# Patient Record
Sex: Female | Born: 1950
Health system: Southern US, Community
[De-identification: ages and names within clinical notes are randomized; demographics above are authoritative.]

## PROBLEM LIST (undated history)

## (undated) DIAGNOSIS — Z808 Family history of malignant neoplasm of other organs or systems: Secondary | ICD-10-CM

## (undated) DIAGNOSIS — T4145XA Adverse effect of unspecified anesthetic, initial encounter: Secondary | ICD-10-CM

## (undated) DIAGNOSIS — R112 Nausea with vomiting, unspecified: Secondary | ICD-10-CM

## (undated) DIAGNOSIS — Z131 Encounter for screening for diabetes mellitus: Secondary | ICD-10-CM

## (undated) DIAGNOSIS — M5030 Other cervical disc degeneration, unspecified cervical region: Secondary | ICD-10-CM

## (undated) DIAGNOSIS — F0390 Unspecified dementia without behavioral disturbance: Secondary | ICD-10-CM

## (undated) DIAGNOSIS — N63 Unspecified lump in unspecified breast: Secondary | ICD-10-CM

## (undated) DIAGNOSIS — I10 Essential (primary) hypertension: Secondary | ICD-10-CM

## (undated) DIAGNOSIS — Z801 Family history of malignant neoplasm of trachea, bronchus and lung: Secondary | ICD-10-CM

## (undated) DIAGNOSIS — Z800 Family history of malignant neoplasm of digestive organs: Secondary | ICD-10-CM

## (undated) DIAGNOSIS — T8859XA Other complications of anesthesia, initial encounter: Secondary | ICD-10-CM

## (undated) DIAGNOSIS — Z9889 Other specified postprocedural states: Secondary | ICD-10-CM

## (undated) DIAGNOSIS — B382 Pulmonary coccidioidomycosis, unspecified: Secondary | ICD-10-CM

## (undated) DIAGNOSIS — H409 Unspecified glaucoma: Secondary | ICD-10-CM

## (undated) DIAGNOSIS — Z803 Family history of malignant neoplasm of breast: Secondary | ICD-10-CM

## (undated) DIAGNOSIS — Z923 Personal history of irradiation: Secondary | ICD-10-CM

## (undated) DIAGNOSIS — F329 Major depressive disorder, single episode, unspecified: Secondary | ICD-10-CM

## (undated) DIAGNOSIS — G4730 Sleep apnea, unspecified: Secondary | ICD-10-CM

## (undated) DIAGNOSIS — R911 Solitary pulmonary nodule: Secondary | ICD-10-CM

## (undated) HISTORY — DX: Family history of malignant neoplasm of trachea, bronchus and lung: Z80.1

## (undated) HISTORY — DX: Encounter for screening for diabetes mellitus: Z13.1

## (undated) HISTORY — PX: UVULOPALATOPHARYNGOPLASTY: SHX827

## (undated) HISTORY — DX: Family history of malignant neoplasm of digestive organs: Z80.0

## (undated) HISTORY — DX: Family history of malignant neoplasm of other organs or systems: Z80.8

## (undated) HISTORY — DX: Unspecified dementia, unspecified severity, without behavioral disturbance, psychotic disturbance, mood disturbance, and anxiety: F03.90

## (undated) HISTORY — DX: Sleep apnea, unspecified: G47.30

## (undated) HISTORY — DX: Unspecified glaucoma: H40.9

## (undated) HISTORY — DX: Other cervical disc degeneration, unspecified cervical region: M50.30

## (undated) HISTORY — PX: CHOLECYSTECTOMY: SHX55

## (undated) HISTORY — DX: Essential (primary) hypertension: I10

## (undated) HISTORY — PX: LAPAROSCOPIC GASTRIC SLEEVE RESECTION: SHX5895

## (undated) HISTORY — DX: Family history of malignant neoplasm of breast: Z80.3

## (undated) HISTORY — PX: TUBAL LIGATION: SHX77

## (undated) HISTORY — DX: Pulmonary coccidioidomycosis, unspecified: B38.2

## (undated) HISTORY — DX: Major depressive disorder, single episode, unspecified: F32.9

## (undated) HISTORY — DX: Morbid (severe) obesity due to excess calories: E66.01

## (undated) HISTORY — PX: ABDOMINAL HYSTERECTOMY: SHX81

## (undated) NOTE — *Deleted (*Deleted)
SURVIVORSHIP VIRTUAL VISIT:  I connected with *** on 01/09/20 at  8:30 AM EDT by *** and verified that I am speaking with the correct person using two identifiers.  I discussed the limitations, risks, security and privacy concerns of performing an evaluation and management service by telephone and the availability of in person appointments. I also discussed with the patient that there may be a patient responsible charge related to this service. The patient expressed understanding and agreed to proceed.   BRIEF ONCOLOGIC HISTORY:  Oncology History  Ductal carcinoma in situ (DCIS) of right breast  12/27/2018 Initial Diagnosis   Patient noted bilateral dark red spontaneous nipple discharge and a left breast lump palpable on exam. Mammogram showed calcifications in the right breast spanning 3.68mm and no correlate for the previously palpable left breast lump. Breast MRI showed no evidence of malignancy bilaterally. Right breast biopsy showed intermediate grade DCIS, ER+ 100%, PR+ 70%.    03/01/2019 Genetic Testing   Negative genetic testing:  No pathogenic variants detected on the [facility] Breast Cancer STAT panel or the [facility] Multi-Cancer panel. A variant of uncertain significance was detected in the APC gene called c.449A>G. The report date is 03/01/2019.  The Breast Cancer STAT panel offered by [facility] includes sequencing and rearrangement analysis for the following 9 genes:  ATM, BRCA1, BRCA2, CDH1, CHEK2, PALB2, PTEN, STK11 and TP53.  The Multi-Cancer Panel offered by [facility] includes sequencing and/or deletion duplication testing of the following 85 genes: AIP, ALK, APC, ATM, AXIN2,BAP1,  BARD1, BLM, BMPR1A, BRCA1, BRCA2, BRIP1, CASR, CDC73, CDH1, CDK4, CDKN1B, CDKN1C, CDKN2A (p14ARF), CDKN2A (p16INK4a), CEBPA, CHEK2, CTNNA1, DICER1, DIS3L2, EGFR (c.2369C>T, p.Thr790Met variant only), EPCAM (Deletion/duplication testing only), FH, FLCN, GATA2, GPC3, GREM1 (Promoter region deletion/duplication testing  only), HOXB13 (c.251G>A, p.Gly84Glu), HRAS, KIT, MAX, MEN1, MET, MITF (c.952G>A, p.Glu318Lys variant only), MLH1, MSH2, MSH3, MSH6, MUTYH, NBN, NF1, NF2, NTHL1, PALB2, PDGFRA, PHOX2B, PMS2, POLD1, POLE, POT1, PRKAR1A, PTCH1, PTEN, RAD50, RAD51C, RAD51D, RB1, RECQL4, RET, RNF43, RUNX1, SDHAF2, SDHA (sequence changes only), SDHB, SDHC, SDHD, SMAD4, SMARCA4, SMARCB1, SMARCE1, STK11, SUFU, TERC, TERT, TMEM127, TP53, TSC1, TSC2, VHL, WRN and WT1.     04/28/2019 Cancer Staging   Staging form: Breast, AJCC 8th Edition - Pathologic stage from 04/28/2019: Stage 0 (pTis (DCIS), pN0, cM0, ER+, PR+)   04/28/2019 Surgery   Right lumpectomy [name]) [phone]): intermediate grade DCIS, clear margins. No regional lymph nodes were examined.   06/16/2019 - 07/11/2019 Radiation Therapy   Radiation at [facility].   07/2019 - 07/2024 Anti-estrogen oral therapy   Tamoxifen     INTERVAL HISTORY:  Ms. Polcyn to review her survivorship care plan detailing her treatment course for breast cancer, as well as monitoring long-term side effects of that treatment, education regarding health maintenance, screening, and overall wellness and health promotion.     Overall, Ms. Blumberg reports feeling quite well.  She continues on Tamoxifen daily and is tolerating it moderately well.  She completed a survivorship survey today.    REVIEW OF SYSTEMS:  Review of Systems  Constitutional: Negative for appetite change, chills, fatigue, fever and unexpected weight change.  HENT:   Negative for hearing loss, lump/mass, sore throat and trouble swallowing.   Eyes: Negative for eye problems and icterus.  Respiratory: Negative for chest tightness, cough and shortness of breath.   Cardiovascular: Negative for chest pain, leg swelling and palpitations.  Gastrointestinal: Negative for abdominal distention, abdominal pain, constipation, diarrhea, nausea and vomiting.  Endocrine: Negative for hot flashes.  Genitourinary: Negative for  difficulty  urinating.   Musculoskeletal: Negative for arthralgias.  Skin: Negative for itching and rash.  Neurological: Negative for dizziness, extremity weakness, headaches and numbness.  Hematological: Negative for adenopathy. Does not bruise/bleed easily.  Psychiatric/Behavioral: Negative for depression. The patient is not nervous/anxious.    Breast: Denies any new nodularity, masses, tenderness, nipple changes, or nipple discharge.      ONCOLOGY TREATMENT TEAM:  1. Surgeon:  Dr. [name] at [facility] 2. Medical Oncologist: Dr. Pamelia Hoit  3. Radiation completed in [place]    PAST MEDICAL/SURGICAL HISTORY:  Past Medical History:  Diagnosis Date  . Breast mass 12/27/2018  . Coccidioidomycosis, pulmonary (HCC)   . Complication of anesthesia   . DDD (degenerative disc disease), cervical   . Dementia (HCC)    Early per Dr. Juanetta Gosling  . Depression 2009  . DM (diabetes mellitus screen) 2009  . Family history of bone cancer   . Family history of brain cancer   . Family history of breast cancer   . Family history of esophageal cancer   . Family history of lung cancer   . Glaucoma   . HTN (hypertension)   . Morbid obesity (HCC)   . Nodule of upper lobe of left lung 12/27/2018  . PONV (postoperative nausea and vomiting)   . Sleep apnea    had surgery  . Type II diabetes mellitus, uncontrolled (HCC) 01/31/2019   Past Surgical History:  Procedure Laterality Date  . ABDOMINAL HYSTERECTOMY     TAH BSO  . BREAST BIOPSY Right   . BREAST LUMPECTOMY WITH RADIOACTIVE SEED LOCALIZATION Right 04/28/2019   Procedure: RIGHT BREAST LUMPECTOMY WITH RADIOACTIVE SEED LOCALIZATION;  Surgeon: Griselda Miner, MD;  Location: [facility];  Service: General;  Laterality: Right;  . CHOLECYSTECTOMY    . TUBAL LIGATION    . UVULOPALATOPHARYNGOPLASTY       ALLERGIES:  Allergies  Allergen Reactions  . Oxycodone-Acetaminophen Other (See Comments), Itching, Rash and Hives    Other  reaction(s): Hallucinations  . Propoxyphene Other (See Comments), Hives, Itching and Rash    Other reaction(s): Delusions (intolerance)  . Demerol [Meperidine] Other (See Comments)    hallucinations  . Morphine And Related Hives  . Oxycodone-Aspirin Other (See Comments) and Hives  . Chlorhexidine Rash     CURRENT MEDICATIONS:  Outpatient Encounter Medications as of 01/11/2020  Medication Sig  . aspirin 81 MG tablet Take 1 tablet by mouth daily.  . Calcium Carbonate-Vit D-Min (CALCIUM 1200 PO) Take 1,200 mg by mouth 2 (two) times daily.  . Cholecalciferol (VITAMIN D-3) 25 MCG (1000 UT) CAPS Take 3 capsules by mouth daily.  . insulin aspart (NOVOLOG) 100 UNIT/ML injection Inject 5-15 Units into the skin 3 (three) times daily with meals. 5-15 units on sliding scale   . insulin glargine (LANTUS) 100 UNIT/ML injection Inject 60 Units into the skin at bedtime.   Marland Kitchen lisinopril-hydrochlorothiazide (ZESTORETIC) 10-12.5 MG tablet Take 1 tablet by mouth daily.  . metFORMIN (GLUCOPHAGE-XR) 500 MG 24 hr tablet Take 500 mg by mouth daily with breakfast.  . Multiple Vitamins-Minerals (MULTIVITAMIN ADULT PO) Take by mouth daily.  . Pediatric Multivitamins-Iron Kirke Corin W/IRON PO) Take 2 tablets by mouth daily.  . tamoxifen (NOLVADEX) 20 MG tablet Take 1 tablet (20 mg total) by mouth daily.  . traMADol (ULTRAM) 50 MG tablet Take 1-2 tablets (50-100 mg total) by mouth every 6 (six) hours as needed.  . venlafaxine XR (EFFEXOR-XR) 75 MG 24 hr capsule Take 75 mg  by mouth daily.  . vitamin B-12 (CYANOCOBALAMIN) 500 MCG tablet Take 1,000 mcg by mouth 2 (two) times daily.   No facility-administered encounter medications on file as of 01/11/2020.     ONCOLOGIC FAMILY HISTORY:  Family History  Problem Relation Age of Onset  . Heart disease Mother        "enlarged heart and leaking valves"  . COPD Mother   . Diabetes Mother   . Breast cancer Mother 29  . Cancer Mother        cancer involving the  colon, esophagus, and liver  . Peripheral vascular disease Father 56  . Heart disease Father   . Diabetes Maternal Grandfather   . Polycystic kidney disease Sister   . Polycystic kidney disease Brother   . Hypertension Daughter   . Breast cancer Maternal Grandmother 80  . Breast cancer Other 72       maternal grandmother's sister  . Brain cancer Other 68  . Lung cancer Other      GENETIC COUNSELING/TESTING: See above  SOCIAL HISTORY:  Social History   Socioeconomic History  . Marital status: Widowed    Spouse name: Not on file  . Number of children: 3  . Years of education: Not on file  . Highest education level: Not on file  Occupational History  . Occupation: Retired-Daycare  Tobacco Use  . Smoking status: Never Smoker  . Smokeless tobacco: Never Used  Vaping Use  . Vaping Use: Never used  Substance and Sexual Activity  . Alcohol use: Yes    Comment: social  . Drug use: No  . Sexual activity: Not Currently    Birth control/protection: Surgical  Other Topics Concern  . Not on file  Social History Narrative   Lives at home with children. Married for 48 years.Lives with husband.Retired.   Social Determinants of Health   Financial Resource Strain:   . Difficulty of Paying Living Expenses: Not on file  Food Insecurity:   . Worried About Programme researcher, broadcasting/film/video in the Last Year: Not on file  . Ran Out of Food in the Last Year: Not on file  Transportation Needs:   . Lack of Transportation (Medical): Not on file  . Lack of Transportation (Non-Medical): Not on file  Physical Activity:   . Days of Exercise per Week: Not on file  . Minutes of Exercise per Session: Not on file  Stress:   . Feeling of Stress : Not on file  Social Connections:   . Frequency of Communication with Friends and Family: Not on file  . Frequency of Social Gatherings with Friends and Family: Not on file  . Attends Religious Services: Not on file  . Active Member of Clubs or Organizations: Not  on file  . Attends Banker Meetings: Not on file  . Marital Status: Not on file  Intimate Partner Violence:   . Fear of Current or Ex-Partner: Not on file  . Emotionally Abused: Not on file  . Physically Abused: Not on file  . Sexually Abused: Not on file     OBSERVATIONS/OBJECTIVE:  There were no vitals taken for this visit. GENERAL: Patient is a well appearing female in no acute distress HEENT:  Sclerae anicteric.  Oropharynx clear and moist. No ulcerations or evidence of oropharyngeal candidiasis. Neck is supple.  NODES:  No cervical, supraclavicular, or axillary lymphadenopathy palpated.  BREAST EXAM:  Right breast s/p lumpectomy and radiation therapy, no sign of local recurrence, left breast benign  LUNGS:  Clear to auscultation bilaterally.  No wheezes or rhonchi. HEART:  Regular rate and rhythm. No murmur appreciated. ABDOMEN:  Soft, nontender.  Positive, normoactive bowel sounds. No organomegaly palpated. MSK:  No focal spinal tenderness to palpation. Full range of motion bilaterally in the upper extremities. EXTREMITIES:  No peripheral edema.   SKIN:  Clear with no obvious rashes or skin changes. No nail dyscrasia. NEURO:  Nonfocal. Well oriented.  Appropriate affect.    LABORATORY DATA:  None for this visit.  DIAGNOSTIC IMAGING:  None for this visit.      ASSESSMENT AND PLAN:  Ms.. Ulrey is a pleasant 36 y.o. female with Stage *** right/left breast invasive ductal carcinoma, ER+/PR+/HER2-, diagnosed in ***, treated with lumpectomy, adjuvant radiation therapy, and anti-estrogen therapy with *** beginning in ***.  She presents to the Survivorship Clinic for our initial meeting and routine follow-up post-completion of treatment for breast cancer.    1. Stage *** right/left breast cancer:  Ms. Feild is continuing to recover from definitive treatment for breast cancer. She will follow-up with her medical oncologist, Dr. Lucrezia Starch in *** with history  and physical exam per surveillance protocol.  She will continue her anti-estrogen therapy with ***. Thus far, she is tolerating the *** well, with minimal side effects. She was instructed to make Dr. Pamelia Hoit or myself aware if she begins to experience any worsening side effects of the medication and I could see her back in clinic to help manage those side effects, as needed. Her mammogram is due ***; orders placed today.  Her breast density is category ***. Today, a comprehensive survivorship care plan and treatment summary was reviewed with the patient today detailing her breast cancer diagnosis, treatment course, potential late/long-term effects of treatment, appropriate follow-up care with recommendations for the future, and patient education resources.  A copy of this summary, along with a letter will be sent to the patient's primary care provider via mail/fax/In Basket message after today's visit.    #. Problem(s) at Visit______________  #. Bone health:  Given Ms. Tomei age/history of breast cancer and her current treatment regimen including anti-estrogen therapy with ***, she is at risk for bone demineralization.  Her last DEXA scan was ***, which showed ***.  In the meantime, she was encouraged to increase her consumption of foods rich in calcium, as well as increase her weight-bearing activities.  She was given education on specific activities to promote bone health.  #. Cancer screening:  Due to Ms. Doering history and her age, she should receive screening for skin cancers, colon cancer, and gynecologic cancers.  The information and recommendations are listed on the patient's comprehensive care plan/treatment summary and were reviewed in detail with the patient.    #. Health maintenance and wellness promotion: Ms. Soman was encouraged to consume 5-7 servings of fruits and vegetables per day. We reviewed the "Nutrition Rainbow" handout, as well as the handout "Take Control of Your Health and Reduce  Your Cancer Risk" from the American Cancer Society.  She was also encouraged to engage in moderate to vigorous exercise for 30 minutes per day most days of the week. We discussed the [employer] fitness program, which is designed for cancer survivors to help them become more physically fit after cancer treatments.  She was instructed to limit her alcohol consumption and continue to abstain from tobacco use/***was encouraged stop smoking.     #. Support services/counseling: It is not uncommon for this period of the patient's cancer care  trajectory to be one of many emotions and stressors.  We discussed how this can be increasingly difficult during the times of quarantine and social distancing due to the COVID-19 pandemic.   She was given information regarding our available services and encouraged to contact me with any questions or for help enrolling in any of our support group/programs.    Follow up instructions:    -Return to cancer center ***  -Mammogram due in *** -Follow up with surgery *** -She is welcome to return back to the Survivorship Clinic at any time; no additional follow-up needed at this time.  -Consider referral back to survivorship as a long-term survivor for continued surveillance  The patient was provided an opportunity to ask questions and all were answered. The patient agreed with the plan and demonstrated an understanding of the instructions.   Total encounter time: *** minutes  Lillard Anes, NP 01/09/20 8:44 AM Medical Oncology and Hematology [facility] [address] [place], [place] [address] Tel. [phone]    Fax. [phone]  *Total Encounter Time as defined by the Centers for Medicare and Medicaid Services includes, in addition to the face-to-face time of a patient visit (documented in the note above) non-face-to-face time: obtaining and reviewing outside history, ordering and reviewing medications, tests or procedures, care coordination  (communications with other health care professionals or caregivers) and documentation in the medical record.

---
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
## 2000-08-17 ENCOUNTER — Emergency Department (HOSPITAL_COMMUNITY): Admission: EM | Admit: 2000-08-17 | Discharge: 2000-08-17 | Payer: Self-pay | Admitting: *Deleted

## 2000-12-04 ENCOUNTER — Ambulatory Visit: Admission: RE | Admit: 2000-12-04 | Discharge: 2000-12-04 | Payer: Self-pay | Admitting: Otolaryngology

## 2001-01-18 ENCOUNTER — Encounter: Payer: Self-pay | Admitting: Otolaryngology

## 2001-01-20 ENCOUNTER — Observation Stay (HOSPITAL_COMMUNITY): Admission: RE | Admit: 2001-01-20 | Discharge: 2001-01-22 | Payer: Self-pay | Admitting: Otolaryngology

## 2001-01-20 ENCOUNTER — Encounter (INDEPENDENT_AMBULATORY_CARE_PROVIDER_SITE_OTHER): Payer: Self-pay | Admitting: *Deleted

## 2001-01-28 ENCOUNTER — Encounter (INDEPENDENT_AMBULATORY_CARE_PROVIDER_SITE_OTHER): Payer: Self-pay | Admitting: Specialist

## 2001-01-28 ENCOUNTER — Observation Stay (HOSPITAL_COMMUNITY): Admission: EM | Admit: 2001-01-28 | Discharge: 2001-01-30 | Payer: Self-pay | Admitting: Emergency Medicine

## 2001-06-29 ENCOUNTER — Emergency Department (HOSPITAL_COMMUNITY): Admission: EM | Admit: 2001-06-29 | Discharge: 2001-06-29 | Payer: Self-pay | Admitting: Emergency Medicine

## 2001-06-29 ENCOUNTER — Ambulatory Visit (HOSPITAL_COMMUNITY): Admission: RE | Admit: 2001-06-29 | Discharge: 2001-06-29 | Payer: Self-pay | Admitting: Pulmonary Disease

## 2001-07-20 ENCOUNTER — Ambulatory Visit (HOSPITAL_COMMUNITY): Admission: RE | Admit: 2001-07-20 | Discharge: 2001-07-20 | Payer: Self-pay | Admitting: Pulmonary Disease

## 2001-07-20 ENCOUNTER — Encounter: Payer: Self-pay | Admitting: Emergency Medicine

## 2001-07-20 ENCOUNTER — Emergency Department (HOSPITAL_COMMUNITY): Admission: EM | Admit: 2001-07-20 | Discharge: 2001-07-20 | Payer: Self-pay | Admitting: Emergency Medicine

## 2001-08-13 ENCOUNTER — Ambulatory Visit (HOSPITAL_COMMUNITY): Admission: RE | Admit: 2001-08-13 | Discharge: 2001-08-13 | Payer: Self-pay | Admitting: Cardiology

## 2001-08-19 ENCOUNTER — Ambulatory Visit (HOSPITAL_COMMUNITY): Admission: RE | Admit: 2001-08-19 | Discharge: 2001-08-19 | Payer: Self-pay | Admitting: Internal Medicine

## 2001-08-20 ENCOUNTER — Ambulatory Visit (HOSPITAL_COMMUNITY): Admission: RE | Admit: 2001-08-20 | Discharge: 2001-08-20 | Payer: Self-pay | Admitting: Pulmonary Disease

## 2001-08-23 ENCOUNTER — Encounter: Payer: Self-pay | Admitting: Internal Medicine

## 2001-08-23 ENCOUNTER — Ambulatory Visit (HOSPITAL_COMMUNITY): Admission: RE | Admit: 2001-08-23 | Discharge: 2001-08-23 | Payer: Self-pay | Admitting: Internal Medicine

## 2002-09-24 ENCOUNTER — Encounter: Payer: Self-pay | Admitting: Emergency Medicine

## 2002-09-24 ENCOUNTER — Emergency Department (HOSPITAL_COMMUNITY): Admission: EM | Admit: 2002-09-24 | Discharge: 2002-09-24 | Payer: Self-pay | Admitting: Emergency Medicine

## 2003-11-22 ENCOUNTER — Emergency Department (HOSPITAL_COMMUNITY): Admission: EM | Admit: 2003-11-22 | Discharge: 2003-11-22 | Payer: Self-pay | Admitting: Emergency Medicine

## 2003-11-29 ENCOUNTER — Encounter (HOSPITAL_COMMUNITY): Admission: RE | Admit: 2003-11-29 | Discharge: 2003-12-15 | Payer: Self-pay | Admitting: Preventative Medicine

## 2004-02-13 ENCOUNTER — Ambulatory Visit (HOSPITAL_COMMUNITY): Admission: RE | Admit: 2004-02-13 | Discharge: 2004-02-13 | Payer: Self-pay | Admitting: Family Medicine

## 2005-10-08 ENCOUNTER — Ambulatory Visit (HOSPITAL_COMMUNITY): Admission: RE | Admit: 2005-10-08 | Discharge: 2005-10-08 | Payer: Self-pay | Admitting: Family Medicine

## 2007-03-18 DIAGNOSIS — F32A Depression, unspecified: Secondary | ICD-10-CM

## 2007-03-18 DIAGNOSIS — Z131 Encounter for screening for diabetes mellitus: Secondary | ICD-10-CM

## 2007-03-18 HISTORY — DX: Encounter for screening for diabetes mellitus: Z13.1

## 2007-03-18 HISTORY — DX: Depression, unspecified: F32.A

## 2009-11-22 ENCOUNTER — Ambulatory Visit (HOSPITAL_COMMUNITY): Admission: RE | Admit: 2009-11-22 | Discharge: 2009-11-22 | Payer: Self-pay | Admitting: Family Medicine

## 2010-01-30 ENCOUNTER — Inpatient Hospital Stay (HOSPITAL_COMMUNITY): Admission: EM | Admit: 2010-01-30 | Discharge: 2010-02-05 | Payer: Self-pay | Admitting: Emergency Medicine

## 2010-01-30 ENCOUNTER — Ambulatory Visit: Payer: Self-pay | Admitting: Cardiovascular Disease

## 2010-02-01 ENCOUNTER — Ambulatory Visit: Payer: Self-pay | Admitting: Internal Medicine

## 2010-02-01 ENCOUNTER — Encounter: Payer: Self-pay | Admitting: Cardiology

## 2010-02-04 ENCOUNTER — Other Ambulatory Visit: Payer: Self-pay | Admitting: Cardiology

## 2010-04-06 ENCOUNTER — Encounter: Payer: Self-pay | Admitting: Family Medicine

## 2010-05-28 LAB — COMPREHENSIVE METABOLIC PANEL
ALT: 15 U/L (ref 0–35)
AST: 16 U/L (ref 0–37)
AST: 20 U/L (ref 0–37)
Albumin: 3 g/dL — ABNORMAL LOW (ref 3.5–5.2)
Alkaline Phosphatase: 62 U/L (ref 39–117)
CO2: 27 mEq/L (ref 19–32)
Calcium: 8.6 mg/dL (ref 8.4–10.5)
Chloride: 104 mEq/L (ref 96–112)
Creatinine, Ser: 0.68 mg/dL (ref 0.4–1.2)
GFR calc Af Amer: >60 mL/min (ref >60)
GFR calc non Af Amer: >60 mL/min (ref >60)
Glucose, Bld: 256 mg/dL — ABNORMAL HIGH (ref 70–99)
Potassium: 4 mEq/L (ref 3.5–5.1)
Sodium: 139 mEq/L (ref 135–145)

## 2010-05-28 LAB — BASIC METABOLIC PANEL
BUN: 16 mg/dL (ref 6–23)
BUN: 17 mg/dL (ref 6–23)
CO2: 25 mEq/L (ref 19–32)
CO2: 26 mEq/L (ref 19–32)
CO2: 31 mEq/L (ref 19–32)
Calcium: 9.1 mg/dL (ref 8.4–10.5)
Chloride: 100 mEq/L (ref 96–112)
Chloride: 106 mEq/L (ref 96–112)
Chloride: 97 mEq/L (ref 96–112)
Creatinine, Ser: 0.61 mg/dL (ref 0.4–1.2)
Creatinine, Ser: 0.71 mg/dL (ref 0.4–1.2)
Creatinine, Ser: 0.76 mg/dL (ref 0.4–1.2)
GFR calc Af Amer: >60 mL/min (ref >60)
Glucose, Bld: 247 mg/dL — ABNORMAL HIGH (ref 70–99)
Glucose, Bld: 421 mg/dL — ABNORMAL HIGH (ref 70–99)
Potassium: 3.6 mEq/L (ref 3.5–5.1)

## 2010-05-28 LAB — LIPID PANEL
Cholesterol: 184 mg/dL (ref 0–200)
HDL: 44 mg/dL (ref >39)
LDL Cholesterol: 78 mg/dL (ref 0–99)
Total CHOL/HDL Ratio: 4.2 RATIO

## 2010-05-28 LAB — CBC
HCT: 39.7 % (ref 36.0–46.0)
HCT: 40.6 % (ref 36.0–46.0)
Hemoglobin: 14.1 g/dL (ref 12.0–15.0)
Hemoglobin: 14.1 g/dL (ref 12.0–15.0)
MCH: 32.4 pg (ref 26.0–34.0)
MCH: 32.5 pg (ref 26.0–34.0)
MCH: 32.5 pg (ref 26.0–34.0)
MCH: 32.7 pg (ref 26.0–34.0)
MCHC: 34.9 g/dL (ref 30.0–36.0)
MCHC: 35.2 g/dL (ref 30.0–36.0)
MCV: 92.1 fL (ref 78.0–100.0)
MCV: 92.9 fL (ref 78.0–100.0)
MCV: 94.1 fL (ref 78.0–100.0)
Platelets: 198 10*3/uL (ref 150–400)
Platelets: 216 10*3/uL (ref 150–400)
Platelets: 225 10*3/uL (ref 150–400)
Platelets: 230 10*3/uL (ref 150–400)
RBC: 4.29 MIL/uL (ref 3.87–5.11)
RBC: 4.32 MIL/uL (ref 3.87–5.11)
RBC: 4.41 MIL/uL (ref 3.87–5.11)
RBC: 4.69 MIL/uL (ref 3.87–5.11)
RDW: 12.4 % (ref 11.5–15.5)
RDW: 12.6 % (ref 11.5–15.5)
WBC: 5.8 10*3/uL (ref 4.0–10.5)
WBC: 6.7 10*3/uL (ref 4.0–10.5)

## 2010-05-28 LAB — GLUCOSE, CAPILLARY
Glucose-Capillary: 122 mg/dL — ABNORMAL HIGH (ref 70–99)
Glucose-Capillary: 163 mg/dL — ABNORMAL HIGH (ref 70–99)
Glucose-Capillary: 186 mg/dL — ABNORMAL HIGH (ref 70–99)
Glucose-Capillary: 212 mg/dL — ABNORMAL HIGH (ref 70–99)
Glucose-Capillary: 213 mg/dL — ABNORMAL HIGH (ref 70–99)
Glucose-Capillary: 219 mg/dL — ABNORMAL HIGH (ref 70–99)
Glucose-Capillary: 237 mg/dL — ABNORMAL HIGH (ref 70–99)
Glucose-Capillary: 241 mg/dL — ABNORMAL HIGH (ref 70–99)
Glucose-Capillary: 263 mg/dL — ABNORMAL HIGH (ref 70–99)
Glucose-Capillary: 270 mg/dL — ABNORMAL HIGH (ref 70–99)
Glucose-Capillary: 292 mg/dL — ABNORMAL HIGH (ref 70–99)
Glucose-Capillary: 300 mg/dL — ABNORMAL HIGH (ref 70–99)
Glucose-Capillary: 302 mg/dL — ABNORMAL HIGH (ref 70–99)
Glucose-Capillary: 307 mg/dL — ABNORMAL HIGH (ref 70–99)
Glucose-Capillary: 314 mg/dL — ABNORMAL HIGH (ref 70–99)
Glucose-Capillary: 353 mg/dL — ABNORMAL HIGH (ref 70–99)
Glucose-Capillary: 89 mg/dL (ref 70–99)

## 2010-05-28 LAB — CARDIAC PANEL(CRET KIN+CKTOT+MB+TROPI)
CK, MB: 2.1 ng/mL (ref 0.3–4.0)
Relative Index: INVALID (ref 0.0–2.5)
Total CK: 47 U/L (ref 7–177)
Total CK: 49 U/L (ref 7–177)

## 2010-05-28 LAB — HEMOGLOBIN A1C: Hgb A1c MFr Bld: 11.7 % — ABNORMAL HIGH (ref <5.7)

## 2010-05-28 LAB — DIFFERENTIAL
Eosinophils Absolute: 0 10*3/uL (ref 0.0–0.7)
Lymphs Abs: 2.2 10*3/uL (ref 0.7–4.0)
Monocytes Relative: 8 % (ref 3–12)
Neutrophils Relative %: 58 % (ref 43–77)

## 2010-05-28 LAB — D-DIMER, QUANTITATIVE: D-Dimer, Quant: 0.42 ug/mL-FEU (ref 0.00–0.48)

## 2010-08-02 NOTE — Procedures (Signed)
[facility]  Patient:    MELIKA, REDER Visit Number: [id number] MRN: [id number]          Service Type: EMS Location: ED Attending Physician:  Annamarie Dawley Dictated by:   Kari Baars, M.D. Proc. Date: 07/20/01 Admit Date:  07/20/2001 Discharge Date: 07/20/2001                                Stress Test  PROCEDURE:  Grated exercise stress test  CARDIOLOGIST:  Kari Baars, M.D.  INDICATIONS:  Ms. Haggar is undergoing a grated exercise testing because of chest discomfort.  She also has hypertension.  She is undergoing a grated exercise testing to rule out ischemic cardiac disease.  There are no contraindications to the grated exercise testing.  DESCRIPTION OF PROCEDURE:  Ms. Finigan exercised for 6 minutes on the Bruce protocol, reaching and sustaining 7.0 METS.  Her maximum recorded heart rate was 149, which is 87% of her age-predicted maximal heart rate.  Her blood pressure response to exercise was normal.  She had no chest pain during exercise and had no electrocardiographic changes, suggestive of inducible ischemia.  There were occasional PVCs prior to, during, and post-exercise.  IMPRESSION 1. Fair exercise tolerance. 2. Normal blood pressure response to exercise. 3. No evidence of inducible ischemia. 4. No chest pain with exercise. Dictated by:   Kari Baars, M.D. Attending Physician:  Annamarie Dawley DD:  07/20/01 TD:  07/20/01 Job: [id number]

## 2010-08-02 NOTE — Procedures (Signed)
[facility]  Patient:    Miranda Fuller, Miranda Fuller Visit Number: [id number] MRN: [id number]          Service Type: END Location: DAY Attending Physician:  Jonathon Bellows Dictated by:   [place] Bing, M.D. Proc. Date: 08/13/01 Admit Date:  08/19/2001   CC:         Kari Baars, M.D.   Echocardiograms                         STRESS ECHOCARDIOGRAM  REFERRING PHYSICIAN:  Kari Baars, M.D.  CLINICAL DATA:  A 60 year old woman with multiple cardiovascular risk factors and chest pain; negative coronary angiography in 4/98.  1. Upright treadmill exercise performed to a work load of 7 METS and a heart    rate of 160, 94% of age-predicted maximum.  Sharp chest pain occurred early    in the protocol, but resolved with further effort. 2. No significant arrhythmias noted. 3. Baseline blood pressure of 150/95 increased to 190/95 during exercise and    200/100 in recovery, a very mildly hypertensive response. 4. EKG: Normal sinus rhythm; within normal limits. 5. Stress EKG: Insignificant upsloping ST segment depression. 6. Echocardiogram: Limited images obtained.  Right ventricular size and    function were normal.  The mitral valve was slightly thickened with mild    annular calcification and normal function.  Normal tricuspid valve.  Normal    left ventricular size and function without significant LVH. 7. Post-exercise echocardiogram: Hyperdynamic function in all myocardial    segments.  IMPRESSION:  Negative stress echocardiogram with impaired exercise capacity, a negative stress EKG, no structural cardiac abnormalities and no echocardiographic evidence for myocardial ischemia or infarction. Dictated by:   Smoot Bing, M.D. Attending Physician:  Jonathon Bellows DD:  08/13/01 TD:  08/16/01 Job: [id number]

## 2010-08-02 NOTE — Op Note (Signed)
[facility]  Patient:    Miranda Fuller, Miranda Fuller Visit Number: [id number] MRN: [id number]          Service Type: END Location: DAY Attending Physician:  Jonathon Bellows Dictated by:   Roetta Sessions, M.D. Proc. Date: 08/19/01 Admit Date:  08/19/2001 Discharge Date: 08/19/2001   CC:         Kari Baars, M.D.   Operative Report  PROCEDURE:  Diagnostic esophagogastroduodenoscopy.  ENDOSCOPIST:  Roetta Sessions, M.D.  INDICATIONS FOR PROCEDURE:  Patient is a morbidly obese 60 year old lady with a two-month history of chest pain.  She also has typical reflux symptoms for which the latter have been improved with Aciphex 20 mg orally daily.  Chest pain has not been improved.  She is status post cholecystectomy a couple of years ago for what sounds like biliary dyskinesia.  She does not have odynophagia, dysphagia, early satiety, reflux symptoms, nausea, vomiting, melena, or rectal bleeding.  EGD is now being done to further evaluate chest pain.  She is referred at the courtesy of Dr. Kari Baars.  Please see my handwritten H&P for more information.  She is a low risk for conscious sedation.  PROCEDURE:  O2 saturation, blood pressure, pulse, and respirations were monitored throughout the entire procedure.  CONSCIOUS SEDATION:  IV Versed and Demerol in incremental doses.  INSTRUMENT:  Olympus video gastroscope.  FINDINGS:  Examination of the tubular esophagus revealed no mucosal abnormalities.  EG junction was easily traversed.  Stomach:  Gastric cavity was emptied and insufflated well with air for examination of the gastric mucosa including retroflexed view of the proximal stomach.  Esophagogastric junction demonstrated no abnormalities.  Pylorus was patent and easily traversed.  Duodenum:  The bulb and second portion appeared normal. Therapeutic/diagnostic maneuvers:  None.  Patient tolerated the procedures well and was reactive in  endoscopy.  IMPRESSION:  Normal esophagogastroduodenoscopy.  No endoscopic explanation for patients symptoms.  RECOMMENDATIONS:  Abdominal ultrasound, rule out biliary dilation, LFT, CBC, amylase, and sed rate today.  Further recommendations to follow. Dictated by:   Roetta Sessions, M.D. Attending Physician:  Jonathon Bellows DD:  08/19/01 TD:  08/23/01 Job: [id number]

## 2010-08-02 NOTE — Op Note (Signed)
[facility]  Patient:    Miranda Fuller, Miranda Fuller Visit Number: [id number] MRN: [id number]          Service Type: MED Location: [phone] Attending Physician:  Lucky Cowboy Dictated by:   Jeannett Senior. Pollyann Kennedy, M.D. Proc. Date: 01/28/01 Admit Date:  01/28/2001                             Operative Report  PREOPERATIVE DIAGNOSIS:  Post tonsillectomy hemorrhage.  POSTOPERATIVE DIAGNOSIS:  Post tonsillectomy hemorrhage.  PROCEDURE:  Examination under anesthesia with control of pharyngeal hemorrhage post tonsillectomy.  ADDITIONAL PROCEDURE:  Biopsy of left-sided pharyngeal mass.  DISPOSITION:  The patient tolerated the procedure well, was awakened, extubated, and transferred to recovery in stable condition.  ANESTHESIA:  General endotracheal.  COMPLICATIONS:  None.  FINDINGS:  Bleeding site left inferior tonsillar pole.  Additional findings, left-sided tonsillar fossa mass as described above, sent for pathological evaluation.  HISTORY OF PRESENT ILLNESS:  This is a 60 year old lady who underwent tonsillectomy and UP3 with septoplasty about 7 or 8 days prior.  She was admitted to the hospital about 24 hours ago because of post tonsillectomy hemorrhage, but the hemorrhage had stopped and she had not bled for about 24 hours.  She then started again a couple of hours prior to the procedure. Risks, benefits, alternatives, and complications to the procedure were explained to the patient who seemed to understand and agreed to surgery.  DESCRIPTION OF PROCEDURE:  The patient was taken to the operating room and placed on the operating room table in supine position.  Following induction of general endotracheal anesthesia, the Crowe-Davis mouthgag was inserted into the oral cavity, used to retract the tongue and mandible, attached to the Mayo stand.  Inspection of the pharynx revealed the above mentioned findings.  A large amount of blood clot was evacuated.   Suction cautery was used to coagulate the inferior tonsillar pole on the left side to control the hemorrhage.  A Herd dissector was used to dissect off the mass from the left tonsillar fossa.  The patient had a history of coccidioidomycosis, and this was sent for pathological evaluation to rule out any such infection.  It had the consistency of tough fibrotic granulation tissue.  Pharynx was suctioned of blood and secretions, irrigated with saline solution.  Orogastric tube was used to aspirate the contents of the blood, and Salem sump was actually used to irrigate the gastric contents and to evacuate old blood.  Mouthgag was released, there was no further bleeding.  The patient was then awakened, extubated, and transferred to recovery. Dictated by:   Jeannett Senior Pollyann Kennedy, M.D. Attending Physician:  Lucky Cowboy DD:  01/29/01 TD:  01/29/01 Job: [id number]

## 2010-08-02 NOTE — Op Note (Signed)
[facility]  Patient:    TY, OSHIMA Visit Number: [id number] MRN: [id number]          Service Type: SUR Location: [id number] Attending Physician:  Fernande Boyden Dictated by:   Gloris Manchester. Lazarus Salines, M.D. Proc. Date: 01/20/01 Admit Date:  01/20/2001   CC:         Dr. Sherwood Gambler in [place].  [facility]   Operative Report  PREOPERATIVE DIAGNOSIS:  Nasal septal deviation, hypertrophic inferior turbinates, obstructive sleep apnea, morbid obesity.  POSTOPERATIVE DIAGNOSIS:  Nasal septal deviation, hypertrophic inferior turbinates, obstructive sleep apnea, morbid obesity.  OPERATION PERFORMED:  Septoplasty, submucous resection of inferior turbinates, tonsillectomy, uvulopalatopharyngoplasty.  SURGEON:  Gloris Manchester. Lazarus Salines, M.D.  ANESTHESIA:  General orotracheal.  ESTIMATED BLOOD LOSS:  Minimal.  COMPLICATIONS:  None.  FINDINGS:  An overall narrow nose with a rightward septal deviation and chondroethmoid and maxillary crest spurring.  Hypertrophic inferior and middle turbinates bilaterally.  A very crowded pharynx with a bulky muscular tongue, 2+ imbedded tonsils and fleshy soft palate and uvula and posterior pharyngeal wall.  DESCRIPTION OF PROCEDURE:  With the patient in a comfortable supine position, general orotracheal anesthesia was induced without difficulty.  At an appropriate level, the table was turned 90 degrees, the patient placed in a slight sitting position.  A saline moistened throat pack was placed, nasal vibrissae were trimmed.  Cocaine crystals, 200 mg total were applied on cotton carriers to the anterior ethmoid and sphenopalatine ganglion regions on both sides.  Cocaine solution, 160 mg total was applied on 1/2 x 3 inch cottonoids to both sides of the septal mucosa.  1% Xylocaine with 1:100,000 epinephrine, 9 cc total was infiltrated into the membranous columella, into the anterior floor of the nose, into  the nasal spine region, into the submucoperichondrial plane of the septum on both sides.  Several minutes were allowed for hemostasis to take effect.  A sterile preparation and draping of the midface. The materials were removed from the nose and observed to be intact and correct in number.  The findings were as described above.  A left-sided hemitransfixion incision was sharply executed and carried down to the caudal edge of the quadrangular cartilage, continued into a small floor tunnel.  A small right floor tunnel incision was executed.  Floor tunnels were elevated on both sides to the vomer and brought forward to the maxillary crest.  The submucoperichondrial plane of the left septum was dissected straight back onto the perpendicular plate of the ethmoid down to and communicating with the floor tunnel and then brought forward.  The left septal flap was raised intact.  The chondroethmoid junction was identified and was opened with a Risk analyst.  The opposite submucoperiosteal plane of the perpendicular plate was elevated.  The superior perpendicular plate was lysed with an open Jansen-Middleton forceps to avoid rocking the cribriform plate region.  The inferior portion was submucosally dissected and rocked free with a closed Jansen-Middleton forceps.  Additional spicules of cartilage and bone along the maxillary crest and vomer were submucosally dissected and removed with the Takahashi forceps.  The posterior, inferior quadrangular cartilage was incised and removed and a 2 mm strip of heavy spurring along the inferior edge of the quadrangular cartilage was likewise submucosally removed.  This allowed the septum to trapdoor onto the maxillary crest.  The maxillary crest was quite prominent and asymmetric and it was trimmed with the mallet and osteotome. The septum was brought onto  the maxillary crest and secured thereto with a figure-of-eight 4-0 PDS suture.  Good midline approximation  of the septum with dorsal support and a decent airway on both sides was at this point noted.  The septal tunnels were suctioned free and the incisions were all closed with interrupted 4-0 chromic suture.  Just prior to completing the septoplasty, the inferior turbinates were infiltrated with a total of 6 cc of 1% Xylocaine with 1:100,000 epinephrine. Beginning on the right side, the anterior hood of the inferior turbinate was lysed just behind the nasal valve region.  The medial mucosa was incised in an anterior upsloping fashion.  The laterally based flap was elevated.  The turbinate was infractured.  The turbinate bone and lateral mucosa were resected and a posterior down-sloping fashion taking virtually all of the anterior pole and leaving virtually all of the posterior pole.  Bony spicules were carefully dissected and removed.  The cut mucosal edges and the bulky posterior pole of the turbinate was suction coagulated for hemostasis.  The flap was laid back down.  The turbinate was outfractured and this side was completed.  The left side was done in identical fashion.  0.030 reinforced Silastic splints were fashioned and placed against the septal mucosa on both sides and secured thereto with a 3-0 nylon stitch.  Double thickness Neosporin impregnated Telfa packs were applied along the inferior turbinate on each side.  6.5 mm nasal trumpets were shortened to reach into the nasopharynx, lubricated and passed into the nasopharynx to serve as a minor postoperative airway and to help with the nasal packing.  This completed the nasal portion of the procedure.  Hemostasis was observed.  The patient was at this point placed in Trendelenburg position.  The pharynx was suctioned free and the throat pack was removed.  Taking care to protect lips, teeth, and endotracheal tube, the Crowe-Davis mouth gag was introduced, expanded for visualization, and suspended from the Mayo stand in the  standard  fashion.  The findings were as described above.  0.5% Xylocaine with 1:200,000 epinephrine was infiltrated into the peritonsillar plane, 8 cc total, several minutes were allowed for hemostasis to take effect.  The tattooed mark on the soft palate was identified.  A broad square arch was marked through the palatal tattoo and carried down on to the anterior tonsillar pillars using the cautery.  Dissection was begun along this line.  Working on the left tonsil, it was grasped and retracted medially.  The mucosa overlying the anterior pole was cut and carried down into the pharynx beneath the tonsil.  Working from inferiorly upward, the tonsil was dissected free of muscular plane from inferiorly upward.  Cautery tip was used as a blunt dissector, lysing fibrous bands and coagulating crossing vessels as identified.  The tonsil remained pedicled by its superior pole.  Some portion of the anterior and posterior pillar was removed with the tonsil.  The right tonsil was addressed in the same fashion, the anterior pillar was incised and the dissection was carried down beneath the tonsil toward the pharynx and the tonsil was brought upward.  Upon having both tonsils pedicled by the superior pole of the attachment to the soft palate. The uvula was grasped and retracted directly inferiorly.  The mucosa was cut across the soft palate, leaving a longer posterior flap to be reapproximated on the anterior surface.  The soft palate, uvula and both tonsils were at this point removed en bloc.  A small additional quantity of cautery rendered  the fossa hemostatic.  Beginning in the midline, the soft palate and posterior mucosa was brought forward and reapproximated to the anterior mucosa with 3-0 Vicryl sutures. The posterior surface was brought laterally to make a more square configuration of the nasopharyngeal introitus.  The posterior pharyngeal wall mucosa was relatively narrow and was more  readily closed to the right and to the left owing to some preservation of the posterior pillar and mucosa.  A shallow mucosal relaxing incision was made vertically in the midline to enhance the closure.  Hemostasis was achieved with cautery.  After closing the arch, mirror examination revealed a fairly narrow fleshy nasopharyngeal introitus but improved from preoperatively.  The mouth gag was relaxed for several minutes.  Upon re-expansion, hemostasis was persistent.  At this point the procedure was completed.  The mouth gag was relaxed and removed.  The dental status was intact.  The patient was then returned to anesthesia, awakened, extubated, and transferred to recovery in stable condition.  COMMENT:  A 60 year old white female with a history of obesity, multiple sleep disorders, among them sleep with a narrow nose, poor breathing and fleshy pharynx were the indications for the several components of todays procedure. Anticipate a routine postoperative recovery with attention to ice, elevation, analgesia, antibiosis.  Will observe her in the stepdown unit in the hospital for any airway compromise, anticipate getting her home in 1 to 2 days. Dictated by:   Gloris Manchester. Lazarus Salines, M.D. Attending Physician:  Fernande Boyden DD:  01/20/01 TD:  01/21/01 Job: [id number]

## 2011-03-25 ENCOUNTER — Other Ambulatory Visit (HOSPITAL_COMMUNITY): Payer: Self-pay | Admitting: Family Medicine

## 2011-03-25 DIAGNOSIS — Z139 Encounter for screening, unspecified: Secondary | ICD-10-CM

## 2011-03-31 ENCOUNTER — Ambulatory Visit (HOSPITAL_COMMUNITY): Payer: Self-pay

## 2011-04-01 ENCOUNTER — Ambulatory Visit (HOSPITAL_COMMUNITY): Payer: Self-pay

## 2011-04-22 ENCOUNTER — Ambulatory Visit (HOSPITAL_COMMUNITY)
Admission: RE | Admit: 2011-04-22 | Discharge: 2011-04-22 | Disposition: A | Discharge status: Home / Self Care | Payer: PRIVATE HEALTH INSURANCE | Source: Ambulatory Visit | Attending: Family Medicine | Admitting: Family Medicine

## 2011-04-22 DIAGNOSIS — Z139 Encounter for screening, unspecified: Secondary | ICD-10-CM

## 2011-04-22 DIAGNOSIS — Z1231 Encounter for screening mammogram for malignant neoplasm of breast: Secondary | ICD-10-CM | POA: Insufficient documentation

## 2013-05-13 ENCOUNTER — Other Ambulatory Visit (HOSPITAL_COMMUNITY): Payer: Self-pay | Admitting: Pulmonary Disease

## 2013-05-13 DIAGNOSIS — Z780 Asymptomatic menopausal state: Secondary | ICD-10-CM

## 2013-05-13 DIAGNOSIS — Z139 Encounter for screening, unspecified: Secondary | ICD-10-CM

## 2013-05-19 ENCOUNTER — Ambulatory Visit (HOSPITAL_COMMUNITY)
Admission: RE | Admit: 2013-05-19 | Discharge: 2013-05-19 | Disposition: A | Discharge status: Home / Self Care | Payer: Medicare Other | Source: Ambulatory Visit | Attending: Pulmonary Disease | Admitting: Pulmonary Disease

## 2013-05-19 DIAGNOSIS — Z780 Asymptomatic menopausal state: Secondary | ICD-10-CM | POA: Insufficient documentation

## 2013-05-19 DIAGNOSIS — Z139 Encounter for screening, unspecified: Secondary | ICD-10-CM

## 2013-05-19 DIAGNOSIS — M949 Disorder of cartilage, unspecified: Principal | ICD-10-CM

## 2013-05-19 DIAGNOSIS — M899 Disorder of bone, unspecified: Secondary | ICD-10-CM | POA: Insufficient documentation

## 2013-05-19 DIAGNOSIS — Z1231 Encounter for screening mammogram for malignant neoplasm of breast: Secondary | ICD-10-CM | POA: Insufficient documentation

## 2013-05-24 ENCOUNTER — Other Ambulatory Visit: Payer: Self-pay | Admitting: Pulmonary Disease

## 2013-05-24 DIAGNOSIS — R928 Other abnormal and inconclusive findings on diagnostic imaging of breast: Secondary | ICD-10-CM

## 2013-06-03 ENCOUNTER — Telehealth (HOSPITAL_COMMUNITY): Payer: Self-pay | Admitting: Dietician

## 2013-06-03 NOTE — Telephone Encounter (Signed)
Received consult via fax from Dr. Luan Pulling for dx: diabetes

## 2013-06-09 ENCOUNTER — Encounter (HOSPITAL_COMMUNITY): Payer: Self-pay | Admitting: Dietician

## 2013-06-09 NOTE — Telephone Encounter (Signed)
Spoke with pt on 06/07/13, after receiving voicemail from her on 06/06/13. Reported that this RD has received referral for her and pt plans to attend DM edu class on 06/09/13. She also wants individual appointment for weight loss and notified pt of current 4-6 week appointment time wait due to current workflow. She reports she is will be going out of town in April and May and will set up individual appointment when she returned.

## 2013-06-09 NOTE — Progress Notes (Signed)
[facility] Diabetes Class Completion  Date:June 09, 2013  Time: 1730  Pt attended [facility]'s Diabetes [facility] Education Class on June 09, 2013.   Patient was educated on the following topics:   -Survival skills (signs and symptoms of hyperglycemia and hypoglycemia, treatment for hypoglycemia, ideal levels for fasting and postprandial blood sugars, goal Hgb A1c level, foot care basics)  -Recommendations for physical activity   -Carbohydrate metabolism in relation to diabetes   -Meal planning (sources of carbohydrate, carbohydrate counting, meal planning strategies, food label reading, and portion control).  Handouts provided:  -"Diabetes and You: Taking Charge of Your Health"  -"Carbohydrate Counting and Meal Planning"  -"Your Guide to Better Office Visits"   Jolly Bleicher A. Rubyann Lingle, RD, LDN  

## 2013-06-15 ENCOUNTER — Ambulatory Visit (HOSPITAL_COMMUNITY)
Admission: RE | Admit: 2013-06-15 | Discharge: 2013-06-15 | Disposition: A | Discharge status: Home / Self Care | Payer: Medicare Other | Source: Ambulatory Visit | Attending: Pulmonary Disease | Admitting: Pulmonary Disease

## 2013-06-15 ENCOUNTER — Encounter (HOSPITAL_COMMUNITY): Payer: Medicare Other

## 2013-06-15 ENCOUNTER — Other Ambulatory Visit: Payer: Self-pay | Admitting: Pulmonary Disease

## 2013-06-15 ENCOUNTER — Other Ambulatory Visit (HOSPITAL_COMMUNITY): Payer: Self-pay | Admitting: Pulmonary Disease

## 2013-06-15 DIAGNOSIS — R0602 Shortness of breath: Secondary | ICD-10-CM | POA: Insufficient documentation

## 2013-06-15 DIAGNOSIS — R928 Other abnormal and inconclusive findings on diagnostic imaging of breast: Secondary | ICD-10-CM

## 2013-06-15 DIAGNOSIS — R079 Chest pain, unspecified: Secondary | ICD-10-CM | POA: Insufficient documentation

## 2013-06-15 DIAGNOSIS — N6009 Solitary cyst of unspecified breast: Secondary | ICD-10-CM | POA: Insufficient documentation

## 2013-12-02 ENCOUNTER — Other Ambulatory Visit (HOSPITAL_COMMUNITY): Payer: Self-pay | Admitting: Pulmonary Disease

## 2013-12-02 DIAGNOSIS — M545 Low back pain, unspecified: Secondary | ICD-10-CM

## 2013-12-07 ENCOUNTER — Ambulatory Visit (HOSPITAL_COMMUNITY): Payer: Medicare Other

## 2014-09-01 DIAGNOSIS — N390 Urinary tract infection, site not specified: Secondary | ICD-10-CM | POA: Diagnosis not present

## 2014-09-27 DIAGNOSIS — R079 Chest pain, unspecified: Secondary | ICD-10-CM | POA: Diagnosis not present

## 2014-09-27 DIAGNOSIS — E119 Type 2 diabetes mellitus without complications: Secondary | ICD-10-CM | POA: Diagnosis not present

## 2014-09-27 DIAGNOSIS — R5383 Other fatigue: Secondary | ICD-10-CM | POA: Diagnosis not present

## 2014-09-27 DIAGNOSIS — G4730 Sleep apnea, unspecified: Secondary | ICD-10-CM | POA: Diagnosis not present

## 2014-09-27 DIAGNOSIS — I10 Essential (primary) hypertension: Secondary | ICD-10-CM | POA: Diagnosis not present

## 2014-09-28 DIAGNOSIS — R079 Chest pain, unspecified: Secondary | ICD-10-CM | POA: Diagnosis not present

## 2014-09-28 DIAGNOSIS — I10 Essential (primary) hypertension: Secondary | ICD-10-CM | POA: Diagnosis not present

## 2014-09-28 DIAGNOSIS — M7552 Bursitis of left shoulder: Secondary | ICD-10-CM | POA: Diagnosis not present

## 2014-09-28 DIAGNOSIS — E119 Type 2 diabetes mellitus without complications: Secondary | ICD-10-CM | POA: Diagnosis not present

## 2014-09-28 DIAGNOSIS — G4730 Sleep apnea, unspecified: Secondary | ICD-10-CM | POA: Diagnosis not present

## 2014-10-31 ENCOUNTER — Telehealth: Payer: Self-pay | Admitting: Cardiology

## 2014-10-31 NOTE — Telephone Encounter (Signed)
Received records from Dr Sinda Du for appointment on 11/24/14 with Dr Percival Spanish.  Records given to [facility] (medical records) for Dr Hochrein's schedule on 11/24/14. lp

## 2014-11-08 ENCOUNTER — Ambulatory Visit: Payer: PRIVATE HEALTH INSURANCE | Admitting: Cardiovascular Disease

## 2014-11-24 ENCOUNTER — Encounter: Payer: Self-pay | Admitting: Cardiology

## 2014-11-24 ENCOUNTER — Ambulatory Visit (INDEPENDENT_AMBULATORY_CARE_PROVIDER_SITE_OTHER): Payer: Medicare Other | Admitting: Cardiology

## 2014-11-24 VITALS — BP 174/96 | HR 86 | Ht 59.0 in | Wt 233.0 lb

## 2014-11-24 DIAGNOSIS — R079 Chest pain, unspecified: Secondary | ICD-10-CM

## 2014-11-24 DIAGNOSIS — R072 Precordial pain: Secondary | ICD-10-CM | POA: Diagnosis not present

## 2014-11-24 NOTE — Patient Instructions (Signed)
Your physician recommends that you schedule a follow-up appointment in: As Needed  Your physician has requested that you have an exercise tolerance test. For further information please visit [email or web address]. Please also follow instruction sheet, as given.    

## 2014-11-24 NOTE — Progress Notes (Signed)
Cardiology Office Note   Date:  11/24/2014   ID:  Miranda Fuller, DOB 1950/06/14, MRN [id number]  PCP:  Alonza Bogus, MD  Cardiologist:   Minus Breeding, MD   Chief Complaint  Patient presents with  . New Evaluation  . Dizziness      History of Present Illness: Miranda Fuller is a 64 y.o. female who presents for  Evaluation of chest discomfort. She has had a cardiac history with stress testing and I reviewed 2003 in 2011. She had no ischemia in 2011 but she ended up with a heart catheterization. I was able to review these records as well. She had a well preserved ejection fraction. She had some minimal plaque but no obstructive disease. She returns now for follow-up. She says she has been getting some chest discomfort. This has been occurring for several weeks. It might last for 2 minutes at a time. She points to the left mid upper chest. She describes it as a pinching. It might be heavy as well. It seems to be worse if she's moving or deep breathing. It can be 7 out of 10. He comes on spontaneously goes away spontaneously. She might get diaphoretic but doesn't have any acute shortness of breath. She's not describing any palpitations with this and is not causing any presyncope or syncope. She does do some walking but doesn't necessarily come on with this activity. She's not sure whether she's had this before. It seems to be a somewhat stable pattern. She also reports having had a not come up in the left side of her neck but disappeared spontaneously. This was a few weeks ago.    Past Medical History  Diagnosis Date  . Coccidioidomycosis, pulmonary   . DM (diabetes mellitus screen)   . HTN (hypertension)   . DDD (degenerative disc disease), cervical   . Dementia     Early per Dr. Luan Pulling  . Sleep apnea     Past Surgical History  Procedure Laterality Date  . Tubal ligation    . Abdominal hysterectomy    . Cholecystectomy    . Uvulopalatopharyngoplasty       Current  Outpatient Prescriptions  Medication Sig Dispense Refill  . Calcium Carbonate-Vit D-Min (CALCIUM 1200 PO) Take 1,200 mg by mouth 2 (two) times daily.    . insulin aspart (NOVOLOG) 100 UNIT/ML injection Inject into the skin once. 5-15 units on sliding scale    . insulin glargine (LANTUS) 100 UNIT/ML injection Inject 56 Units into the skin at bedtime.    . Multiple Vitamins-Minerals (MULTIVITAMIN ADULT PO) Take by mouth daily.    . Vitamin D, Cholecalciferol, 1000 UNITS TABS Take 1,000 mg by mouth 2 (two) times daily.     No current facility-administered medications for this visit.    Allergies:   Demerol and Morphine and related    Social History:  The patient  reports that she has never smoked. She has never used smokeless tobacco. She reports that she drinks alcohol. She reports that she does not use illicit drugs.   Family History:  The patient's family history includes Heart disease in her mother; Peripheral vascular disease (age of onset: 53) in her father; Polycystic kidney disease in her sister.    ROS:  Please see the history of present illness.   Otherwise, review of systems are positive for none.   All other systems are reviewed and negative.    PHYSICAL EXAM: VS:  BP 174/96 mmHg  Pulse 86  Ht 4\' 11"  (1.499 m)  Wt 233 lb (105.688 kg)  BMI 47.04 kg/m2 , BMI Body mass index is 47.04 kg/(m^2). GENERAL:  Well appearing HEENT:  Pupils equal round and reactive, fundi not visualized, oral mucosa unremarkable NECK:  No jugular venous distention, waveform within normal limits, carotid upstroke brisk and symmetric, no bruits, no thyromegaly LYMPHATICS:  No cervical, inguinal adenopathy LUNGS:  Clear to auscultation bilaterally BACK:  No CVA tenderness CHEST:  Unremarkable HEART:  PMI not displaced or sustained,S1 and S2 within normal limits, no S3, no S4, no clicks, no rubs, no murmurs ABD:  Flat, positive bowel sounds normal in frequency in pitch, no bruits, no rebound, no  guarding, no midline pulsatile mass, no hepatomegaly, no splenomegaly EXT:  2 plus pulses throughout, no edema, no cyanosis no clubbing SKIN:  No rashes no nodules NEURO:  Cranial nerves II through XII grossly intact, motor grossly intact throughout PSYCH:  Cognitively intact, oriented to person place and time    EKG:  EKG is ordered today. The ekg ordered today demonstrates  Sinus rhythm, rate 86, left axis deviation , intervals within normal limits, no acute ST-T wave changes.   Recent Labs: No results found for requested labs within last 365 days.    Lipid Panel    Component Value Date/Time   CHOL  01/30/2010 0305    184        ATP III CLASSIFICATION:  <200     mg/dL   Desirable  200-239  mg/dL   Borderline High  >=240    mg/dL   High          TRIG 308* 01/30/2010 0305   HDL 44 01/30/2010 0305   CHOLHDL 4.2 01/30/2010 0305   VLDL 62* 01/30/2010 0305   LDLCALC  01/30/2010 0305    78        Total Cholesterol/HDL:CHD Risk Coronary Heart Disease Risk Table                     Men   Women  1/2 Average Risk   3.4   3.3  Average Risk       5.0   4.4  2 X Average Risk   9.6   7.1  3 X Average Risk  23.4   11.0        Use the calculated Patient Ratio above and the CHD Risk Table to determine the patient's CHD Risk.        ATP III CLASSIFICATION (LDL):  <100     mg/dL   Optimal  100-129  mg/dL   Near or Above                    Optimal  130-159  mg/dL   Borderline  160-189  mg/dL   High  >190     mg/dL   Very High      Wt Readings from Last 3 Encounters:  11/24/14 233 lb (105.688 kg)      Other studies Reviewed: Additional studies/ records that were reviewed today include: Old records (extensive review). Review of the above records demonstrates:  Please see elsewhere in the note.     ASSESSMENT AND PLAN:  CHEST PAIN:   Her pain is somewhat atypical. However, she seems to have significant risk factors. She does have some joint problems but I think she would  be a walk on a treadmill. I will bring the patient back for a POET (Plain Old Exercise  Test). This will allow me to screen for obstructive coronary disease, risk stratify and very importantly provide a prescription for exercise.   This is negative I will refer her back to HAWKINS,EDWARD L, MD for further evaluation.  HTN:    Her blood pressure is typically not this elevated. She needs to keep an eye on this and keep a blood pressure diary. She may need med titration. She needs continued therapeutic lifestyle changes as well. Patient include weight loss.  MORBID OBESITY:  The patient understands the need to lose weight with diet and exercise. We have discussed specific strategies for this.    Current medicines are reviewed at length with the patient today.  The patient does not have concerns regarding medicines.  The following changes have been made:  no change  Labs/ tests ordered today include:   Orders Placed This Encounter  Procedures  . Exercise Tolerance Test  . EKG 12-Lead     Disposition:   FU with as needed.      Signed, Minus Breeding, MD  11/24/2014 1:26 PM    [facility]

## 2014-11-29 DIAGNOSIS — Z0000 Encounter for general adult medical examination without abnormal findings: Secondary | ICD-10-CM | POA: Diagnosis not present

## 2014-12-27 ENCOUNTER — Telehealth (HOSPITAL_COMMUNITY): Payer: Self-pay

## 2014-12-27 NOTE — Telephone Encounter (Signed)
Encounter complete. 

## 2014-12-29 ENCOUNTER — Ambulatory Visit (HOSPITAL_COMMUNITY)
Admission: RE | Admit: 2014-12-29 | Discharge: 2014-12-29 | Disposition: A | Discharge status: Home / Self Care | Payer: Medicare Other | Source: Ambulatory Visit | Attending: Cardiology | Admitting: Cardiology

## 2014-12-29 DIAGNOSIS — R079 Chest pain, unspecified: Secondary | ICD-10-CM | POA: Insufficient documentation

## 2014-12-29 DIAGNOSIS — R9439 Abnormal result of other cardiovascular function study: Secondary | ICD-10-CM | POA: Diagnosis not present

## 2014-12-29 LAB — EXERCISE TOLERANCE TEST
CHL CUP MPHR: 158 {beats}/min
CSEPEW: 7 METS
CSEPHR: 96 %
CSEPPHR: 153 {beats}/min
Exercise duration (min): 6 min
RPE: 16
Rest HR: 90 {beats}/min

## 2015-10-15 DIAGNOSIS — K210 Gastro-esophageal reflux disease with esophagitis: Secondary | ICD-10-CM | POA: Diagnosis not present

## 2015-10-15 DIAGNOSIS — E1165 Type 2 diabetes mellitus with hyperglycemia: Secondary | ICD-10-CM | POA: Diagnosis not present

## 2015-10-15 DIAGNOSIS — R079 Chest pain, unspecified: Secondary | ICD-10-CM | POA: Diagnosis not present

## 2015-10-15 DIAGNOSIS — I10 Essential (primary) hypertension: Secondary | ICD-10-CM | POA: Diagnosis not present

## 2015-11-28 DIAGNOSIS — I10 Essential (primary) hypertension: Secondary | ICD-10-CM | POA: Diagnosis not present

## 2015-11-28 DIAGNOSIS — E119 Type 2 diabetes mellitus without complications: Secondary | ICD-10-CM | POA: Diagnosis not present

## 2015-11-28 DIAGNOSIS — G4733 Obstructive sleep apnea (adult) (pediatric): Secondary | ICD-10-CM | POA: Insufficient documentation

## 2015-11-29 ENCOUNTER — Other Ambulatory Visit (HOSPITAL_COMMUNITY): Payer: Self-pay | Admitting: Pulmonary Disease

## 2015-11-29 DIAGNOSIS — Z1231 Encounter for screening mammogram for malignant neoplasm of breast: Secondary | ICD-10-CM

## 2015-11-29 DIAGNOSIS — Z0000 Encounter for general adult medical examination without abnormal findings: Secondary | ICD-10-CM | POA: Diagnosis not present

## 2015-11-29 DIAGNOSIS — Z780 Asymptomatic menopausal state: Secondary | ICD-10-CM

## 2015-12-03 ENCOUNTER — Ambulatory Visit (HOSPITAL_COMMUNITY)
Admission: RE | Admit: 2015-12-03 | Discharge: 2015-12-03 | Disposition: A | Discharge status: Home / Self Care | Payer: Medicare Other | Source: Ambulatory Visit | Attending: Pulmonary Disease | Admitting: Pulmonary Disease

## 2015-12-03 ENCOUNTER — Other Ambulatory Visit (HOSPITAL_COMMUNITY): Payer: Medicare Other

## 2015-12-03 DIAGNOSIS — I10 Essential (primary) hypertension: Secondary | ICD-10-CM | POA: Diagnosis not present

## 2015-12-03 DIAGNOSIS — Z780 Asymptomatic menopausal state: Secondary | ICD-10-CM | POA: Diagnosis not present

## 2015-12-03 DIAGNOSIS — E1169 Type 2 diabetes mellitus with other specified complication: Secondary | ICD-10-CM | POA: Diagnosis not present

## 2015-12-03 DIAGNOSIS — M85861 Other specified disorders of bone density and structure, right lower leg: Secondary | ICD-10-CM | POA: Diagnosis not present

## 2015-12-03 DIAGNOSIS — Z13820 Encounter for screening for osteoporosis: Secondary | ICD-10-CM | POA: Diagnosis not present

## 2015-12-03 DIAGNOSIS — M8580 Other specified disorders of bone density and structure, unspecified site: Secondary | ICD-10-CM | POA: Insufficient documentation

## 2015-12-03 DIAGNOSIS — Z1231 Encounter for screening mammogram for malignant neoplasm of breast: Secondary | ICD-10-CM | POA: Diagnosis not present

## 2015-12-03 DIAGNOSIS — G4730 Sleep apnea, unspecified: Secondary | ICD-10-CM | POA: Diagnosis not present

## 2015-12-18 DIAGNOSIS — Z1211 Encounter for screening for malignant neoplasm of colon: Secondary | ICD-10-CM | POA: Diagnosis not present

## 2015-12-18 DIAGNOSIS — R1013 Epigastric pain: Secondary | ICD-10-CM | POA: Diagnosis not present

## 2015-12-18 DIAGNOSIS — R1310 Dysphagia, unspecified: Secondary | ICD-10-CM | POA: Diagnosis not present

## 2015-12-18 DIAGNOSIS — K219 Gastro-esophageal reflux disease without esophagitis: Secondary | ICD-10-CM | POA: Diagnosis not present

## 2016-04-09 DIAGNOSIS — I10 Essential (primary) hypertension: Secondary | ICD-10-CM | POA: Diagnosis not present

## 2016-04-09 DIAGNOSIS — E1100 Type 2 diabetes mellitus with hyperosmolarity without nonketotic hyperglycemic-hyperosmolar coma (NKHHC): Secondary | ICD-10-CM | POA: Diagnosis not present

## 2016-04-09 DIAGNOSIS — Z794 Long term (current) use of insulin: Secondary | ICD-10-CM | POA: Diagnosis not present

## 2016-04-14 DIAGNOSIS — Z7189 Other specified counseling: Secondary | ICD-10-CM | POA: Diagnosis not present

## 2016-04-16 DIAGNOSIS — Z713 Dietary counseling and surveillance: Secondary | ICD-10-CM | POA: Diagnosis not present

## 2016-05-09 DIAGNOSIS — E1100 Type 2 diabetes mellitus with hyperosmolarity without nonketotic hyperglycemic-hyperosmolar coma (NKHHC): Secondary | ICD-10-CM | POA: Diagnosis not present

## 2016-05-09 DIAGNOSIS — G4733 Obstructive sleep apnea (adult) (pediatric): Secondary | ICD-10-CM | POA: Diagnosis not present

## 2016-05-09 DIAGNOSIS — I10 Essential (primary) hypertension: Secondary | ICD-10-CM | POA: Diagnosis not present

## 2016-05-28 ENCOUNTER — Other Ambulatory Visit (HOSPITAL_COMMUNITY): Payer: Self-pay | Admitting: Pulmonary Disease

## 2016-05-28 DIAGNOSIS — R0602 Shortness of breath: Secondary | ICD-10-CM

## 2016-05-28 DIAGNOSIS — I10 Essential (primary) hypertension: Secondary | ICD-10-CM | POA: Diagnosis not present

## 2016-05-28 DIAGNOSIS — E1165 Type 2 diabetes mellitus with hyperglycemia: Secondary | ICD-10-CM | POA: Diagnosis not present

## 2016-05-28 DIAGNOSIS — G4730 Sleep apnea, unspecified: Secondary | ICD-10-CM | POA: Diagnosis not present

## 2016-06-06 DIAGNOSIS — I10 Essential (primary) hypertension: Secondary | ICD-10-CM | POA: Diagnosis not present

## 2016-06-06 DIAGNOSIS — G4733 Obstructive sleep apnea (adult) (pediatric): Secondary | ICD-10-CM | POA: Diagnosis not present

## 2016-06-06 DIAGNOSIS — E119 Type 2 diabetes mellitus without complications: Secondary | ICD-10-CM | POA: Diagnosis not present

## 2016-06-10 ENCOUNTER — Ambulatory Visit (HOSPITAL_COMMUNITY)
Admission: RE | Admit: 2016-06-10 | Discharge: 2016-06-10 | Disposition: A | Discharge status: Home / Self Care | Payer: Medicare Other | Source: Ambulatory Visit | Attending: Pulmonary Disease | Admitting: Pulmonary Disease

## 2016-06-10 DIAGNOSIS — I700 Atherosclerosis of aorta: Secondary | ICD-10-CM | POA: Insufficient documentation

## 2016-06-10 DIAGNOSIS — K449 Diaphragmatic hernia without obstruction or gangrene: Secondary | ICD-10-CM | POA: Diagnosis not present

## 2016-06-10 DIAGNOSIS — R0602 Shortness of breath: Secondary | ICD-10-CM | POA: Diagnosis not present

## 2016-06-10 DIAGNOSIS — I2510 Atherosclerotic heart disease of native coronary artery without angina pectoris: Secondary | ICD-10-CM | POA: Diagnosis not present

## 2016-06-10 DIAGNOSIS — R911 Solitary pulmonary nodule: Secondary | ICD-10-CM | POA: Diagnosis not present

## 2016-06-10 MED ORDER — IOPAMIDOL (ISOVUE-300) INJECTION 61%
75.0000 mL | Freq: Once | INTRAVENOUS | Status: AC | PRN
  Administered 2016-06-10: 75 mL via INTRAVENOUS

## 2016-06-20 DIAGNOSIS — Z01818 Encounter for other preprocedural examination: Secondary | ICD-10-CM | POA: Diagnosis not present

## 2016-06-30 DIAGNOSIS — Z7982 Long term (current) use of aspirin: Secondary | ICD-10-CM | POA: Diagnosis not present

## 2016-06-30 DIAGNOSIS — G4733 Obstructive sleep apnea (adult) (pediatric): Secondary | ICD-10-CM | POA: Diagnosis not present

## 2016-06-30 DIAGNOSIS — I10 Essential (primary) hypertension: Secondary | ICD-10-CM | POA: Diagnosis not present

## 2016-06-30 DIAGNOSIS — Z79899 Other long term (current) drug therapy: Secondary | ICD-10-CM | POA: Diagnosis not present

## 2016-06-30 DIAGNOSIS — E119 Type 2 diabetes mellitus without complications: Secondary | ICD-10-CM | POA: Diagnosis not present

## 2016-06-30 DIAGNOSIS — Z794 Long term (current) use of insulin: Secondary | ICD-10-CM | POA: Diagnosis not present

## 2016-09-04 DIAGNOSIS — E1165 Type 2 diabetes mellitus with hyperglycemia: Secondary | ICD-10-CM | POA: Diagnosis not present

## 2016-09-04 DIAGNOSIS — I10 Essential (primary) hypertension: Secondary | ICD-10-CM | POA: Diagnosis not present

## 2016-10-27 DIAGNOSIS — Z9884 Bariatric surgery status: Secondary | ICD-10-CM | POA: Diagnosis not present

## 2016-10-27 DIAGNOSIS — Z713 Dietary counseling and surveillance: Secondary | ICD-10-CM | POA: Diagnosis not present

## 2016-11-24 DIAGNOSIS — Z903 Acquired absence of stomach [part of]: Secondary | ICD-10-CM | POA: Diagnosis not present

## 2016-11-24 DIAGNOSIS — K912 Postsurgical malabsorption, not elsewhere classified: Secondary | ICD-10-CM | POA: Diagnosis not present

## 2016-12-30 DIAGNOSIS — R110 Nausea: Secondary | ICD-10-CM | POA: Diagnosis not present

## 2016-12-30 DIAGNOSIS — K912 Postsurgical malabsorption, not elsewhere classified: Secondary | ICD-10-CM | POA: Diagnosis not present

## 2016-12-30 DIAGNOSIS — I10 Essential (primary) hypertension: Secondary | ICD-10-CM | POA: Diagnosis not present

## 2016-12-30 DIAGNOSIS — Z903 Acquired absence of stomach [part of]: Secondary | ICD-10-CM | POA: Diagnosis not present

## 2016-12-30 DIAGNOSIS — E119 Type 2 diabetes mellitus without complications: Secondary | ICD-10-CM | POA: Diagnosis not present

## 2017-03-03 ENCOUNTER — Other Ambulatory Visit (HOSPITAL_COMMUNITY): Payer: Self-pay | Admitting: Pulmonary Disease

## 2017-03-03 DIAGNOSIS — Z0000 Encounter for general adult medical examination without abnormal findings: Secondary | ICD-10-CM | POA: Diagnosis not present

## 2017-03-03 DIAGNOSIS — R300 Dysuria: Secondary | ICD-10-CM

## 2017-03-05 ENCOUNTER — Encounter (INDEPENDENT_AMBULATORY_CARE_PROVIDER_SITE_OTHER): Payer: Self-pay | Admitting: Ophthalmology

## 2017-03-12 ENCOUNTER — Ambulatory Visit (HOSPITAL_COMMUNITY)
Admission: RE | Admit: 2017-03-12 | Discharge: 2017-03-12 | Disposition: A | Discharge status: Home / Self Care | Payer: Medicare Other | Source: Ambulatory Visit | Attending: Pulmonary Disease | Admitting: Pulmonary Disease

## 2017-03-12 DIAGNOSIS — R300 Dysuria: Secondary | ICD-10-CM | POA: Diagnosis not present

## 2017-03-12 DIAGNOSIS — N281 Cyst of kidney, acquired: Secondary | ICD-10-CM | POA: Diagnosis not present

## 2017-03-18 DIAGNOSIS — R1013 Epigastric pain: Secondary | ICD-10-CM | POA: Diagnosis not present

## 2017-03-18 DIAGNOSIS — R110 Nausea: Secondary | ICD-10-CM | POA: Diagnosis not present

## 2017-03-24 DIAGNOSIS — E119 Type 2 diabetes mellitus without complications: Secondary | ICD-10-CM | POA: Diagnosis not present

## 2017-04-14 DIAGNOSIS — R1013 Epigastric pain: Secondary | ICD-10-CM | POA: Diagnosis not present

## 2017-04-23 DIAGNOSIS — R1013 Epigastric pain: Secondary | ICD-10-CM | POA: Diagnosis not present

## 2017-04-23 DIAGNOSIS — R109 Unspecified abdominal pain: Secondary | ICD-10-CM | POA: Diagnosis not present

## 2017-05-12 DIAGNOSIS — K589 Irritable bowel syndrome without diarrhea: Secondary | ICD-10-CM | POA: Diagnosis not present

## 2017-05-12 DIAGNOSIS — Z86010 Personal history of colonic polyps: Secondary | ICD-10-CM | POA: Diagnosis not present

## 2017-05-12 DIAGNOSIS — R1013 Epigastric pain: Secondary | ICD-10-CM | POA: Diagnosis not present

## 2017-09-01 DIAGNOSIS — E1165 Type 2 diabetes mellitus with hyperglycemia: Secondary | ICD-10-CM | POA: Diagnosis not present

## 2017-09-01 DIAGNOSIS — I10 Essential (primary) hypertension: Secondary | ICD-10-CM | POA: Diagnosis not present

## 2017-09-01 DIAGNOSIS — M791 Myalgia, unspecified site: Secondary | ICD-10-CM | POA: Diagnosis not present

## 2017-09-01 DIAGNOSIS — M2550 Pain in unspecified joint: Secondary | ICD-10-CM | POA: Diagnosis not present

## 2017-12-02 ENCOUNTER — Other Ambulatory Visit (HOSPITAL_COMMUNITY): Payer: Self-pay | Admitting: Pulmonary Disease

## 2017-12-02 DIAGNOSIS — Z9884 Bariatric surgery status: Secondary | ICD-10-CM | POA: Diagnosis not present

## 2017-12-02 DIAGNOSIS — I10 Essential (primary) hypertension: Secondary | ICD-10-CM | POA: Diagnosis not present

## 2017-12-02 DIAGNOSIS — Z1231 Encounter for screening mammogram for malignant neoplasm of breast: Secondary | ICD-10-CM

## 2017-12-02 DIAGNOSIS — G4733 Obstructive sleep apnea (adult) (pediatric): Secondary | ICD-10-CM | POA: Diagnosis not present

## 2017-12-04 ENCOUNTER — Inpatient Hospital Stay (HOSPITAL_COMMUNITY): Admission: RE | Admit: 2017-12-04 | Payer: Medicare Other | Source: Ambulatory Visit

## 2017-12-31 DIAGNOSIS — E119 Type 2 diabetes mellitus without complications: Secondary | ICD-10-CM | POA: Diagnosis not present

## 2017-12-31 DIAGNOSIS — I10 Essential (primary) hypertension: Secondary | ICD-10-CM | POA: Diagnosis not present

## 2017-12-31 DIAGNOSIS — E559 Vitamin D deficiency, unspecified: Secondary | ICD-10-CM | POA: Diagnosis not present

## 2017-12-31 DIAGNOSIS — R5383 Other fatigue: Secondary | ICD-10-CM | POA: Diagnosis not present

## 2018-01-07 DIAGNOSIS — E119 Type 2 diabetes mellitus without complications: Secondary | ICD-10-CM | POA: Diagnosis not present

## 2018-02-08 DIAGNOSIS — R002 Palpitations: Secondary | ICD-10-CM | POA: Diagnosis not present

## 2018-02-08 DIAGNOSIS — R0789 Other chest pain: Secondary | ICD-10-CM | POA: Diagnosis not present

## 2018-02-08 DIAGNOSIS — M542 Cervicalgia: Secondary | ICD-10-CM | POA: Diagnosis not present

## 2018-02-10 ENCOUNTER — Other Ambulatory Visit (HOSPITAL_COMMUNITY): Payer: Self-pay | Admitting: Nurse Practitioner

## 2018-02-10 DIAGNOSIS — R002 Palpitations: Secondary | ICD-10-CM

## 2018-02-12 ENCOUNTER — Other Ambulatory Visit (HOSPITAL_COMMUNITY): Payer: Medicare Other

## 2018-02-15 ENCOUNTER — Encounter: Payer: Self-pay | Admitting: Cardiovascular Disease

## 2018-02-22 ENCOUNTER — Other Ambulatory Visit (HOSPITAL_COMMUNITY): Payer: Self-pay | Admitting: Nurse Practitioner

## 2018-02-22 DIAGNOSIS — H538 Other visual disturbances: Secondary | ICD-10-CM | POA: Diagnosis not present

## 2018-02-22 DIAGNOSIS — M542 Cervicalgia: Secondary | ICD-10-CM | POA: Diagnosis not present

## 2018-02-22 DIAGNOSIS — R42 Dizziness and giddiness: Secondary | ICD-10-CM | POA: Diagnosis not present

## 2018-02-22 DIAGNOSIS — R0789 Other chest pain: Secondary | ICD-10-CM | POA: Diagnosis not present

## 2018-02-23 ENCOUNTER — Ambulatory Visit (HOSPITAL_COMMUNITY)
Admission: RE | Admit: 2018-02-23 | Discharge: 2018-02-23 | Disposition: A | Discharge status: Home / Self Care | Payer: Medicare Other | Source: Ambulatory Visit | Attending: Nurse Practitioner | Admitting: Nurse Practitioner

## 2018-02-23 DIAGNOSIS — H538 Other visual disturbances: Secondary | ICD-10-CM | POA: Insufficient documentation

## 2018-02-23 DIAGNOSIS — I119 Hypertensive heart disease without heart failure: Secondary | ICD-10-CM | POA: Diagnosis not present

## 2018-02-23 DIAGNOSIS — R42 Dizziness and giddiness: Secondary | ICD-10-CM | POA: Insufficient documentation

## 2018-02-23 DIAGNOSIS — M542 Cervicalgia: Secondary | ICD-10-CM

## 2018-02-23 DIAGNOSIS — E119 Type 2 diabetes mellitus without complications: Secondary | ICD-10-CM | POA: Diagnosis not present

## 2018-02-23 DIAGNOSIS — R079 Chest pain, unspecified: Secondary | ICD-10-CM | POA: Diagnosis not present

## 2018-02-23 DIAGNOSIS — R002 Palpitations: Secondary | ICD-10-CM | POA: Diagnosis not present

## 2018-02-23 DIAGNOSIS — F0390 Unspecified dementia without behavioral disturbance: Secondary | ICD-10-CM | POA: Diagnosis not present

## 2018-02-23 NOTE — Progress Notes (Signed)
*  PRELIMINARY RESULTS* Echocardiogram 2D Echocardiogram has been performed.  Miranda Fuller 02/23/2018, 9:22 AM

## 2018-02-24 DIAGNOSIS — J019 Acute sinusitis, unspecified: Secondary | ICD-10-CM | POA: Diagnosis not present

## 2018-02-24 DIAGNOSIS — E1165 Type 2 diabetes mellitus with hyperglycemia: Secondary | ICD-10-CM | POA: Diagnosis not present

## 2018-02-24 DIAGNOSIS — I10 Essential (primary) hypertension: Secondary | ICD-10-CM | POA: Diagnosis not present

## 2018-02-24 DIAGNOSIS — R5383 Other fatigue: Secondary | ICD-10-CM | POA: Diagnosis not present

## 2018-03-11 NOTE — Progress Notes (Deleted)
Cardiology Office Note   Date:  03/11/2018   ID:  Miranda Fuller, DOB [birth year], MRN [id number]  PCP:  Doree Albee, MD  Cardiologist:   Jenkins Rouge, MD   No chief complaint on file.     History of Present Illness: Miranda Fuller is a 67 y.o. female who presents for consultation regarding chest pain and palpitations Referred by  Dr Anastasio Champion.      Past Medical History:  Diagnosis Date  . Coccidioidomycosis, pulmonary   . DDD (degenerative disc disease), cervical   . Dementia    Early per Dr. Luan Pulling  . DM (diabetes mellitus screen)   . HTN (hypertension)   . Sleep apnea     Past Surgical History:  Procedure Laterality Date  . ABDOMINAL HYSTERECTOMY    . CHOLECYSTECTOMY    . TUBAL LIGATION    . UVULOPALATOPHARYNGOPLASTY       Current Outpatient Medications  Medication Sig Dispense Refill  . Calcium Carbonate-Vit D-Min (CALCIUM 1200 PO) Take 1,200 mg by mouth 2 (two) times daily.    . insulin aspart (NOVOLOG) 100 UNIT/ML injection Inject into the skin once. 5-15 units on sliding scale    . insulin glargine (LANTUS) 100 UNIT/ML injection Inject 56 Units into the skin at bedtime.    . Multiple Vitamins-Minerals (MULTIVITAMIN ADULT PO) Take by mouth daily.    . Vitamin D, Cholecalciferol, 1000 UNITS TABS Take 1,000 mg by mouth 2 (two) times daily.     No current facility-administered medications for this visit.     Allergies:   Demerol [meperidine] and Morphine and related    Social History:  The patient  reports that she has never smoked. She has never used smokeless tobacco. She reports current alcohol use. She reports that she does not use drugs.   Family History:  The patient's family history includes Heart disease in her mother; Peripheral vascular disease (age of onset: 65) in her father; Polycystic kidney disease in her sister.    ROS:  Please see the history of present illness.   Otherwise, review of systems are positive for {NONE  DEFAULTED:18576::"none"}.   All other systems are reviewed and negative.    PHYSICAL EXAM: VS:  There were no vitals taken for this visit. , BMI There is no height or weight on file to calculate BMI. Affect appropriate Healthy:  appears stated age 67: normal Neck supple with no adenopathy JVP normal no bruits no thyromegaly Lungs clear with no wheezing and good diaphragmatic motion Heart:  S1/S2 no murmur, no rub, gallop or click PMI normal Abdomen: benighn, BS positve, no tenderness, no AAA no bruit.  No HSM or HJR Distal pulses intact with no bruits No edema Neuro non-focal Skin warm and dry No muscular weakness    EKG:  ***   Recent Labs: No results found for requested labs within last 8760 hours.    Lipid Panel    Component Value Date/Time   CHOL  01/30/2010 0305    184        ATP III CLASSIFICATION:  <200     mg/dL   Desirable  200-239  mg/dL   Borderline High  >=240    mg/dL   High          TRIG 308 (H) 01/30/2010 0305   HDL 44 01/30/2010 0305   CHOLHDL 4.2 01/30/2010 0305   VLDL 62 (H) 01/30/2010 0305   LDLCALC  01/30/2010 0305    78  Total Cholesterol/HDL:CHD Risk Coronary Heart Disease Risk Table                     Men   Women  1/2 Average Risk   3.4   3.3  Average Risk       5.0   4.4  2 X Average Risk   9.6   7.1  3 X Average Risk  23.4   11.0        Use the calculated Patient Ratio above and the CHD Risk Table to determine the patient's CHD Risk.        ATP III CLASSIFICATION (LDL):  <100     mg/dL   Optimal  100-129  mg/dL   Near or Above                    Optimal  130-159  mg/dL   Borderline  160-189  mg/dL   High  >190     mg/dL   Very High      Wt Readings from Last 3 Encounters:  11/24/14 233 lb (105.7 kg)      Other studies Reviewed: Additional studies/ records that were reviewed today include: ***.    ASSESSMENT AND PLAN:  1.  ***   Current medicines are reviewed at length with the patient today.  The  patient {ACTIONS; HAS/DOES NOT HAVE:19233} concerns regarding medicines.  The following changes have been made:  {PLAN; NO CHANGE:13088:s}  Labs/ tests ordered today include: *** No orders of the defined types were placed in this encounter.    Disposition:   FU with ***     Signed, Jenkins Rouge, MD  03/11/2018 6:03 PM    [facility] [address], [place], [place]  [address] Phone: [phone]; Fax: [phone]

## 2018-03-15 ENCOUNTER — Ambulatory Visit: Payer: Medicare Other | Admitting: Cardiovascular Disease

## 2018-03-18 ENCOUNTER — Ambulatory Visit (INDEPENDENT_AMBULATORY_CARE_PROVIDER_SITE_OTHER): Payer: Medicare Other | Admitting: Otolaryngology

## 2018-04-01 ENCOUNTER — Ambulatory Visit: Payer: Medicare Other | Admitting: Cardiology

## 2018-04-08 ENCOUNTER — Telehealth: Payer: Self-pay | Admitting: *Deleted

## 2018-04-08 DIAGNOSIS — I10 Essential (primary) hypertension: Secondary | ICD-10-CM | POA: Diagnosis not present

## 2018-04-08 DIAGNOSIS — E1165 Type 2 diabetes mellitus with hyperglycemia: Secondary | ICD-10-CM | POA: Diagnosis not present

## 2018-04-08 DIAGNOSIS — R5383 Other fatigue: Secondary | ICD-10-CM | POA: Diagnosis not present

## 2018-04-08 DIAGNOSIS — E559 Vitamin D deficiency, unspecified: Secondary | ICD-10-CM | POA: Diagnosis not present

## 2018-04-08 DIAGNOSIS — E1169 Type 2 diabetes mellitus with other specified complication: Secondary | ICD-10-CM | POA: Diagnosis not present

## 2018-04-08 NOTE — Telephone Encounter (Signed)
-----   Message from Holley Dexter sent at 03/15/2018  9:45 AM EST ----- Regarding: want to change to Dr. Angelena Form Pt has an appt with Dr. Percival Spanish on Jan. 16, 2020 and would like to change to Dr. Angelena Form instead.  Please give her a call at number listed. Thanks, renee [phone] [address]

## 2018-04-08 NOTE — Telephone Encounter (Signed)
Pt have appt with Dr Beverlee Nims 03/02

## 2018-04-29 ENCOUNTER — Encounter: Payer: Self-pay | Admitting: Cardiovascular Disease

## 2018-05-04 ENCOUNTER — Ambulatory Visit: Payer: Medicare Other | Admitting: Cardiology

## 2018-05-17 ENCOUNTER — Encounter: Payer: Self-pay | Admitting: Cardiovascular Disease

## 2018-05-17 ENCOUNTER — Ambulatory Visit (INDEPENDENT_AMBULATORY_CARE_PROVIDER_SITE_OTHER): Payer: Medicare Other | Admitting: Cardiovascular Disease

## 2018-05-17 VITALS — BP 130/78 | HR 80 | Ht 59.0 in | Wt 220.8 lb

## 2018-05-17 DIAGNOSIS — I25118 Atherosclerotic heart disease of native coronary artery with other forms of angina pectoris: Secondary | ICD-10-CM | POA: Diagnosis not present

## 2018-05-17 LAB — BASIC METABOLIC PANEL
BUN/Creatinine Ratio: 25 (ref 12–28)
BUN: 18 mg/dL (ref 8–27)
CALCIUM: 9 mg/dL (ref 8.7–10.3)
CO2: 24 mmol/L (ref 20–29)
CREATININE: 0.72 mg/dL (ref 0.57–1.00)
Chloride: 99 mmol/L (ref 96–106)
GFR calc Af Amer: 101 mL/min/{1.73_m2} (ref >59)
GFR, EST NON AFRICAN AMERICAN: 88 mL/min/{1.73_m2} (ref >59)
GLUCOSE: 229 mg/dL — AB (ref 65–99)
Potassium: 4.5 mmol/L (ref 3.5–5.2)
SODIUM: 138 mmol/L (ref 134–144)

## 2018-05-17 MED ORDER — METOPROLOL TARTRATE 50 MG PO TABS: ORAL_TABLET | ORAL | 0 refills | Status: DC

## 2018-05-17 NOTE — Progress Notes (Signed)
Chief Complaint  Patient presents with  . New Patient (Initial Visit)    chest pain   History of Present Illness: 68 yo female with history of CAD, DM, HTN, sleep apnea, obesity, depression and early dementia here today as a new patient for the evaluation of chest pain. She was seen in 2016 by Dr. Percival Spanish. She had a cardiac cath in 2011 which showed mild luminal irregularities but no obstructive CAD.  Exercise stress test in 2016 with no ischemic EKG changes. Echo December 2019 with LVEF=60-65%, sclerotic aortic valve without stenosis.   She tells me today that she has pain in her neck when she yawns. She has chest pain at rest and with exertion. This is a sharp pain. It lasts for a few minutes. Sometimes it radiates down her left arm. There is associated dyspnea. She also has swelling in her left leg at times.   Primary Care Physician: Doree Albee, MD   Past Medical History:  Diagnosis Date  . Coccidioidomycosis, pulmonary ([place])   . DDD (degenerative disc disease), cervical   . Dementia ([place])    Early per Dr. Luan Pulling  . Depression 2009  . DM (diabetes mellitus screen) 2009  . Glaucoma   . HTN (hypertension)   . Morbid obesity ([place])   . Sleep apnea     Past Surgical History:  Procedure Laterality Date  . ABDOMINAL HYSTERECTOMY    . CHOLECYSTECTOMY    . TUBAL LIGATION    . UVULOPALATOPHARYNGOPLASTY      Current Outpatient Medications  Medication Sig Dispense Refill  . aspirin 81 MG tablet Take 1 tablet by mouth daily.    . Calcium Carbonate-Vit D-Min (CALCIUM 1200 PO) Take 1,200 mg by mouth 2 (two) times daily.    . insulin aspart (NOVOLOG) 100 UNIT/ML injection Inject into the skin once. 5-15 units on sliding scale    . insulin glargine (LANTUS) 100 UNIT/ML injection Inject 56 Units into the skin at bedtime.    Marland Kitchen lisinopril-hydrochlorothiazide (PRINZIDE,ZESTORETIC) 10-12.5 MG tablet Take 1 tablet by mouth daily.    . Magnesium (V-R MAGNESIUM) 250 MG TABS Take 400  Units by mouth daily.    . Multiple Vitamins-Minerals (MULTIVITAMIN ADULT PO) Take by mouth daily.    . NP THYROID 30 MG tablet Take 30 mg by mouth daily.    . Pediatric Multivit-Minerals-C (FLINTSTONES COMPLETE) CHEW Chew 1 tablet by mouth daily.    Marland Kitchen venlafaxine XR (EFFEXOR XR) 75 MG 24 hr capsule Take 75 mg by mouth daily.    . vitamin B-12 (CYANOCOBALAMIN) 500 MCG tablet Take 1,000 mcg by mouth 2 (two) times daily.    . Vitamin D, Cholecalciferol, 1000 UNITS TABS Take 1,000 mg by mouth 2 (two) times daily.    . metoprolol tartrate (LOPRESSOR) 50 MG tablet Take one tablet by mouth 2 hours prior to CT scan 1 tablet 0   No current facility-administered medications for this visit.     Allergies  Allergen Reactions  . Demerol [Meperidine] Other (See Comments)    hallucinations  . Morphine And Related Hives    Social History   Socioeconomic History  . Marital status: Married    Spouse name: Not on file  . Number of children: 3  . Years of education: Not on file  . Highest education level: Not on file  Occupational History  . Occupation: Retired-Daycare  Social Needs  . Financial resource strain: Not on file  . Food insecurity:    Worry: Not  on file    Inability: Not on file  . Transportation needs:    Medical: Not on file    Non-medical: Not on file  Tobacco Use  . Smoking status: Never Smoker  . Smokeless tobacco: Never Used  Substance and Sexual Activity  . Alcohol use: Yes    Comment: social  . Drug use: No  . Sexual activity: Not on file  Lifestyle  . Physical activity:    Days per week: Not on file    Minutes per session: Not on file  . Stress: Not on file  Relationships  . Social connections:    Talks on phone: Not on file    Gets together: Not on file    Attends religious service: Not on file    Active member of club or organization: Not on file    Attends meetings of clubs or organizations: Not on file    Relationship status: Not on file  . Intimate  partner violence:    Fear of current or ex partner: Not on file    Emotionally abused: Not on file    Physically abused: Not on file    Forced sexual activity: Not on file  Other Topics Concern  . Not on file  Social History Narrative   Lives at home with children.      Family History  Problem Relation Age of Onset  . Heart disease Mother        "enlarged heart and leaking valves"  . COPD Mother   . Diabetes Mother   . Breast cancer Mother   . Cancer Mother   . Peripheral vascular disease Father 37  . Heart disease Father   . Polycystic kidney disease Sister   . Hypertension Daughter     Review of Systems:  As stated in the HPI and otherwise negative.   BP 130/78   Pulse 80   Ht 4\' 11"  (1.499 m)   Wt 100.2 kg   SpO2 96%   BMI 44.60 kg/m   Physical Examination: General: Well developed, well nourished, NAD  HEENT: OP clear, mucus membranes moist  SKIN: warm, dry. No rashes. Neuro: No focal deficits  Musculoskeletal: Muscle strength 5/5 all ext  Psychiatric: Mood and affect normal  Neck: No JVD, no carotid bruits, no thyromegaly, no lymphadenopathy.  Lungs:Clear bilaterally, no wheezes, rhonci, crackles Cardiovascular: Regular rate and rhythm. No murmurs, gallops or rubs. Abdomen:Soft. Bowel sounds present. Non-tender.  Extremities: No lower extremity edema. Pulses are 2 + in the bilateral DP/PT.  EKG:  EKG is ordered today. The ekg ordered today demonstrates Sinus, rate 80 bpm.   Recent Labs: No results found for requested labs within last 8760 hours.   Lipid Panel    Component Value Date/Time   CHOL  01/30/2010 0305    184        ATP III CLASSIFICATION:  <200     mg/dL   Desirable  200-239  mg/dL   Borderline High  >=240    mg/dL   High          TRIG 308 (H) 01/30/2010 0305   HDL 44 01/30/2010 0305   CHOLHDL 4.2 01/30/2010 0305   VLDL 62 (H) 01/30/2010 0305   LDLCALC  01/30/2010 0305    78        Total Cholesterol/HDL:CHD Risk Coronary Heart  Disease Risk Table                     Men  Women  1/2 Average Risk   3.4   3.3  Average Risk       5.0   4.4  2 X Average Risk   9.6   7.1  3 X Average Risk  23.4   11.0        Use the calculated Patient Ratio above and the CHD Risk Table to determine the patient's CHD Risk.        ATP III CLASSIFICATION (LDL):  <100     mg/dL   Optimal  100-129  mg/dL   Near or Above                    Optimal  130-159  mg/dL   Borderline  160-189  mg/dL   High  >190     mg/dL   Very High     Wt Readings from Last 3 Encounters:  05/17/18 100.2 kg  11/24/14 105.7 kg     Other studies Reviewed: Additional studies/ records that were reviewed today include: . Review of the above records demonstrates:    Assessment and Plan:   1. CAD with chest pain: She has known mild CAD by cath in 2011. Now having atypical pain with some typical features. EKG does not show ischemic changes. I will arrange a gated cardiac CTA to exclude progression of CAD since her cath in 2011. BMET today.    Current medicines are reviewed at length with the patient today.  The patient does not have concerns regarding medicines.  The following changes have been made:  no change  Labs/ tests ordered today include:   Orders Placed This Encounter  Procedures  . CT CORONARY MORPH W/CTA COR W/SCORE W/CA W/CM &/OR WO/CM  . CT CORONARY FRACTIONAL FLOW RESERVE DATA PREP  . CT CORONARY FRACTIONAL FLOW RESERVE FLUID ANALYSIS  . Basic Metabolic Panel (BMET)  . EKG 12-Lead     Disposition:   FU with me in 3 months   Signed, Lauree Chandler, MD 05/17/2018 10:29 AM    [facility] [place], [place], [place]  [address] Phone: [phone]; Fax: [phone]

## 2018-05-17 NOTE — Patient Instructions (Addendum)
Medication Instructions:  Your physician recommends that you continue on your current medications as directed. Please refer to the Current Medication list given to you today.  If you need a refill on your cardiac medications before your next appointment, please call your pharmacy.   Lab work: Lab work to be done today--BMP If you have labs (blood work) drawn today and your tests are completely normal, you will receive your results only by: Marland Kitchen MyChart Message (if you have MyChart) OR . A paper copy in the mail If you have any lab test that is abnormal or we need to change your treatment, we will call you to review the results.  Testing/Procedures: Your physician has requested that you have cardiac CT. Cardiac computed tomography (CT) is a painless test that uses an x-ray machine to take clear, detailed pictures of your heart. For further information please visit [email or web address]. Please follow instruction sheet as given.     Follow-Up: You are scheduled to see Dr. Angelena Form on May 11,2020 at 9:40  Please arrive at the [facility] main entrance of [facility] at xx:xx AM (30-45 minutes prior to test start time)--we will contact you to schedule this appointment  [facility] [address] [place], [place] [address] [phone]  Proceed to the [facility] Radiology Department (First Floor).  Please follow these instructions carefully (unless otherwise directed):    On the Night Before the Test: . Be sure to Drink plenty of water. . Do not consume any caffeinated/decaffeinated beverages or chocolate 12 hours prior to your test. . Do not take any antihistamines 12 hours prior to your test.   On the Day of the Test: . Drink plenty of water. Do not drink any water within one hour of the test. . Do not eat any food 4 hours prior to the test. . You may take your regular medications prior to the test. Do not take lisinopril/HCTZ the morning of the test . Take  metoprolol (Lopressor) two hours prior to test. Dose is 50 mg . HOLD Furosemide/Hydrochlorothiazide morning of the test.                      After the Test: . Drink plenty of water. . After receiving IV contrast, you may experience a mild flushed feeling. This is normal. . On occasion, you may experience a mild rash up to 24 hours after the test. This is not dangerous. If this occurs, you can take Benadryl 25 mg and increase your fluid intake. . If you experience trouble breathing, this can be serious. If it is severe call 911 IMMEDIATELY. If it is mild, please call our office. . If you take any of these medications: Glipizide/Metformin, Avandament, Glucavance, please do not take 48 hours after completing test.

## 2018-05-20 ENCOUNTER — Telehealth: Payer: Self-pay | Admitting: Cardiovascular Disease

## 2018-05-20 NOTE — Telephone Encounter (Signed)
Left message to call back  

## 2018-05-20 NOTE — Telephone Encounter (Signed)
New Message:    Patient states that she is returning a call from yesterday from the nurse. Please call patient back.

## 2018-05-27 NOTE — Telephone Encounter (Signed)
Left message to call back  

## 2018-05-31 ENCOUNTER — Ambulatory Visit (HOSPITAL_COMMUNITY)
Admission: RE | Admit: 2018-05-31 | Discharge: 2018-05-31 | Disposition: A | Discharge status: Home / Self Care | Payer: Medicare Other | Source: Ambulatory Visit | Attending: Cardiovascular Disease | Admitting: Cardiovascular Disease

## 2018-05-31 ENCOUNTER — Other Ambulatory Visit: Payer: Self-pay

## 2018-05-31 ENCOUNTER — Encounter (HOSPITAL_COMMUNITY): Payer: Self-pay

## 2018-05-31 DIAGNOSIS — I25118 Atherosclerotic heart disease of native coronary artery with other forms of angina pectoris: Secondary | ICD-10-CM

## 2018-05-31 LAB — GLUCOSE, CAPILLARY: GLUCOSE-CAPILLARY: 168 mg/dL — AB (ref 70–99)

## 2018-05-31 MED ORDER — NITROGLYCERIN 0.4 MG SL SUBL
SUBLINGUAL_TABLET | SUBLINGUAL | Status: AC
  Filled 2018-05-31: qty 2

## 2018-05-31 MED ORDER — NITROGLYCERIN 0.4 MG SL SUBL
0.8000 mg | SUBLINGUAL_TABLET | Freq: Once | SUBLINGUAL | Status: AC
  Administered 2018-05-31: 0.8 mg via SUBLINGUAL
  Filled 2018-05-31: qty 25

## 2018-05-31 MED ORDER — IOHEXOL 350 MG/ML SOLN
80.0000 mL | Freq: Once | INTRAVENOUS | Status: AC | PRN
  Administered 2018-05-31: 80 mL via INTRAVENOUS

## 2018-05-31 NOTE — Progress Notes (Signed)
CT complete. Patient denies any complaints. Patient given blanket, snack and beverage.

## 2018-06-03 ENCOUNTER — Telehealth: Payer: Self-pay | Admitting: Cardiovascular Disease

## 2018-06-03 NOTE — Telephone Encounter (Signed)
New Message:    Pt would like for Pat to call her please. She wants to talk to her about her CT results.

## 2018-06-03 NOTE — Telephone Encounter (Signed)
I left patient a message informing her that we would call with results.  3/19

## 2018-06-07 ENCOUNTER — Telehealth: Payer: Self-pay | Admitting: *Deleted

## 2018-06-07 MED ORDER — ISOSORBIDE MONONITRATE ER 30 MG PO TB24: 15.0000 mg | ORAL_TABLET | Freq: Every day | ORAL | 1 refills | Status: DC

## 2018-06-07 NOTE — Telephone Encounter (Signed)
Jonathon Jordan, RN  06/07/18 5:34 PM  Note    Imdur sent to [facility] in [place].     Jonathon Jordan, RN   06/07/18 5:28 PM  _______________________________________________________________________________________ Note    ----- Message from Burnell Blanks, MD sent at 06/07/2018 12:32 PM EDT ----- CTA with non-obstructive LAD plaque and possible more severe plaque in the small obtuse marginal branch. I have reviewed the results with her. She is on home quarantine per her primary doctor with flu like symptoms currently. Will start Imdur 15 mg daily. Consider cardiac cath if no resolution of symptoms with Imdur, after the Covid 19 precautions are lifted (unless she becomes unstable before then). Can we send this in to [facility] in [place]? Sending to triage since [facility] is out. Thanks, Gerald Stabs

## 2018-06-07 NOTE — Telephone Encounter (Signed)
Dr. Angelena Form spoke with patient today.

## 2018-06-07 NOTE — Telephone Encounter (Signed)
Imdur sent to [facility] in [place].

## 2018-06-07 NOTE — Telephone Encounter (Signed)
-----   Message from Burnell Blanks, MD sent at 06/07/2018 12:32 PM EDT ----- CTA with non-obstructive LAD plaque and possible more severe plaque in the small obtuse marginal branch. I have reviewed the results with her. She is on home quarantine per her primary doctor with flu like symptoms currently. Will start Imdur 15 mg daily. Consider cardiac cath if no resolution of symptoms with Imdur, after the Covid 19 precautions are lifted (unless she becomes unstable before then). Can we send this in to [facility] in [place]? Sending to triage since [facility] is out. Thanks, Gerald Stabs

## 2018-06-08 ENCOUNTER — Encounter (INDEPENDENT_AMBULATORY_CARE_PROVIDER_SITE_OTHER): Payer: Self-pay | Admitting: Internal Medicine

## 2018-07-12 ENCOUNTER — Ambulatory Visit (INDEPENDENT_AMBULATORY_CARE_PROVIDER_SITE_OTHER): Payer: Medicare Other | Admitting: Nurse Practitioner

## 2018-07-20 ENCOUNTER — Telehealth: Payer: Self-pay | Admitting: Cardiovascular Disease

## 2018-07-20 NOTE — Telephone Encounter (Signed)
° °  5/5 :  Left VM for patient to return call regarding visit on 07/26/2018. OV needs to be a virtual visit and also needing consent from patient. When patient returns call please transfer to me. Cell [phone].

## 2018-07-22 NOTE — Telephone Encounter (Signed)
I placed call to pt and left message to call office.  

## 2018-07-23 NOTE — Telephone Encounter (Signed)
Phone visit. May 11,2020 Consent obtained on May 8,2020.   Virtual Visit Pre-Appointment Phone Call  "(Name), I am calling you today to discuss your upcoming appointment. We are currently trying to limit exposure to the virus that causes COVID-19 by seeing patients at home rather than in the office."  "What is the BEST phone number to call the day of the visit?" -   [phone] 1. "Do you have or have access to (through a family member/friend) a smartphone with video capability that we can use for your visit?" NO a. If yes - list this number in appt notes as "cell" (if different from BEST phone #) and list the appointment type as a VIDEO visit in appointment notes b. If no - list the appointment type as a PHONE visit in appointment notes  2. Confirm consent - "In the setting of the current Covid19 crisis, you are scheduled for a phone visit with your provider on May 11,2020 at 9:30.  Just as we do with many in-office visits, in order for you to participate in this visit, we must obtain consent.  If you'd like, I can send this to your mychart (if signed up) or email for you to review.  Otherwise, I can obtain your verbal consent now.  All virtual visits are billed to your insurance company just like a normal visit would be.  By agreeing to a virtual visit, we'd like you to understand that the technology does not allow for your provider to perform an examination, and thus may limit your provider's ability to fully assess your condition. If your provider identifies any concerns that need to be evaluated in person, we will make arrangements to do so.  Finally, though the technology is pretty good, we cannot assure that it will always work on either your or our end, and in the setting of a video visit, we may have to convert it to a phone-only visit.  In either situation, we cannot ensure that we have a secure connection.  Are you willing to proceed?" STAFF: Did the patient verbally acknowledge consent  to telehealth visit? Document YES/NO here: yes  3. Advise patient to be prepared - "Two hours prior to your appointment, go ahead and check your blood pressure, pulse, oxygen saturation, and your weight (if you have the equipment to check those) and write them all down. When your visit starts, your provider will ask you for this information. If you have an Apple Watch or Kardia device, please plan to have heart rate information ready on the day of your appointment. Please have a pen and paper handy nearby the day of the visit as well."  4. Give patient instructions for MyChart download to smartphone OR [email or web address] as below if video visit (depending on what platform provider is using)  5. Inform patient they will receive a phone call 15 minutes prior to their appointment time (may be from unknown caller ID) so they should be prepared to answer    TELEPHONE CALL NOTE  Miranda Fuller has been deemed a candidate for a follow-up tele-health visit to limit community exposure during the Covid-19 pandemic. I spoke with the patient via phone to ensure availability of phone/video source, confirm preferred email & phone number, and discuss instructions and expectations.  I reminded Miranda Fuller to be prepared with any vital sign and/or heart rhythm information that could potentially be obtained via home monitoring, at the time of her visit. I reminded Miranda Fuller to  expect a phone call prior to her visit.  [name], RN 07/23/2018 10:45 AM   INSTRUCTIONS FOR DOWNLOADING THE MYCHART APP TO SMARTPHONE  - The patient must first make sure to have activated MyChart and know their login information - If Apple, go to [employer] and type in MyChart in the search bar and download the app. If Android, ask patient to go to [employer] and type in [place] in the search bar and download the app. The app is free but as with any other app downloads, their phone may require them to verify saved payment  information or Apple/Android password.  - The patient will need to then log into the app with their MyChart username and password, and select [facility] as their healthcare provider to link the account. When it is time for your visit, go to the MyChart app, find appointments, and click Begin Video Visit. Be sure to Select Allow for your device to access the Microphone and Camera for your visit. You will then be connected, and your provider will be with you shortly.  **If they have any issues connecting, or need assistance please contact MyChart service desk [phone])**  **If using a computer, in order to ensure the best quality for their visit they will need to use either of the following Internet Browsers: [employer], or Google Chrome**  IF USING DOXIMITY or DOXY.ME - The patient will receive a link just prior to their visit by text.     FULL LENGTH CONSENT FOR TELE-HEALTH VISIT   I hereby voluntarily request, consent and authorize [facility] and its employed or contracted physicians, physician assistants, nurse practitioners or other licensed health care professionals (the Practitioner), to provide me with telemedicine health care services (the "Services") as deemed necessary by the treating Practitioner. I acknowledge and consent to receive the Services by the Practitioner via telemedicine. I understand that the telemedicine visit will involve communicating with the Practitioner through live audiovisual communication technology and the disclosure of certain medical information by electronic transmission. I acknowledge that I have been given the opportunity to request an in-person assessment or other available alternative prior to the telemedicine visit and am voluntarily participating in the telemedicine visit.  I understand that I have the right to withhold or withdraw my consent to the use of telemedicine in the course of my care at any time, without affecting my right  to future care or treatment, and that the Practitioner or I may terminate the telemedicine visit at any time. I understand that I have the right to inspect all information obtained and/or recorded in the course of the telemedicine visit and may receive copies of available information for a reasonable fee.  I understand that some of the potential risks of receiving the Services via telemedicine include:  Marland Kitchen Delay or interruption in medical evaluation due to technological equipment failure or disruption; . Information transmitted may not be sufficient (e.g. poor resolution of images) to allow for appropriate medical decision making by the Practitioner; and/or  . In rare instances, security protocols could fail, causing a breach of personal health information.  Furthermore, I acknowledge that it is my responsibility to provide information about my medical history, conditions and care that is complete and accurate to the best of my ability. I acknowledge that Practitioner's advice, recommendations, and/or decision may be based on factors not within their control, such as incomplete or inaccurate data provided by me or distortions of diagnostic images or specimens that  may result from electronic transmissions. I understand that the practice of medicine is not an exact science and that Practitioner makes no warranties or guarantees regarding treatment outcomes. I acknowledge that I will receive a copy of this consent concurrently upon execution via email to the email address I last provided but may also request a printed copy by calling the office of [facility].    I understand that my insurance will be billed for this visit.   I have read or had this consent read to me. . I understand the contents of this consent, which adequately explains the benefits and risks of the Services being provided via telemedicine.  . I have been provided ample opportunity to ask questions regarding this consent and the Services  and have had my questions answered to my satisfaction. . I give my informed consent for the services to be provided through the use of telemedicine in my medical care  By participating in this telemedicine visit I agree to the above.

## 2018-07-23 NOTE — Telephone Encounter (Signed)
Patient is returning call.  °

## 2018-07-26 ENCOUNTER — Other Ambulatory Visit: Payer: Self-pay

## 2018-07-26 ENCOUNTER — Telehealth (INDEPENDENT_AMBULATORY_CARE_PROVIDER_SITE_OTHER): Payer: Medicare Other | Admitting: Cardiovascular Disease

## 2018-07-26 ENCOUNTER — Encounter: Payer: Self-pay | Admitting: Cardiovascular Disease

## 2018-07-26 VITALS — BP 157/83 | HR 74 | Ht 59.0 in | Wt 222.2 lb

## 2018-07-26 DIAGNOSIS — E7800 Pure hypercholesterolemia, unspecified: Secondary | ICD-10-CM

## 2018-07-26 DIAGNOSIS — I25118 Atherosclerotic heart disease of native coronary artery with other forms of angina pectoris: Secondary | ICD-10-CM | POA: Diagnosis not present

## 2018-07-26 MED ORDER — ISOSORBIDE MONONITRATE ER 30 MG PO TB24: 30.0000 mg | ORAL_TABLET | Freq: Every day | ORAL | 1 refills | Status: DC

## 2018-07-26 NOTE — Progress Notes (Signed)
Virtual Visit via Video Note   This visit type was conducted due to national recommendations for restrictions regarding the COVID-19 Pandemic (e.g. social distancing) in an effort to limit this patient's exposure and mitigate transmission in our community.  Due to her co-morbid illnesses, this patient is at least at moderate risk for complications without adequate follow up.  This format is felt to be most appropriate for this patient at this time.  All issues noted in this document were discussed and addressed.  A limited physical exam was performed with this format.  Please refer to the patient's chart for her consent to telehealth for [facility].   Date:  07/26/2018   ID:  Miranda Fuller, DOB [birth year], MRN [id number]  Patient Location: Home Provider Location: Office  PCP:  Doree Albee, MD  Cardiologist:  Lauree Chandler, MD  Electrophysiologist:  None   Evaluation Performed:  Follow-Up Visit  Chief Complaint:  Follow up- CAD  History of Present Illness:    Miranda Fuller is a 68 y.o. female with history of CAD, DM, HTN, sleep apnea, obesity, depression and early dementia who is being seen today by virtual e-visit due to the [place] pandemic. She was seen in 2016 by Dr. Percival Spanish. She had a cardiac cath in 2011 which showed mild luminal irregularities but no obstructive CAD.  Exercise stress test in 2016 with no ischemic EKG changes. Echo December 2019 with LVEF=60-65%, sclerotic aortic valve without stenosis.  Coronary CTA March 2020 with moderate LAD stenosis and moderate stenosis in a small caliber obtuse marginal branch. Small non-dominant RCA system.   The patient denies palpitations, dizziness, near syncope or syncope. She has chest pains with radiation to her left arm. No change in severity or frequency. She has some dyspnea. She continues to have occasional left lower ext edema that resolves at night, does not occur every day.    The patient does not have  symptoms concerning for COVID-19 infection (fever, chills, cough, or new shortness of breath).    Past Medical History:  Diagnosis Date  . Coccidioidomycosis, pulmonary ([place])   . DDD (degenerative disc disease), cervical   . Dementia ([place])    Early per Dr. Luan Pulling  . Depression 2009  . DM (diabetes mellitus screen) 2009  . Glaucoma   . HTN (hypertension)   . Morbid obesity ([place])   . Sleep apnea    Past Surgical History:  Procedure Laterality Date  . ABDOMINAL HYSTERECTOMY    . CHOLECYSTECTOMY    . TUBAL LIGATION    . UVULOPALATOPHARYNGOPLASTY       Current Meds  Medication Sig  . aspirin 81 MG tablet Take 1 tablet by mouth daily.  . Calcium Carbonate-Vit D-Min (CALCIUM 1200 PO) Take 1,200 mg by mouth 2 (two) times daily.  . insulin aspart (NOVOLOG) 100 UNIT/ML injection Inject into the skin once. 5-15 units on sliding scale  . insulin glargine (LANTUS) 100 UNIT/ML injection Inject 72 Units into the skin at bedtime.   . isosorbide mononitrate (IMDUR) 30 MG 24 hr tablet Take 1 tablet (30 mg total) by mouth daily.  Marland Kitchen lisinopril-hydrochlorothiazide (PRINZIDE,ZESTORETIC) 10-12.5 MG tablet Take 1 tablet by mouth daily.  . Magnesium (V-R MAGNESIUM) 250 MG TABS Take 400 Units by mouth daily.  . Multiple Vitamins-Minerals (MULTIVITAMIN ADULT PO) Take by mouth daily.  . NP THYROID 60 MG tablet Take 60 mg by mouth daily.   . Pediatric Multivit-Minerals-C (FLINTSTONES COMPLETE) CHEW Chew 1 tablet by  mouth daily.  Marland Kitchen venlafaxine XR (EFFEXOR XR) 75 MG 24 hr capsule Take 75 mg by mouth daily.  . vitamin B-12 (CYANOCOBALAMIN) 500 MCG tablet Take 1,000 mcg by mouth 2 (two) times daily.  . Vitamin D, Cholecalciferol, 1000 UNITS TABS Take 14,000 Units by mouth daily.   . [DISCONTINUED] isosorbide mononitrate (IMDUR) 30 MG 24 hr tablet Take 0.5 tablets (15 mg total) by mouth daily.     Allergies:   Oxycodone-acetaminophen; Propoxyphene; Demerol [meperidine]; Morphine and related; and  Oxycodone-aspirin   Social History   Tobacco Use  . Smoking status: Never Smoker  . Smokeless tobacco: Never Used  Substance Use Topics  . Alcohol use: Yes    Comment: social  . Drug use: No     Family Hx: The patient's family history includes Breast cancer in her mother; COPD in her mother; Cancer in her mother; Diabetes in her mother; Heart disease in her father and mother; Hypertension in her daughter; Peripheral vascular disease (age of onset: 60) in her father; Polycystic kidney disease in her sister.  ROS:   Please see the history of present illness.    All other systems reviewed and are negative.   Prior CV studies:   The following studies were reviewed today:   Labs/Other Tests and Data Reviewed:    EKG:  No ECG reviewed.  Recent Labs: 05/17/2018: BUN 18; Creatinine, Ser 0.72; Potassium 4.5; Sodium 138   Recent Lipid Panel Lab Results  Component Value Date/Time   CHOL  01/30/2010 03:05 AM    184        ATP III CLASSIFICATION:  <200     mg/dL   Desirable  200-239  mg/dL   Borderline High  >=240    mg/dL   High          TRIG 308 (H) 01/30/2010 03:05 AM   HDL 44 01/30/2010 03:05 AM   CHOLHDL 4.2 01/30/2010 03:05 AM   LDLCALC  01/30/2010 03:05 AM    78        Total Cholesterol/HDL:CHD Risk Coronary Heart Disease Risk Table                     Men   Women  1/2 Average Risk   3.4   3.3  Average Risk       5.0   4.4  2 X Average Risk   9.6   7.1  3 X Average Risk  23.4   11.0        Use the calculated Patient Ratio above and the CHD Risk Table to determine the patient's CHD Risk.        ATP III CLASSIFICATION (LDL):  <100     mg/dL   Optimal  100-129  mg/dL   Near or Above                    Optimal  130-159  mg/dL   Borderline  160-189  mg/dL   High  >190     mg/dL   Very High    Wt Readings from Last 3 Encounters:  07/26/18 222 lb 3.2 oz (100.8 kg)  05/17/18 220 lb 12.8 oz (100.2 kg)  11/24/14 233 lb (105.7 kg)     Objective:    Vital  Signs:  BP (!) 157/83   Pulse 74   Ht 4\' 11"  (1.499 m)   Wt 222 lb 3.2 oz (100.8 kg)   BMI 44.88 kg/m    No  exam  Phone visit  ASSESSMENT & PLAN:    1. CAD with chest pain: She has known mild CAD by cath in 2011 and no more than moderate lesions by coronary CTA March 2020. She has had chronic atypical chest pains over the past few years.  She does not tolerate statins. Continue ASA and Imdur. Will increase Imdur to 30 mg daily.   2. Hyperlipidemia: She has not tolerate Crestor in the past. Will attempt to get her recent lipid profile from her primary care office. Based on her lipids, will consider low dose Pravastatin or Praluent/Repatha.   COVID-19 Education: The signs and symptoms of COVID-19 were discussed with the patient and how to seek care for testing (follow up with PCP or arrange E-visit).  The importance of social distancing was discussed today.  Time:   Today, I have spent 17 minutes with the patient with telehealth technology discussing the above problems.     Medication Adjustments/Labs and Tests Ordered: Current medicines are reviewed at length with the patient today.  Concerns regarding medicines are outlined above.   Tests Ordered: No orders of the defined types were placed in this encounter.   Medication Changes: Meds ordered this encounter  Medications  . isosorbide mononitrate (IMDUR) 30 MG 24 hr tablet    Sig: Take 1 tablet (30 mg total) by mouth daily.    Dispense:  90 tablet    Refill:  1    Dose change    Disposition:  Follow up in 6 month(s)  Signed, Lauree Chandler, MD  07/26/2018 10:02 AM    [place]

## 2018-07-26 NOTE — Patient Instructions (Signed)
Medication Instructions:  1) INCREASE Imdur to 30mg  once daily  If you need a refill on your cardiac medications before your next appointment, please call your pharmacy.   Lab work: None If you have labs (blood work) drawn today and your tests are completely normal, you will receive your results only by: Marland Kitchen MyChart Message (if you have MyChart) OR . A paper copy in the mail If you have any lab test that is abnormal or we need to change your treatment, we will call you to review the results.  Testing/Procedures: None  Follow-Up: At [facility], you and your health needs are our priority.  As part of our continuing mission to provide you with exceptional heart care, we have created designated Provider Care Teams.  These Care Teams include your primary Cardiologist (physician) and Advanced Practice Providers (APPs -  Physician Assistants and Nurse Practitioners) who all work together to provide you with the care you need, when you need it. You will need a follow up appointment in 6 months.  Please call our office 2 months in advance to schedule this appointment.  You may see Lauree Chandler, MD or one of the following Advanced Practice Providers on your designated Care Team:   [place], PA-C Melina Copa, PA-C . Ermalinda Barrios, PA-C  Any Other Special Instructions Will Be Listed Below (If Applicable).

## 2018-07-29 ENCOUNTER — Telehealth: Payer: Self-pay | Admitting: *Deleted

## 2018-07-29 DIAGNOSIS — E7800 Pure hypercholesterolemia, unspecified: Secondary | ICD-10-CM

## 2018-07-29 DIAGNOSIS — I25118 Atherosclerotic heart disease of native coronary artery with other forms of angina pectoris: Secondary | ICD-10-CM

## 2018-07-29 NOTE — Telephone Encounter (Signed)
Left message to call office

## 2018-07-29 NOTE — Telephone Encounter (Signed)
-----   Message from Burnell Blanks, MD sent at 07/28/2018  8:25 AM EDT ----- Fraser Din, It does not look like she had lipids checked in primary care, at least not on these labs. She will need to come in for a fasting lipid profile this summer. No need to expose her to coming in over the next few weeks. Thanks, chris

## 2018-08-12 NOTE — Telephone Encounter (Signed)
Left message to call office

## 2018-08-13 NOTE — Telephone Encounter (Signed)
Follow up  ° ° °Pt is returning call  ° ° °Please call back  °

## 2018-08-13 NOTE — Telephone Encounter (Signed)
I spoke with pt and she will come to office for fasting lipid profile on August 17,2020

## 2018-10-27 ENCOUNTER — Encounter: Payer: Self-pay | Admitting: Cardiovascular Disease

## 2018-10-28 ENCOUNTER — Telehealth: Payer: Self-pay | Admitting: Cardiovascular Disease

## 2018-10-28 NOTE — Telephone Encounter (Signed)
Spoke with the pt and informed her that Dr. Dolly Rias office lab results do not fall into Epic under her labs, for they usually fax these results to our office, then we scan thereafter.  Advised the pt to call her PCP now and have them fax her labs she had done yesterday, to our office at [phone] ATTN: Dr. Angelena Form and Fraser Din RN.  Informed the pt that once medical records receives her lab results, they will then scan this into her chart and send this to Dr. Angelena Form to review and advise on.  Pt verbalized understanding and agrees with this plan. Will send this message to Dr. Angelena Form and Fraser Din RN as an Juluis Rainier.

## 2018-10-28 NOTE — Telephone Encounter (Signed)
New Message   Patient is calling because she cancelled her lab appt due to having labs done PCP. She states that the PCP is part of [facility] so the lab results will be in Epic. Dr. Anastasio Champion is the PCP

## 2018-11-01 ENCOUNTER — Other Ambulatory Visit: Payer: Medicare Other

## 2018-11-01 ENCOUNTER — Other Ambulatory Visit: Payer: Self-pay | Admitting: Internal Medicine

## 2018-11-01 DIAGNOSIS — Z1231 Encounter for screening mammogram for malignant neoplasm of breast: Secondary | ICD-10-CM

## 2018-11-01 NOTE — Telephone Encounter (Signed)
Thanks

## 2018-11-03 ENCOUNTER — Other Ambulatory Visit (HOSPITAL_COMMUNITY): Payer: Self-pay | Admitting: Internal Medicine

## 2018-11-03 DIAGNOSIS — Z1231 Encounter for screening mammogram for malignant neoplasm of breast: Secondary | ICD-10-CM

## 2018-11-08 NOTE — Telephone Encounter (Signed)
Lab work has not been scanned in chart.  I placed call to Dr. Dolly Rias office and left message on office voicemail requesting recent lab results to be faxed to our office.

## 2018-11-17 NOTE — Telephone Encounter (Signed)
Pt called [facility] for the appt for mammogram to set. Stated now she is having dark d/c from her breast with lump in the left breast.  Would you like to get her in to be seen for this concerns?

## 2018-11-17 NOTE — Telephone Encounter (Signed)
Set for oct 1 at 3040. If we have a cancelation I will call her to move to sooner appt if she can come.

## 2018-11-25 NOTE — Telephone Encounter (Signed)
Lab results not in chart yet.  I spoke with Dr. Lanice Shirts office and asked them to resend lab work from August

## 2018-11-26 NOTE — Telephone Encounter (Signed)
See scanned in lab results from 10/27/18

## 2018-11-29 NOTE — Telephone Encounter (Signed)
Her LDL is not at goal of 70. We had spoken about trying low dose Pravastatin 20 mg daily or lipid clinic referral to consider Repatha or Praluent. Can you check with her and see if she would be willing to try Pravastatin? If so, we can repeat lipids and LFTS in 12 weeks. If she would like, we can refer her to the lipid clinic now. Thanks, chris

## 2018-11-30 NOTE — Telephone Encounter (Signed)
Left message to call office

## 2018-12-16 ENCOUNTER — Ambulatory Visit (INDEPENDENT_AMBULATORY_CARE_PROVIDER_SITE_OTHER): Payer: Medicare Other | Admitting: Internal Medicine

## 2018-12-24 ENCOUNTER — Encounter: Payer: Self-pay | Admitting: *Deleted

## 2018-12-24 NOTE — Telephone Encounter (Signed)
Mailbox is full.

## 2018-12-24 NOTE — Telephone Encounter (Signed)
Letter mailed to pt asking her to contact office to review results.

## 2018-12-27 ENCOUNTER — Ambulatory Visit (HOSPITAL_COMMUNITY)
Admission: RE | Admit: 2018-12-27 | Discharge: 2018-12-27 | Disposition: A | Discharge status: Home / Self Care | Payer: Medicare Other | Source: Ambulatory Visit | Attending: Internal Medicine | Admitting: Internal Medicine

## 2018-12-27 ENCOUNTER — Encounter (INDEPENDENT_AMBULATORY_CARE_PROVIDER_SITE_OTHER): Payer: Self-pay | Admitting: Internal Medicine

## 2018-12-27 ENCOUNTER — Other Ambulatory Visit (INDEPENDENT_AMBULATORY_CARE_PROVIDER_SITE_OTHER): Payer: Self-pay

## 2018-12-27 ENCOUNTER — Ambulatory Visit (INDEPENDENT_AMBULATORY_CARE_PROVIDER_SITE_OTHER): Payer: Medicare Other | Admitting: Internal Medicine

## 2018-12-27 ENCOUNTER — Other Ambulatory Visit (HOSPITAL_COMMUNITY): Payer: Self-pay | Admitting: Internal Medicine

## 2018-12-27 ENCOUNTER — Other Ambulatory Visit: Payer: Self-pay | Admitting: Internal Medicine

## 2018-12-27 ENCOUNTER — Other Ambulatory Visit: Payer: Self-pay

## 2018-12-27 DIAGNOSIS — N6452 Nipple discharge: Secondary | ICD-10-CM

## 2018-12-27 DIAGNOSIS — N6320 Unspecified lump in the left breast, unspecified quadrant: Secondary | ICD-10-CM

## 2018-12-27 DIAGNOSIS — N644 Mastodynia: Secondary | ICD-10-CM

## 2018-12-27 DIAGNOSIS — Z13820 Encounter for screening for osteoporosis: Secondary | ICD-10-CM

## 2018-12-27 DIAGNOSIS — R911 Solitary pulmonary nodule: Secondary | ICD-10-CM

## 2018-12-27 DIAGNOSIS — N63 Unspecified lump in unspecified breast: Secondary | ICD-10-CM | POA: Diagnosis not present

## 2018-12-27 DIAGNOSIS — D0511 Intraductal carcinoma in situ of right breast: Secondary | ICD-10-CM | POA: Insufficient documentation

## 2018-12-27 HISTORY — DX: Solitary pulmonary nodule: R91.1

## 2018-12-27 HISTORY — DX: Unspecified lump in unspecified breast: N63.0

## 2018-12-27 NOTE — Progress Notes (Signed)
Wellness Office Visit  Subjective:  Patient ID: Miranda Fuller, female    DOB: [birth year]  Age: 68 y.o. MRN: [id number]  CC: This lady comes in because she is having symptoms with her breast bilaterally which have been present for several months. HPI  She describes tenderness in both breasts more on the left than the right but also a brown/red discharge from both nipples.  She has not noticed any mass.  The last mammogram she had was in 2017 and this was normal.  She is now concerned about the symptoms. , Also she is concerned about follow-up of a left upper lobe lung nodule that was seen on CT scan in 2018.  Again, she does not appear to have any worsening lung symptoms at the present time but she is concerned about this. Past Medical History:  Diagnosis Date  . Breast mass 12/27/2018  . Coccidioidomycosis, pulmonary ([place])   . DDD (degenerative disc disease), cervical   . Dementia ([place])    Early per Dr. Luan Pulling  . Depression 2009  . DM (diabetes mellitus screen) 2009  . Glaucoma   . HTN (hypertension)   . Morbid obesity ([place])   . Nodule of upper lobe of left lung 12/27/2018  . Sleep apnea       Family History  Problem Relation Age of Onset  . Heart disease Mother        "enlarged heart and leaking valves"  . COPD Mother   . Diabetes Mother   . Breast cancer Mother   . Cancer Mother   . Peripheral vascular disease Father 22  . Heart disease Father   . Polycystic kidney disease Sister   . Hypertension Daughter     Social History   Social History Narrative   Lives at home with children. Married for 48 years.Lives with husband.Retired-previously CNA,Teacher at day care.     Current Meds  Medication Sig  . aspirin 81 MG tablet Take 1 tablet by mouth daily.  . Calcium Carbonate-Vit D-Min (CALCIUM 1200 PO) Take 1,200 mg by mouth 2 (two) times daily.  . Cholecalciferol (VITAMIN D-3) 25 MCG (1000 UT) CAPS Take 3 capsules by mouth daily.  . insulin aspart (NOVOLOG)  100 UNIT/ML injection Inject 5-15 Units into the skin 3 (three) times daily with meals. 5-15 units on sliding scale   . insulin glargine (LANTUS) 100 UNIT/ML injection Inject 52 Units into the skin at bedtime.   . isosorbide mononitrate (IMDUR) 30 MG 24 hr tablet Take 1 tablet (30 mg total) by mouth daily.  Marland Kitchen lisinopril-hydrochlorothiazide (PRINZIDE,ZESTORETIC) 10-12.5 MG tablet Take 1 tablet by mouth daily.  . Multiple Vitamins-Minerals (MULTIVITAMIN ADULT PO) Take by mouth daily.  . vitamin B-12 (CYANOCOBALAMIN) 500 MCG tablet Take 1,000 mcg by mouth 2 (two) times daily.  . [DISCONTINUED] Magnesium (V-R MAGNESIUM) 250 MG TABS Take 400 Units by mouth daily.  . [DISCONTINUED] NP THYROID 60 MG tablet Take 60 mg by mouth daily.   . [DISCONTINUED] Pediatric Multivit-Minerals-C (FLINTSTONES COMPLETE) CHEW Chew 1 tablet by mouth daily.  . [DISCONTINUED] venlafaxine XR (EFFEXOR XR) 75 MG 24 hr capsule Take 75 mg by mouth daily.  . [DISCONTINUED] Vitamin D, Cholecalciferol, 1000 UNITS TABS Take 14,000 Units by mouth daily.       Objective:   Today's Vitals: BP (!) 141/82 (BP Location: Left Arm, Patient Position: Sitting, Cuff Size: Normal)   Pulse 72   Temp 97.6 F (36.4 C) (Temporal)   Resp 18   Ht 5'  4" (1.626 m)   Wt 223 lb 12.8 oz (101.5 kg)   SpO2 99% Comment: rm air w/ mask.  BMI 38.42 kg/m  Vitals with BMI 12/27/2018 07/26/2018 05/31/2018  Height 5\' 4"  4\' 11"  -  Weight 223 lbs 13 oz 222 lbs 3 oz -  BMI 99991111 0000000 -  Systolic [id number]  Diastolic 82 83 85  Pulse 72 74 -     Physical Exam    On physical exam of her breasts, both of them are tender and lumpy but on the left side there is a possibility of a 1 cm firm mass at about 3:00.  There is no nipple discharge bilaterally that I can elicit.  There are no axillary lymph nodes.   Assessment   1. Breast mass   2. Nodule of upper lobe of left lung       Tests ordered No orders of the defined types were placed in this  encounter.    Plan: 1. I will send her for diagnostic mammogram. 2. I will also send her for plain chest x-ray because of the previous history of left upper lobe lung nodule. 3. Further recommendations will depend on these results.   No orders of the defined types were placed in this encounter.   Doree Albee, MD

## 2018-12-28 NOTE — Progress Notes (Signed)
Pt was called and given results. Pt stated she was still having pains in the breast. But over all; she is glad to know her xray is not any worsen news.

## 2018-12-30 ENCOUNTER — Other Ambulatory Visit: Payer: Self-pay

## 2018-12-30 ENCOUNTER — Ambulatory Visit
Admission: RE | Admit: 2018-12-30 | Discharge: 2018-12-30 | Disposition: A | Discharge status: Home / Self Care | Payer: Medicare Other | Source: Ambulatory Visit | Attending: Internal Medicine | Admitting: Internal Medicine

## 2018-12-30 DIAGNOSIS — N6452 Nipple discharge: Secondary | ICD-10-CM

## 2018-12-30 DIAGNOSIS — N644 Mastodynia: Secondary | ICD-10-CM

## 2018-12-30 DIAGNOSIS — N6320 Unspecified lump in the left breast, unspecified quadrant: Secondary | ICD-10-CM

## 2018-12-30 NOTE — Progress Notes (Signed)
Tried to call again; voicemail is full. NO answer when I call back one more time. Will try back on Monday business day.

## 2019-01-03 ENCOUNTER — Encounter (INDEPENDENT_AMBULATORY_CARE_PROVIDER_SITE_OTHER): Payer: Self-pay | Admitting: Internal Medicine

## 2019-01-03 NOTE — Progress Notes (Signed)
Called again today. Answering machine is on; But not able to leave a message or instructions. Would you like to mail her results and instruction via mail?

## 2019-01-05 ENCOUNTER — Telehealth (INDEPENDENT_AMBULATORY_CARE_PROVIDER_SITE_OTHER): Payer: Self-pay

## 2019-01-05 ENCOUNTER — Other Ambulatory Visit (INDEPENDENT_AMBULATORY_CARE_PROVIDER_SITE_OTHER): Payer: Self-pay | Admitting: Internal Medicine

## 2019-01-05 ENCOUNTER — Other Ambulatory Visit (INDEPENDENT_AMBULATORY_CARE_PROVIDER_SITE_OTHER): Payer: Self-pay

## 2019-01-05 DIAGNOSIS — N63 Unspecified lump in unspecified breast: Secondary | ICD-10-CM

## 2019-01-05 NOTE — Telephone Encounter (Signed)
Order is in system. Pt wants to go to  Address [address] for imaging.

## 2019-01-11 ENCOUNTER — Other Ambulatory Visit (HOSPITAL_COMMUNITY): Payer: Medicare Other

## 2019-01-28 ENCOUNTER — Other Ambulatory Visit: Payer: Self-pay

## 2019-01-28 ENCOUNTER — Ambulatory Visit
Admission: RE | Admit: 2019-01-28 | Discharge: 2019-01-28 | Disposition: A | Discharge status: Home / Self Care | Payer: Medicare Other | Source: Ambulatory Visit | Attending: Internal Medicine | Admitting: Internal Medicine

## 2019-01-28 DIAGNOSIS — N63 Unspecified lump in unspecified breast: Secondary | ICD-10-CM

## 2019-01-28 MED ORDER — GADOBUTROL 1 MMOL/ML IV SOLN
10.0000 mL | Freq: Once | INTRAVENOUS | Status: AC | PRN
  Administered 2019-01-28: 12:00:00 10 mL via INTRAVENOUS

## 2019-01-31 ENCOUNTER — Telehealth (INDEPENDENT_AMBULATORY_CARE_PROVIDER_SITE_OTHER): Payer: Self-pay

## 2019-01-31 ENCOUNTER — Encounter (INDEPENDENT_AMBULATORY_CARE_PROVIDER_SITE_OTHER): Payer: Self-pay | Admitting: Internal Medicine

## 2019-01-31 ENCOUNTER — Ambulatory Visit (INDEPENDENT_AMBULATORY_CARE_PROVIDER_SITE_OTHER): Payer: Medicare Other | Admitting: Internal Medicine

## 2019-01-31 ENCOUNTER — Other Ambulatory Visit: Payer: Self-pay

## 2019-01-31 ENCOUNTER — Other Ambulatory Visit: Payer: Self-pay | Admitting: Internal Medicine

## 2019-01-31 DIAGNOSIS — R1013 Epigastric pain: Secondary | ICD-10-CM

## 2019-01-31 DIAGNOSIS — E1165 Type 2 diabetes mellitus with hyperglycemia: Secondary | ICD-10-CM | POA: Diagnosis not present

## 2019-01-31 DIAGNOSIS — M542 Cervicalgia: Secondary | ICD-10-CM | POA: Diagnosis not present

## 2019-01-31 DIAGNOSIS — Z1159 Encounter for screening for other viral diseases: Secondary | ICD-10-CM | POA: Diagnosis not present

## 2019-01-31 DIAGNOSIS — R921 Mammographic calcification found on diagnostic imaging of breast: Secondary | ICD-10-CM

## 2019-01-31 DIAGNOSIS — IMO0002 Reserved for concepts with insufficient information to code with codable children: Secondary | ICD-10-CM

## 2019-01-31 HISTORY — DX: Type 2 diabetes mellitus with hyperglycemia: E11.65

## 2019-01-31 HISTORY — DX: Reserved for concepts with insufficient information to code with codable children: IMO0002

## 2019-01-31 MED ORDER — PANTOPRAZOLE SODIUM 40 MG PO TBEC: 40.0000 mg | DELAYED_RELEASE_TABLET | Freq: Every day | ORAL | 3 refills | Status: DC

## 2019-01-31 NOTE — Patient Instructions (Signed)
[facility] Dietary Recommendations for Weight Loss What to Avoid . Avoid added sugars o Often added sugar can be found in processed foods such as many condiments, dry cereals, cakes, cookies, chips, crisps, crackers, candies, sweetened drinks, etc.  o Read labels and AVOID/DECREASE use of foods with the following in their ingredient list: Sugar, fructose, high fructose corn syrup, sucrose, glucose, maltose, dextrose, molasses, cane sugar, brown sugar, any type of syrup, agave nectar, etc.   . Avoid snacking in between meals . Avoid foods made with flour o If you are going to eat food made with flour, choose those made with whole-grains; and, minimize your consumption as much as is tolerable . Avoid processed foods o These foods are generally stocked in the middle of the grocery store. Focus on shopping on the perimeter of the grocery.  What to Include . Vegetables o GREEN LEAFY VEGETABLES: Kale, spinach, mustard greens, collard greens, cabbage, broccoli, etc. o OTHER: Asparagus, cauliflower, eggplant, carrots, peas, Brussel sprouts, tomatoes, bell peppers, zucchini, beets, cucumbers, etc. . Grains, seeds, and legumes o Beans: kidney beans, black eyed peas, garbanzo beans, black beans, pinto beans, etc. o Whole, unrefined grains: brown rice, barley, bulgur, oatmeal, etc. . Healthy fats  o Avoid highly processed fats such as vegetable oil o Examples of healthy fats: avocado, olives, virgin olive oil, dark chocolate (?72% Cocoa), nuts (peanuts, almonds, walnuts, cashews, pecans, etc.) . Low - Moderate Intake of Animal Sources of Protein o Meat sources: chicken, turkey, salmon, tuna. Limit to 4 ounces of meat at one time. o Consider limiting dairy sources, but when choosing dairy focus on: PLAIN Greek yogurt, cottage cheese, high-protein milk . Fruit o Choose berries  When to Eat . Intermittent Fasting: o Choosing not to eat for a specific time period, but DO FOCUS ON HYDRATION  when fasting o Multiple Techniques: - Time Restricted Eating: eat 3 meals in a day, each meal lasting no more than 60 minutes, no snacks between meals - 16-18 hour fast: fast for 16 to 18 hours up to 7 days a week. Often suggested to start with 2-3 nonconsecutive days per week.  . Remember the time you sleep is counted as fasting.  . Examples of eating schedule: Fast from 7:00pm-11:00am. Eat between 11:00am-7:00pm.  - 24-hour fast: fast for 24 hours up to every other day. Often suggested to start with 1 day per week . Remember the time you sleep is counted as fasting . Examples of eating schedule:  o Eating day: eat 2-3 meals on your eating day. If doing 2 meals, each meal should last no more than 90 minutes. If doing 3 meals, each meal should last no more than 60 minutes. Finish last meal by 7:00pm. o Fasting day: Fast until 7:00pm.  o IF YOU FEEL UNWELL FOR ANY REASON/IN ANY WAY WHEN FASTING, STOP FASTING BY EATING A NUTRITIOUS SNACK OR LIGHT MEAL o ALWAYS FOCUS ON HYDRATION DURING FASTS - Acceptable Hydration sources: water, broths, tea/coffee (black tea/coffee is best but using a small amount of whole-fat dairy products in coffee/tea is acceptable).  - Poor Hydration Sources: anything with sugar or artificial sweeteners added to it  These recommendations have been developed for patients that are actively receiving medical care from either Dr. Nashid Pellum or Sarah Gray, DNP, NP-C at [facility]. These recommendations are developed for patients with specific medical conditions and are not meant to be distributed or used by others that are not actively receiving care from either provider listed   above at [facility]. It is not appropriate to participate in the above eating plans without proper medical supervision.   Reference: Fung, J. The obesity code. [place]: [place]; 2016.   

## 2019-01-31 NOTE — Progress Notes (Signed)
Metrics: Intervention Frequency ACO  Documented Smoking Status Yearly  Screened one or more times in 24 months  Cessation Counseling or  Active cessation medication Past 24 months  Past 24 months   Guideline developer: UpToDate (See UpToDate for funding source) Date Released: 2014       Wellness Office Visit  Subjective:  Patient ID: Miranda Fuller, female    DOB: 1950/06/12  Age: 68 y.o. MRN: [id number]  CC: This lady comes in for follow-up of hypertension, morbid obesity, uncontrolled diabetes from the past. HPI  I had seen her also about a month ago when she had bilateral breast tenderness and discharge from the nipples.  This led to a mammogram and eventually an MRI of both breasts 3 days ago which does not show any evidence of malignancy but still there are some calcifications on the right side and the recommendation is for biopsy of this and possible surgical consultation.  The patient will follow up with this and let me know which surgeon she would like to go and see based on the radiology recommendation. As far as her diabetes is concerned, she continues with insulin but she is eating healthier at this point with avoidance of bad carbohydrates.  As a result, she has lost weight. She is compliant with antihypertensive therapy.  She denies any chest pain, dyspnea, palpitations or limb weakness. She also describes neck pain and discomfort which happened all of a sudden 1 day when she went to turn her neck and she heard a pop.  Apparently in the past she has seen an orthopedic surgeon who has told her she has had some neck problems at C4/C5.  She wonders if she can get another opinion from a neurosurgeon.. She is also complaining of epigastric discomfort which radiates to the right upper quadrant which she has had for the last 1 month and this usually occurs after she eats or drinks something.  She denies any true reflux symptoms.  There is no nausea or vomiting. Past Medical History:   Diagnosis Date  . Breast mass 12/27/2018  . Coccidioidomycosis, pulmonary ([place])   . DDD (degenerative disc disease), cervical   . Dementia ([place])    Early per Dr. Luan Pulling  . Depression 2009  . DM (diabetes mellitus screen) 2009  . Glaucoma   . HTN (hypertension)   . Morbid obesity ([place])   . Nodule of upper lobe of left lung 12/27/2018  . Sleep apnea   . Type II diabetes mellitus, uncontrolled ([place]) 01/31/2019      Family History  Problem Relation Age of Onset  . Heart disease Mother        "enlarged heart and leaking valves"  . COPD Mother   . Diabetes Mother   . Breast cancer Mother   . Cancer Mother   . Peripheral vascular disease Father 23  . Heart disease Father   . Polycystic kidney disease Sister   . Hypertension Daughter   . Breast cancer Maternal Grandmother     Social History   Social History Narrative   Lives at home with children. Married for 48 years.Lives with husband.Retired.   Social History   Tobacco Use  . Smoking status: Never Smoker  . Smokeless tobacco: Never Used  Substance Use Topics  . Alcohol use: Yes    Comment: social    Current Meds  Medication Sig  . aspirin 81 MG tablet Take 1 tablet by mouth daily.  . Calcium Carbonate-Vit D-Min (CALCIUM 1200 PO) Take  1,200 mg by mouth 2 (two) times daily.  . Cholecalciferol (VITAMIN D-3) 25 MCG (1000 UT) CAPS Take 3 capsules by mouth daily.  . insulin aspart (NOVOLOG) 100 UNIT/ML injection Inject 5-15 Units into the skin 3 (three) times daily with meals. 5-15 units on sliding scale   . insulin glargine (LANTUS) 100 UNIT/ML injection Inject 52 Units into the skin at bedtime.   . isosorbide mononitrate (IMDUR) 30 MG 24 hr tablet Take 1 tablet (30 mg total) by mouth daily.  Marland Kitchen lisinopril-hydrochlorothiazide (PRINZIDE,ZESTORETIC) 10-12.5 MG tablet Take 1 tablet by mouth daily.  . Multiple Vitamins-Minerals (MULTIVITAMIN ADULT PO) Take by mouth daily.  . Pediatric Multivitamins-Iron Vicki Mallet  W/IRON PO) Take 2 tablets by mouth daily.  . vitamin B-12 (CYANOCOBALAMIN) 500 MCG tablet Take 1,000 mcg by mouth 2 (two) times daily.      Objective:   Today's Vitals: BP (!) 160/82   Pulse 72   Ht 5' 4"  (1.626 m)   Wt 218 lb 12.8 oz (99.2 kg)   BMI 37.56 kg/m  Vitals with BMI 01/31/2019 12/27/2018 07/26/2018  Height 5' 4"  5' 4"  4' 11"   Weight 218 lbs 13 oz 223 lbs 13 oz 222 lbs 3 oz  BMI 37.54 37.3 42.87  Systolic 681 157 262  Diastolic 82 82 83  Pulse 72 72 74     Physical Exam     Her blood pressure today was somewhat elevated but she will continue to monitor this at home.  She has managed to lose 5 pounds since the last visit which was only a month ago which is good news.  Neck movements show that she is limited in left lateral movement of the neck but this does not induce pain.  There certainly does not appear to be any loss in power in both her arms in all muscle groups.  Assessment   1. Morbid obesity ([place])   2. Uncontrolled type 2 diabetes mellitus with hyperglycemia ([place])   3. Encounter for hepatitis C screening test for low risk patient   4. Neck pain   5. Epigastric pain       Tests ordered Orders Placed This Encounter  Procedures  . CMP with eGFR([facility])  . Hemoglobin A1c  . Hep C Antibody  . Ambulatory referral to Neurosurgery     Plan: 1. Blood work is ordered as above. 2. She will continue with insulin for diabetes and we will see what the A1c shows. 3. I will send her to neurosurgery for further evaluation of the neck pain. 4. I will treat her empirically for the epigastric discomfort with PPI to see if this will help her, if it does not, I will refer her to gastroenterology. 5. Further recommendations will depend on blood results and I will see her in about 3 months for an annual physical exam.   Meds ordered this encounter  Medications  . pantoprazole (PROTONIX) 40 MG tablet    Sig: Take 1 tablet (40 mg total) by mouth daily.     Dispense:  30 tablet    Refill:  3    Nimish Luther Parody, MD

## 2019-02-01 ENCOUNTER — Telehealth (INDEPENDENT_AMBULATORY_CARE_PROVIDER_SITE_OTHER): Payer: Self-pay

## 2019-02-01 LAB — COMPLETE METABOLIC PANEL WITH GFR
AG Ratio: 1.5 (calc) (ref 1.0–2.5)
ALT: 12 U/L (ref 6–29)
AST: 13 U/L (ref 10–35)
Albumin: 4 g/dL (ref 3.6–5.1)
Alkaline phosphatase (APISO): 61 U/L (ref 37–153)
BUN: 12 mg/dL (ref 7–25)
CO2: 24 mmol/L (ref 20–32)
Calcium: 9.2 mg/dL (ref 8.6–10.4)
Chloride: 102 mmol/L (ref 98–110)
Creat: 0.7 mg/dL (ref 0.50–0.99)
GFR, Est African American: 105 mL/min/{1.73_m2} (ref >=60)
GFR, Est Non African American: 90 mL/min/{1.73_m2} (ref >=60)
Globulin: 2.6 g/dL (calc) (ref 1.9–3.7)
Glucose, Bld: 196 mg/dL — ABNORMAL HIGH (ref 65–99)
Potassium: 4 mmol/L (ref 3.5–5.3)
Sodium: 137 mmol/L (ref 135–146)
Total Bilirubin: 0.7 mg/dL (ref 0.2–1.2)
Total Protein: 6.6 g/dL (ref 6.1–8.1)

## 2019-02-01 LAB — HEPATITIS C ANTIBODY
Hepatitis C Ab: NONREACTIVE
SIGNAL TO CUT-OFF: 0.01 (ref <1.00)

## 2019-02-01 NOTE — Telephone Encounter (Signed)
Order  Signed and  Faxed back to [employer] [facility].

## 2019-02-01 NOTE — Telephone Encounter (Signed)
Lm at home - I will call her back at 3:15.  Need additional information for referral.

## 2019-02-06 ENCOUNTER — Encounter (INDEPENDENT_AMBULATORY_CARE_PROVIDER_SITE_OTHER): Payer: Self-pay | Admitting: Internal Medicine

## 2019-02-07 ENCOUNTER — Ambulatory Visit
Admission: RE | Admit: 2019-02-07 | Discharge: 2019-02-07 | Disposition: A | Discharge status: Home / Self Care | Payer: Medicare Other | Source: Ambulatory Visit | Attending: Internal Medicine | Admitting: Internal Medicine

## 2019-02-07 ENCOUNTER — Other Ambulatory Visit: Payer: Self-pay

## 2019-02-07 DIAGNOSIS — R921 Mammographic calcification found on diagnostic imaging of breast: Secondary | ICD-10-CM

## 2019-02-07 HISTORY — PX: BREAST BIOPSY: SHX20

## 2019-02-17 ENCOUNTER — Other Ambulatory Visit: Payer: Self-pay

## 2019-02-17 ENCOUNTER — Ambulatory Visit
Admission: RE | Admit: 2019-02-17 | Discharge: 2019-02-17 | Disposition: A | Discharge status: Home / Self Care | Payer: Medicare Other | Source: Ambulatory Visit | Attending: Radiation Oncology | Admitting: Radiation Oncology

## 2019-02-17 ENCOUNTER — Telehealth: Payer: Self-pay | Admitting: Hematology and Oncology

## 2019-02-17 ENCOUNTER — Encounter: Payer: Self-pay | Admitting: Radiation Oncology

## 2019-02-17 DIAGNOSIS — B382 Pulmonary coccidioidomycosis, unspecified: Secondary | ICD-10-CM

## 2019-02-17 DIAGNOSIS — Z809 Family history of malignant neoplasm, unspecified: Secondary | ICD-10-CM | POA: Insufficient documentation

## 2019-02-17 DIAGNOSIS — D0511 Intraductal carcinoma in situ of right breast: Secondary | ICD-10-CM

## 2019-02-17 DIAGNOSIS — N6452 Nipple discharge: Secondary | ICD-10-CM

## 2019-02-17 NOTE — Progress Notes (Signed)
Location of Breast Cancer: Right Breast  Histology per Pathology Report: esults:Diagnosis Breast, right, needle core biopsy, subareolar - DUCTAL CARCINOMA IN SITU IMMUNOHISTOCHEMICAL AND MORPHOMETRIC ANALYSIS PERFORMED MANUALLY Estrogen Receptor: 100%, POSITIVE, MODERATE STAINING INTENSITY Progesterone Receptor: 70%, POSITIVE, MODERATE STAINING INTENSITY REFERENCE RANGE ESTROGEN RECEPTOR NEGATIVE 0% POSITIVE =>1% REFERENCE RANGE PROGESTERONE RECEPTOR NEGATIVE 0% POSITIVE =>1% All controls stained appropriately  Receptor Status: ER(100%), PR (70%), Her2-neu (), Ki-()  Did patient present with symptoms  describes tenderness in both breasts more on the left than the right but also a brown/red discharge from both nipples. or was this found on screening mammography?: Diagnostic  Past/Anticipated interventions by surgeon, if any: appt 02/15/2019 to have genetic testing  Past/Anticipated interventions by medical oncology, if any: Chemotherapy not yet  Lymphedema issues, if any: none  Pain issues, if any:  none  SAFETY ISSUES:  Prior radiation? no  Pacemaker/ICD? no  Possible current pregnancy?no  Is the patient on methotrexate? no,   Current Complaints / other details:  Patient without complaints of at this time. States she saw the Surgeon on the 1st and they want to do genetic testing prior to her surgery. She denies any issues at this time.    Ross Marcus, RN 02/17/2019,10:02 AM

## 2019-02-17 NOTE — Patient Instructions (Signed)
Coronavirus (COVID-19) Are you at risk?  Are you at risk for the Coronavirus (COVID-19)?  To be considered HIGH RISK for Coronavirus (COVID-19), you have to meet the following criteria:  . Traveled to [place], [place], [place], [place] or [place]; or in the [place] to [place], [place], [place], or [place]; and have fever, cough, and shortness of breath within the last 2 weeks of travel OR . Been in close contact with a person diagnosed with COVID-19 within the last 2 weeks and have fever, cough, and shortness of breath . IF YOU DO NOT MEET THESE CRITERIA, YOU ARE CONSIDERED LOW RISK FOR COVID-19.  What to do if you are HIGH RISK for COVID-19?  . If you are having a medical emergency, call 911. . Seek medical care right away. Before you go to a doctor's office, urgent care or emergency department, call ahead and tell them about your recent travel, contact with someone diagnosed with COVID-19, and your symptoms. You should receive instructions from your physician's office regarding next steps of care.  . When you arrive at healthcare provider, tell the healthcare staff immediately you have returned from visiting [place], [place], [place], [place] or [place]; or traveled in the [place] to [place], [place], [place], or [place]; in the last two weeks or you have been in close contact with a person diagnosed with COVID-19 in the last 2 weeks.   . Tell the health care staff about your symptoms: fever, cough and shortness of breath. . After you have been seen by a medical provider, you will be either: o Tested for (COVID-19) and discharged home on quarantine except to seek medical care if symptoms worsen, and asked to  - Stay home and avoid contact with others until you get your results (4-5 days)  - Avoid travel on public transportation if possible (such as bus, train, or airplane) or o Sent to the Emergency Department by EMS for evaluation, COVID-19 testing, and possible  admission depending on your condition and test results.  What to do if you are LOW RISK for COVID-19?  Reduce your risk of any infection by using the same precautions used for avoiding the common cold or flu:  . Wash your hands often with soap and warm water for at least 20 seconds.  If soap and water are not readily available, use an alcohol-based hand sanitizer with at least 60% alcohol.  . If coughing or sneezing, cover your mouth and nose by coughing or sneezing into the elbow areas of your shirt or coat, into a tissue or into your sleeve (not your hands). . Avoid shaking hands with others and consider head nods or verbal greetings only. . Avoid touching your eyes, nose, or mouth with unwashed hands.  . Avoid close contact with people who are sick. . Avoid places or events with large numbers of people in one location, like concerts or sporting events. . Carefully consider travel plans you have or are making. . If you are planning any travel outside or inside the [place], visit the CDC's Travelers' Health webpage for the latest health notices. . If you have some symptoms but not all symptoms, continue to monitor at home and seek medical attention if your symptoms worsen. . If you are having a medical emergency, call 911.   ADDITIONAL HEALTHCARE OPTIONS FOR PATIENTS  [facility] / e-Visit: [email or web address]         [facility]: [phone]  [facility]: [phone]                   [facility]: [phone]   

## 2019-02-17 NOTE — Telephone Encounter (Signed)
Mrs. Detoro has been cld and scheduled for genetics on 12/7 at 1pm and to see Dr. Lindi Adie on 12/8 at 1pm. She's aware to arrive 15 minutes early for both appointments.

## 2019-02-17 NOTE — Addendum Note (Signed)
Encounter addended by: Heywood Footman, RN on: 02/17/2019 3:02 PM  Actions taken: Charge Capture section accepted

## 2019-02-17 NOTE — Progress Notes (Addendum)
Radiation Oncology         [phone] ________________________________  Initial Outpatient Consultation - Conducted via telephone due to current COVID-19 concerns for limiting patient exposure  I spoke with the patient to conduct this consult visit via telephone to spare the patient unnecessary potential exposure in the healthcare setting during the current COVID-19 pandemic. The patient was notified in advance and was offered a [facility] meeting to allow for face to face communication but unfortunately reported that they did not have the appropriate resources/technology to support such a visit and instead preferred to proceed with a telephone consult.  ________________________________  Name: Miranda Fuller        MRN: [id number]  Date of Service: 02/17/2019 DOB: 04/09/1950  [id number], Doristine Johns, MD  Jovita Kussmaul, MD     REFERRING PHYSICIAN: Autumn Fuller III, MD   DIAGNOSIS: The primary encounter diagnosis was Ductal carcinoma in situ (DCIS) of right breast. Diagnoses of Coccidioidomycosis, pulmonary ([place]), Family history of cancer, and Breast discharge were also pertinent to this visit.   HISTORY OF PRESENT ILLNESS: Miranda Fuller is a 68 y.o. female with a new diagnosis of left breast cancer. The patient was noted to have bilateral nipple discharge and old blood. She has had these symptoms for at least 3 years and has even had cytologic evaluation of this fluid that per report was negative for any abnormalities. More recently the patient noted progressive discharge now with darker features that looked like old blood. She presented for diagnostic imaging. Mammography revealed calcifications in the right breast that were about 3.8 mm, and she did not have any ultrasound correlate. An MRI of bilateral breasts also was performed and did not clearly show a mass. Her axillae were negative for adenopathy. She was encouraged to proceed with stereotactic biopsy and this was performed on 02/07/2019 and  revealed an intermediate grade DCIS. The cancer was ER/PR positive. She has met with Dr. [name] and is contacted by phone today to discuss treatment options for her cancer. Of note in looking through her imaging, she had a recent CXR in October 2020 that also showed a 7 mm LUL nodule. In further discussion, she reports she previously lived in [place] and had been diagnosed with coccidioidomycosis.     PREVIOUS RADIATION THERAPY: No   PAST MEDICAL HISTORY:  Past Medical History:  Diagnosis Date   Breast mass 12/27/2018   Coccidioidomycosis, pulmonary ([facility])    DDD (degenerative disc disease), cervical    Dementia ([place])    Early per Dr. Luan Pulling   Depression 2009   DM (diabetes mellitus screen) 2009   Glaucoma    HTN (hypertension)    Morbid obesity ([place])    Nodule of upper lobe of left lung 12/27/2018   Sleep apnea    Type II diabetes mellitus, uncontrolled ([place]) 01/31/2019       PAST SURGICAL HISTORY: Past Surgical History:  Procedure Laterality Date   ABDOMINAL HYSTERECTOMY     TAH BSO   BREAST BIOPSY Right    CHOLECYSTECTOMY     TUBAL LIGATION     UVULOPALATOPHARYNGOPLASTY       FAMILY HISTORY:  Family History  Problem Relation Age of Onset   Heart disease Mother        "enlarged heart and leaking valves"   COPD Mother    Diabetes Mother    Breast cancer Mother    Cancer Mother        cancer involving the colon, esophagus, and  liver   Peripheral vascular disease Father 25   Heart disease Father    Polycystic kidney disease Sister    Hypertension Daughter    Breast cancer Maternal Grandmother      SOCIAL HISTORY:  reports that she has never smoked. She has never used smokeless tobacco. She reports current alcohol use. She reports that she does not use drugs. The patient is married and lives in [place]. She prefers her care in [place]. She has a daughter who is a Marine scientist. The patient is originally from [place].    ALLERGIES:  Oxycodone-acetaminophen, Propoxyphene, Demerol [meperidine], Morphine and related, and Oxycodone-aspirin   MEDICATIONS:  Current Outpatient Medications  Medication Sig Dispense Refill   aspirin 81 MG tablet Take 1 tablet by mouth daily.     Calcium Carbonate-Vit D-Min (CALCIUM 1200 PO) Take 1,200 mg by mouth 2 (two) times daily.     Cholecalciferol (VITAMIN D-3) 25 MCG (1000 UT) CAPS Take 3 capsules by mouth daily.     insulin aspart (NOVOLOG) 100 UNIT/ML injection Inject 5-15 Units into the skin 3 (three) times daily with meals. 5-15 units on sliding scale      insulin glargine (LANTUS) 100 UNIT/ML injection Inject 52 Units into the skin at bedtime.      isosorbide mononitrate (IMDUR) 30 MG 24 hr tablet Take 1 tablet (30 mg total) by mouth daily. 90 tablet 1   lisinopril-hydrochlorothiazide (PRINZIDE,ZESTORETIC) 10-12.5 MG tablet Take 1 tablet by mouth daily.     Multiple Vitamins-Minerals (MULTIVITAMIN ADULT PO) Take by mouth daily.     pantoprazole (PROTONIX) 40 MG tablet Take 1 tablet (40 mg total) by mouth daily. 30 tablet 3   Pediatric Multivitamins-Iron (FLINTSTONES W/IRON PO) Take 2 tablets by mouth daily.     vitamin B-12 (CYANOCOBALAMIN) 500 MCG tablet Take 1,000 mcg by mouth 2 (two) times daily.     No current facility-administered medications for this encounter.      REVIEW OF SYSTEMS: On review of systems, the patient reports that she is doing  Pretty well overall. She reports that she has had progressive discharge and what appears to be old blood from the nipples. She reports tenderness at the nipple when this is expressed. She also reports that she is unable to do much exertional activity as a result of being diagnosed with  Coccidioidomycosis several years ago and that when she tries, she has coughing spells and some change sin her breathing as well as hemoptysis. She describes a history as well of dysfunctional uterine bleeding about 25 years ago and with further  work up was found to have uterine polyps that were dysplastic, and for this underwent TAHBSO. No other complaints are verbalized.    PHYSICAL EXAM:  Unable to assess due to encounter type.  ECOG = 1  0 - Asymptomatic (Fully active, able to carry on all predisease activities without restriction)  1 - Symptomatic but completely ambulatory (Restricted in physically strenuous activity but ambulatory and able to carry out work of a light or sedentary nature. For example, light housework, office work)  2 - Symptomatic, <50% in bed during the day (Ambulatory and capable of all self care but unable to carry out any work activities. Up and about more than 50% of waking hours)  3 - Symptomatic, >50% in bed, but not bedbound (Capable of only limited self-care, confined to bed or chair 50% or more of waking hours)  4 - Bedbound (Completely disabled. Cannot carry on any self-care. Totally confined  to bed or chair)  5 - Death   Eustace Pen MM, Creech RH, Tormey DC, et al. [phone]). "Toxicity and response criteria of the [facility]". [place] Oncol. 5 (6): 649-55    LABORATORY DATA:  Lab Results  Component Value Date   WBC 8.1 02/05/2010   HGB 14.3 02/05/2010   HCT 40.9 02/05/2010   MCV 92.9 02/05/2010   PLT 230 02/05/2010   Lab Results  Component Value Date   NA 137 01/31/2019   K 4.0 01/31/2019   CL 102 01/31/2019   CO2 24 01/31/2019   Lab Results  Component Value Date   ALT 12 01/31/2019   AST 13 01/31/2019   ALKPHOS 62 02/01/2010   BILITOT 0.7 01/31/2019      RADIOGRAPHY: Mr Breast Bilateral W Wo Contrast Inc Cad  Result Date: 01/28/2019 CLINICAL DATA:  Recent diagnostic evaluation for single duct dark red non spontaneous bilateral nipple discharge. Diagnostic mammogram reveals indeterminate new retroareolar calcifications in the RIGHT breast. LABS:  Creatinine was obtained on site at [facility] at [address] Results: Creatinine 0.6 mg/dL.  GFR 100. EXAM: BILATERAL BREAST MRI WITH AND WITHOUT CONTRAST TECHNIQUE: Multiplanar, multisequence MR images of both breasts were obtained prior to and following the intravenous administration of 10 ml of Gadavist Three-dimensional MR images were rendered by post-processing of the original MR data on an independent workstation. The three-dimensional MR images were interpreted, and findings are reported in the following complete MRI report for this study. Three dimensional images were evaluated at the independent DynaCad workstation. COMPARISON:  Mammogram and ultrasound on 12/30/2018 FINDINGS: Breast composition: b. Scattered fibroglandular tissue. Background parenchymal enhancement: Minimal Right breast: No mass or abnormal enhancement. Left breast: No mass or abnormal enhancement. Lymph nodes: No abnormal appearing lymph nodes. Ancillary findings:  None. Additional: There is motion artifact, degrading the pre processed images. DynaCAD processed images are used for interpretation. IMPRESSION: No MRI evidence for malignancy in either breast. No MRI correlate for the area of indeterminate calcifications in the retroareolar region of the RIGHT breast. RECOMMENDATION: Recommend stereotactic guided core biopsy of RIGHT breast calcifications. Recommend surgical consultation regarding bilateral nipple discharge. BI-RADS CATEGORY  1: Negative. Electronically Signed   By: Nolon Nations M.D.   On: 01/28/2019 13:57   Mm Clip Placement Right  Result Date: 02/07/2019 CLINICAL DATA:  Status post stereotactic biopsy of right breast calcifications. EXAM: DIAGNOSTIC RIGHT MAMMOGRAM POST STEREOTACTIC BIOPSY COMPARISON:  Previous exam(s). FINDINGS: Mammographic images were obtained following stereotactic guided biopsy of calcifications in the subareolar region of the right breast. The biopsy marking clip is in the expected location of the subareolar region of the right breast. IMPRESSION: Appropriate positioning of the X  shaped shaped biopsy marking clip at the site of biopsy in the subareolar region of the right breast. Final Assessment: Post Procedure Mammograms for Marker Placement Electronically Signed   By: Lillia Mountain M.D.   On: 02/07/2019 11:25   Mm Rt Breast Bx W Loc Dev 1st Lesion Image Bx Spec Stereo Guide  Addendum Date: 02/09/2019   ADDENDUM REPORT: 02/08/2019 15:12 ADDENDUM: Pathology revealed INTERMEDIATE GRADE DUCTAL CARCINOMA IN SITU of the RIGHT breast, subareolar. This was found to be concordant by Dr. Lillia Mountain. Pathology results were discussed by telephone with the patient and Miranda Ruths NP of [facility] in [place]. The patient reported doing well after the biopsy with tenderness at the site. Post biopsy instructions and care were reviewed and questions were  answered. The patient was encouraged to call [facility] for any additional concerns. Per patient request, surgical consultation has been arranged with Dr. Autumn Fuller at [facility] on February 15, 2019 for biopsy proven RIGHT breast cancer and BILATERAL nipple discharge. Pathology results reported by Stacie Acres, RN on 02/08/2019. Electronically Signed   By: Lillia Mountain M.D.   On: 02/08/2019 15:12   Result Date: 02/09/2019 CLINICAL DATA:  Right breast calcifications. EXAM: RIGHT BREAST STEREOTACTIC CORE NEEDLE BIOPSY COMPARISON:  Previous exams. FINDINGS: The patient and I discussed the procedure of stereotactic-guided biopsy including benefits and alternatives. We discussed the high likelihood of a successful procedure. We discussed the risks of the procedure including infection, bleeding, tissue injury, clip migration, and inadequate sampling. Informed written consent was given. The usual time out protocol was performed immediately prior to the procedure. Using sterile technique and 1% lidocaine and 1% lidocaine with epinephrine as local anesthetic, under stereotactic guidance, a 9 gauge vacuum  assisted device was used to perform core needle biopsy of calcifications in the subareolar region of the right breast using a superior to inferior approach. Specimen radiograph was performed showing calcifications are present in the tissue samples. Specimens with calcifications are identified for pathology. Lesion quadrant: Subareolar At the conclusion of the procedure, X shaped tissue marker clip was deployed into the biopsy cavity. Follow-up 2-view mammogram was performed and dictated separately. IMPRESSION: Stereotactic-guided biopsy of the right breast. No apparent complications. Electronically Signed: By: Lillia Mountain M.D. On: 02/07/2019 11:05       IMPRESSION/PLAN: 1. ER/PR positive intermediate grade DCIS of the right breast. Dr. Lisbeth Renshaw discusses the pathology findings and reviews the nature of noninvasive breast disease. The consensus from the breast conference includes genetic testing and depending on her results and decision making, she would also be a candidate for breast conservation with lumpectomy. If she proceeded with breast conserving surgery, she would also benefit from adjuvant radiotherapy to the breast. She is also in the process of being set up with medical oncology and requests Dr. Nicholas Lose as her doctor. I have reached out to Bary Castilla, RN and Iris Pert, RN in the breast navigation office to coordinate with her. Dr. Lindi Adie anticipates a role for antiestrogen therapy in the adjuvant setting as well. We discussed the risks, benefits, short, and long term effects of radiotherapy, if she has lumpectomy. Dr. Lisbeth Renshaw discusses the delivery and logistics of radiotherapy and anticipates a course of 4 weeks of radiotherapy but if she chose mastectomy, we would not anticipate a role for radiotherapy. We will follow up with her surgical decision making and see her back as indicated. 2. Possible genetic predisposition to malignancy. The patient is a candidate for genetic testing given her  personal and family history. She also had endometrial hyperplasia treated with hysterectomy. She is unsure about the details of her mother's cancer involving the GI tract and liver. She was offered referral and is interested in this referral. Orders were placed. 3. Bilateral breast discharge. As above her symptoms have been persistent and progressive in both breasts over the last three years and for this reason in addition to family history, she is contemplating bilateral mastectomies. 4. Possible interest in reconstruction. If the patient does proceed with bilateral mastectomies she may be interested in speaking with Dr. Tressa Busman at [facility] in the Plastic Surgery department about DIEP flap reconstruction. She will make further surgical plans with Dr. [name] but may need referral  depending on her surgical decision making about the breast. 5. History of left upper lobe nodule with history of coccidioidomycosis. The patient is interested in a referral to pulmonary medicine and a referral to Dr. June Leap was placed.    Given current concerns for patient exposure during the COVID-19 pandemic, this encounter was conducted via telephone.  The patient has given verbal consent for this type of encounter. The time spent during this encounter was 45 minutes and 50% of that time was spent in the coordination of her care. The attendants for this meeting include Dr. Lisbeth Renshaw, Shona Simpson, [facility] and Matilde Haymaker  During the encounter, Dr. Lisbeth Renshaw and Shona Simpson [facility] were located at [facility] Radiation Oncology Department.  WYLEE OGDEN  was located at home.    The above documentation reflects my direct findings during this shared patient visit. Please see the separate note by Dr. Lisbeth Renshaw on this date for the remainder of the patient's plan of care.    Carola Rhine, PAC

## 2019-02-18 ENCOUNTER — Other Ambulatory Visit: Payer: Self-pay | Admitting: General Surgery

## 2019-02-18 ENCOUNTER — Other Ambulatory Visit: Payer: Self-pay | Admitting: *Deleted

## 2019-02-18 ENCOUNTER — Telehealth: Payer: Self-pay | Admitting: Genetic Counselor

## 2019-02-18 ENCOUNTER — Telehealth: Payer: Self-pay | Admitting: Licensed Clinical Social Worker

## 2019-02-18 DIAGNOSIS — C50911 Malignant neoplasm of unspecified site of right female breast: Secondary | ICD-10-CM

## 2019-02-18 DIAGNOSIS — D0511 Intraductal carcinoma in situ of right breast: Secondary | ICD-10-CM

## 2019-02-18 NOTE — Telephone Encounter (Signed)
Spoke with Miranda Fuller about her genetic counseling appointment, which has been rescheduled to Tuesday (12/8) at 2pm after her appointment with Dr. Lindi Adie. We also discussed the purpose of having genetic counseling prior to genetic testing.

## 2019-02-18 NOTE — Telephone Encounter (Signed)
Called patient regarding upcoming virtual visit, patient doesn't have access to do virtual visit. This will be a walk-in visit.

## 2019-02-21 ENCOUNTER — Inpatient Hospital Stay: Payer: Medicare Other | Admitting: Licensed Clinical Social Worker

## 2019-02-21 ENCOUNTER — Inpatient Hospital Stay: Payer: Medicare Other

## 2019-02-21 ENCOUNTER — Other Ambulatory Visit: Payer: Self-pay | Admitting: Radiation Oncology

## 2019-02-21 NOTE — Progress Notes (Signed)
[place] CONSULT NOTE  Patient Care Team: Doree Albee, MD as PCP - General (Internal Medicine) Burnell Blanks, MD as PCP - Cardiology (Cardiology)  CHIEF COMPLAINTS/PURPOSE OF CONSULTATION:  Newly diagnosed breast cancer  HISTORY OF PRESENTING ILLNESS:  Miranda WEDDING 68 y.o. female is here because of recent diagnosis of right breast ductal carcinoma in situ. The patient noted bilateral dark red spontaneous nipple discharge and a left breast lump was palpable on exam. Mammogram on 12/30/18 showed calcifications in the right breast spanning 3.27mm and no correlate for the previously palpable left breast lump. Breast MRI on 01/28/19 showed no evidence of malignancy bilaterally. Right breast biopsy on 02/07/19 showed intermediate grade ductal carcinoma in situ, ER+ 100%, PR+ 70%. She presents to the clinic today for initial evaluation and discussion of treatment options.   I reviewed her records extensively and collaborated the history with the patient.  SUMMARY OF ONCOLOGIC HISTORY: Oncology History  Ductal carcinoma in situ (DCIS) of right breast  12/27/2018 Initial Diagnosis   Patient noted bilateral dark red spontaneous nipple discharge and a left breast lump palpable on exam. Mammogram showed calcifications in the right breast spanning 3.61mm and no correlate for the previously palpable left breast lump. Breast MRI showed no evidence of malignancy bilaterally. Right breast biopsy showed intermediate grade DCIS, ER+ 100%, PR+ 70%.      MEDICAL HISTORY:  Past Medical History:  Diagnosis Date  . Breast mass 12/27/2018  . Coccidioidomycosis, pulmonary ([place])   . DDD (degenerative disc disease), cervical   . Dementia ([place])    Early per Dr. Luan Pulling  . Depression 2009  . DM (diabetes mellitus screen) 2009  . Glaucoma   . HTN (hypertension)   . Morbid obesity ([place])   . Nodule of upper lobe of left lung 12/27/2018  . Sleep apnea   . Type II diabetes  mellitus, uncontrolled ([place]) 01/31/2019    SURGICAL HISTORY: Past Surgical History:  Procedure Laterality Date  . ABDOMINAL HYSTERECTOMY     TAH BSO  . BREAST BIOPSY Right   . CHOLECYSTECTOMY    . TUBAL LIGATION    . UVULOPALATOPHARYNGOPLASTY      SOCIAL HISTORY: Social History   Socioeconomic History  . Marital status: Married    Spouse name: Not on file  . Number of children: 3  . Years of education: Not on file  . Highest education level: Not on file  Occupational History  . Occupation: Retired-Daycare  Social Needs  . Financial resource strain: Not on file  . Food insecurity    Worry: Not on file    Inability: Not on file  . Transportation needs    Medical: Not on file    Non-medical: Not on file  Tobacco Use  . Smoking status: Never Smoker  . Smokeless tobacco: Never Used  Substance and Sexual Activity  . Alcohol use: Yes    Comment: social  . Drug use: No  . Sexual activity: Not on file  Lifestyle  . Physical activity    Days per week: Not on file    Minutes per session: Not on file  . Stress: Not on file  Relationships  . Social Herbalist on phone: Not on file    Gets together: Not on file    Attends religious service: Not on file    Active member of club or organization: Not on file    Attends meetings of clubs or organizations: Not on file  Relationship status: Not on file  . Intimate partner violence    Fear of current or ex partner: Not on file    Emotionally abused: Not on file    Physically abused: Not on file    Forced sexual activity: Not on file  Other Topics Concern  . Not on file  Social History Narrative   Lives at home with children. Married for 48 years.Lives with husband.Retired.    FAMILY HISTORY: Family History  Problem Relation Age of Onset  . Heart disease Mother        "enlarged heart and leaking valves"  . COPD Mother   . Diabetes Mother   . Breast cancer Mother   . Cancer Mother        cancer involving  the colon, esophagus, and liver  . Peripheral vascular disease Father 34  . Heart disease Father   . Polycystic kidney disease Sister   . Hypertension Daughter   . Breast cancer Maternal Grandmother     ALLERGIES:  is allergic to oxycodone-acetaminophen; propoxyphene; demerol [meperidine]; morphine and related; and oxycodone-aspirin.  MEDICATIONS:  Current Outpatient Medications  Medication Sig Dispense Refill  . aspirin 81 MG tablet Take 1 tablet by mouth daily.    . Calcium Carbonate-Vit D-Min (CALCIUM 1200 PO) Take 1,200 mg by mouth 2 (two) times daily.    . Cholecalciferol (VITAMIN D-3) 25 MCG (1000 UT) CAPS Take 3 capsules by mouth daily.    . insulin aspart (NOVOLOG) 100 UNIT/ML injection Inject 5-15 Units into the skin 3 (three) times daily with meals. 5-15 units on sliding scale     . insulin glargine (LANTUS) 100 UNIT/ML injection Inject 52 Units into the skin at bedtime.     . isosorbide mononitrate (IMDUR) 30 MG 24 hr tablet Take 1 tablet (30 mg total) by mouth daily. 90 tablet 1  . lisinopril-hydrochlorothiazide (PRINZIDE,ZESTORETIC) 10-12.5 MG tablet Take 1 tablet by mouth daily.    . Multiple Vitamins-Minerals (MULTIVITAMIN ADULT PO) Take by mouth daily.    . pantoprazole (PROTONIX) 40 MG tablet Take 1 tablet (40 mg total) by mouth daily. 30 tablet 3  . Pediatric Multivitamins-Iron Vicki Mallet W/IRON PO) Take 2 tablets by mouth daily.    . vitamin B-12 (CYANOCOBALAMIN) 500 MCG tablet Take 1,000 mcg by mouth 2 (two) times daily.     No current facility-administered medications for this visit.     REVIEW OF SYSTEMS:   Constitutional: Denies fevers, chills or abnormal night sweats Eyes: Denies blurriness of vision, double vision or watery eyes Ears, nose, mouth, throat, and face: Denies mucositis or sore throat Respiratory: Denies cough, dyspnea or wheezes Cardiovascular: Denies palpitation, chest discomfort or lower extremity swelling Gastrointestinal:  Denies nausea,  heartburn or change in bowel habits Skin: Denies abnormal skin rashes Lymphatics: Denies new lymphadenopathy or easy bruising Neurological:Denies numbness, tingling or new weaknesses Behavioral/Psych: Mood is stable, no new changes  Breast: Denies any palpable lumps or discharge All other systems were reviewed with the patient and are negative.  PHYSICAL EXAMINATION: ECOG PERFORMANCE STATUS: 1 - Symptomatic but completely ambulatory  Vitals:   02/22/19 1248  BP: (!) 149/75  Pulse: 76  Resp: 17  Temp: 98.2 F (36.8 C)  SpO2: 97%   Filed Weights   02/22/19 1248  Weight: 228 lb (103.4 kg)    GENERAL:alert, no distress and comfortable SKIN: skin color, texture, turgor are normal, no rashes or significant lesions EYES: normal, conjunctiva are pink and non-injected, sclera clear OROPHARYNX:no exudate, no erythema  and lips, buccal mucosa, and tongue normal  NECK: supple, thyroid normal size, non-tender, without nodularity LYMPH:  no palpable lymphadenopathy in the cervical, axillary or inguinal LUNGS: clear to auscultation and percussion with normal breathing effort HEART: regular rate & rhythm and no murmurs and no lower extremity edema ABDOMEN:abdomen soft, non-tender and normal bowel sounds Musculoskeletal:no cyanosis of digits and no clubbing  PSYCH: alert & oriented x 3 with fluent speech NEURO: no focal motor/sensory deficits BREAST: No palpable nodules in breast. No palpable axillary or supraclavicular lymphadenopathy (exam performed in the presence of a chaperone)   LABORATORY DATA:  I have reviewed the data as listed Lab Results  Component Value Date   WBC 8.1 02/05/2010   HGB 14.3 02/05/2010   HCT 40.9 02/05/2010   MCV 92.9 02/05/2010   PLT 230 02/05/2010   Lab Results  Component Value Date   NA 137 01/31/2019   K 4.0 01/31/2019   CL 102 01/31/2019   CO2 24 01/31/2019    RADIOGRAPHIC STUDIES: I have personally reviewed the radiological reports and agreed  with the findings in the report.  ASSESSMENT AND PLAN:  Ductal carcinoma in situ (DCIS) of right breast 12/27/2018:Patient noted bilateral dark red spontaneous nipple discharge and a left breast lump palpable on exam. Mammogram showed calcifications in the right breast spanning 3.12mm and no correlate for the previously palpable left breast lump. Breast MRI showed no evidence of malignancy bilaterally. Right breast biopsy showed intermediate grade DCIS, ER+ 100%, PR+ 70%.   Pathology review: I discussed with the patient the difference between DCIS and invasive breast cancer. It is considered a precancerous lesion. DCIS is classified as a 0. It is generally detected through mammograms as calcifications. We discussed the significance of grades and its impact on prognosis. We also discussed the importance of ER and PR receptors and their implications to adjuvant treatment options. Prognosis of DCIS dependence on grade, comedo necrosis. It is anticipated that if not treated, 20-30% of DCIS can develop into invasive breast cancer.  Recommendation: 1. Breast conserving surgery 2. Followed by adjuvant radiation therapy 3. Followed by antiestrogen therapy with tamoxifen 5 years 4.  Genetic testing to be done today.  Patient is of opinion that if she has an abnormal genetic test then she will consider doing bilateral mastectomies.  Tamoxifen counseling: We discussed the risks and benefits of tamoxifen. These include but not limited to insomnia, hot flashes, mood changes, vaginal dryness, and weight gain. Although rare, serious side effects including endometrial cancer, risk of blood clots were also discussed. We strongly believe that the benefits far outweigh the risks. Patient understands these risks and consented to starting treatment. Planned treatment duration is 5 years.  Return to clinic after surgery to discuss the final pathology report and come up with an adjuvant treatment plan.      All  questions were answered. The patient knows to call the clinic with any problems, questions or concerns.   Rulon Eisenmenger, MD, MPH 02/22/2019    I, Molly Dorshimer, am acting as scribe for Nicholas Lose, MD.  I have reviewed the above documentation for accuracy and completeness, and I agree with the above.

## 2019-02-22 ENCOUNTER — Other Ambulatory Visit: Payer: Self-pay | Admitting: Genetic Counselor

## 2019-02-22 ENCOUNTER — Inpatient Hospital Stay: Payer: Medicare Other | Attending: Hematology and Oncology | Admitting: Hematology and Oncology

## 2019-02-22 ENCOUNTER — Encounter: Payer: Self-pay | Admitting: Genetic Counselor

## 2019-02-22 ENCOUNTER — Telehealth: Payer: Self-pay | Admitting: *Deleted

## 2019-02-22 ENCOUNTER — Inpatient Hospital Stay: Payer: Medicare Other

## 2019-02-22 ENCOUNTER — Other Ambulatory Visit: Payer: Self-pay

## 2019-02-22 ENCOUNTER — Ambulatory Visit (HOSPITAL_BASED_OUTPATIENT_CLINIC_OR_DEPARTMENT_OTHER): Payer: Medicare Other | Admitting: Genetic Counselor

## 2019-02-22 VITALS — BP 149/75 | HR 76 | Temp 98.2°F | Resp 17 | Ht 64.0 in | Wt 228.0 lb

## 2019-02-22 DIAGNOSIS — Z800 Family history of malignant neoplasm of digestive organs: Secondary | ICD-10-CM | POA: Insufficient documentation

## 2019-02-22 DIAGNOSIS — Z803 Family history of malignant neoplasm of breast: Secondary | ICD-10-CM

## 2019-02-22 DIAGNOSIS — Z794 Long term (current) use of insulin: Secondary | ICD-10-CM | POA: Diagnosis not present

## 2019-02-22 DIAGNOSIS — Z170 Estrogen receptor positive status [ER+]: Secondary | ICD-10-CM

## 2019-02-22 DIAGNOSIS — D0511 Intraductal carcinoma in situ of right breast: Secondary | ICD-10-CM

## 2019-02-22 DIAGNOSIS — Z808 Family history of malignant neoplasm of other organs or systems: Secondary | ICD-10-CM | POA: Insufficient documentation

## 2019-02-22 DIAGNOSIS — I10 Essential (primary) hypertension: Secondary | ICD-10-CM | POA: Diagnosis not present

## 2019-02-22 DIAGNOSIS — E119 Type 2 diabetes mellitus without complications: Secondary | ICD-10-CM | POA: Diagnosis not present

## 2019-02-22 DIAGNOSIS — Z780 Asymptomatic menopausal state: Secondary | ICD-10-CM

## 2019-02-22 DIAGNOSIS — Z801 Family history of malignant neoplasm of trachea, bronchus and lung: Secondary | ICD-10-CM | POA: Diagnosis not present

## 2019-02-22 LAB — CMP (CANCER CENTER ONLY)
ALT: 12 U/L (ref 0–44)
AST: 13 U/L — ABNORMAL LOW (ref 15–41)
Albumin: 3.7 g/dL (ref 3.5–5.0)
Alkaline Phosphatase: 69 U/L (ref 38–126)
Anion gap: 9 (ref 5–15)
BUN: 17 mg/dL (ref 8–23)
CO2: 26 mmol/L (ref 22–32)
Calcium: 9 mg/dL (ref 8.9–10.3)
Chloride: 103 mmol/L (ref 98–111)
Creatinine: 0.8 mg/dL (ref 0.44–1.00)
GFR, Est AFR Am: >60 mL/min (ref >60)
GFR, Estimated: >60 mL/min (ref >60)
Glucose, Bld: 219 mg/dL — ABNORMAL HIGH (ref 70–99)
Potassium: 4.3 mmol/L (ref 3.5–5.1)
Sodium: 138 mmol/L (ref 135–145)
Total Bilirubin: 0.5 mg/dL (ref 0.3–1.2)
Total Protein: 7.2 g/dL (ref 6.5–8.1)

## 2019-02-22 LAB — CBC WITH DIFFERENTIAL (CANCER CENTER ONLY)
Abs Immature Granulocytes: 0.02 10*3/uL (ref 0.00–0.07)
Basophils Absolute: 0.1 10*3/uL (ref 0.0–0.1)
Basophils Relative: 1 %
Eosinophils Absolute: 0.1 10*3/uL (ref 0.0–0.5)
Eosinophils Relative: 1 %
HCT: 43.4 % (ref 36.0–46.0)
Hemoglobin: 14.5 g/dL (ref 12.0–15.0)
Immature Granulocytes: 0 %
Lymphocytes Relative: 36 %
Lymphs Abs: 2.8 10*3/uL (ref 0.7–4.0)
MCH: 31.6 pg (ref 26.0–34.0)
MCHC: 33.4 g/dL (ref 30.0–36.0)
MCV: 94.6 fL (ref 80.0–100.0)
Monocytes Absolute: 0.7 10*3/uL (ref 0.1–1.0)
Monocytes Relative: 9 %
Neutro Abs: 4.1 10*3/uL (ref 1.7–7.7)
Neutrophils Relative %: 53 %
Platelet Count: 246 10*3/uL (ref 150–400)
RBC: 4.59 MIL/uL (ref 3.87–5.11)
RDW: 12.3 % (ref 11.5–15.5)
WBC Count: 7.7 10*3/uL (ref 4.0–10.5)
nRBC: 0 % (ref 0.0–0.2)

## 2019-02-22 NOTE — Assessment & Plan Note (Signed)
12/27/2018:Patient noted bilateral dark red spontaneous nipple discharge and a left breast lump palpable on exam. Mammogram showed calcifications in the right breast spanning 3.65mm and no correlate for the previously palpable left breast lump. Breast MRI showed no evidence of malignancy bilaterally. Right breast biopsy showed intermediate grade DCIS, ER+ 100%, PR+ 70%.   Pathology review: I discussed with the patient the difference between DCIS and invasive breast cancer. It is considered a precancerous lesion. DCIS is classified as a 0. It is generally detected through mammograms as calcifications. We discussed the significance of grades and its impact on prognosis. We also discussed the importance of ER and PR receptors and their implications to adjuvant treatment options. Prognosis of DCIS dependence on grade, comedo necrosis. It is anticipated that if not treated, 20-30% of DCIS can develop into invasive breast cancer.  Recommendation: 1. Breast conserving surgery 2. Followed by adjuvant radiation therapy 3. Followed by antiestrogen therapy with tamoxifen 5 years  Tamoxifen counseling: We discussed the risks and benefits of tamoxifen. These include but not limited to insomnia, hot flashes, mood changes, vaginal dryness, and weight gain. Although rare, serious side effects including endometrial cancer, risk of blood clots were also discussed. We strongly believe that the benefits far outweigh the risks. Patient understands these risks and consented to starting treatment. Planned treatment duration is 5 years.  Return to clinic after surgery to discuss the final pathology report and come up with an adjuvant treatment plan.

## 2019-02-22 NOTE — Telephone Encounter (Signed)
Called patient to inform of appt. with Dr. June Leap on 02-23-19 - arrival time- 9:45 am, address- [address], ph. - [phone], lvm for a return call

## 2019-02-23 ENCOUNTER — Encounter: Payer: Self-pay | Admitting: *Deleted

## 2019-02-23 ENCOUNTER — Institutional Professional Consult (permissible substitution): Payer: Medicare Other | Admitting: Pulmonary Disease

## 2019-02-23 ENCOUNTER — Encounter: Payer: Self-pay | Admitting: Genetic Counselor

## 2019-02-23 LAB — GENETIC SCREENING ORDER

## 2019-02-23 NOTE — Progress Notes (Deleted)
Synopsis: Referred in December 2020 for pulmonary nodule by Hayden Pedro,*  Subjective:   PATIENT ID: Miranda Fuller GENDER: female DOB: Mar 17, 1951, MRN: [id number]  No chief complaint on file.   This is a 68 year old female with a past medical history of coccidiomycosis, hypertension, hyperlipidemia, morbid obesity, type 2 diabetes   ***  Past Medical History:  Diagnosis Date  . Breast mass 12/27/2018  . Coccidioidomycosis, pulmonary ([place])   . DDD (degenerative disc disease), cervical   . Dementia ([place])    Early per Dr. Luan Pulling  . Depression 2009  . DM (diabetes mellitus screen) 2009  . Family history of bone cancer   . Family history of brain cancer   . Family history of breast cancer   . Family history of esophageal cancer   . Family history of lung cancer   . Glaucoma   . HTN (hypertension)   . Morbid obesity ([place])   . Nodule of upper lobe of left lung 12/27/2018  . Sleep apnea   . Type II diabetes mellitus, uncontrolled ([place]) 01/31/2019     Family History  Problem Relation Age of Onset  . Heart disease Mother        "enlarged heart and leaking valves"  . COPD Mother   . Diabetes Mother   . Breast cancer Mother 24  . Cancer Mother        cancer involving the colon, esophagus, and liver  . Peripheral vascular disease Father 66  . Heart disease Father   . Polycystic kidney disease Sister   . Hypertension Daughter   . Breast cancer Maternal Grandmother      Past Surgical History:  Procedure Laterality Date  . ABDOMINAL HYSTERECTOMY     TAH BSO  . BREAST BIOPSY Right   . CHOLECYSTECTOMY    . TUBAL LIGATION    . UVULOPALATOPHARYNGOPLASTY      Social History   Socioeconomic History  . Marital status: Married    Spouse name: Not on file  . Number of children: 3  . Years of education: Not on file  . Highest education level: Not on file  Occupational History  . Occupation: Retired-Daycare  Social Needs  . Financial resource strain: Not on  file  . Food insecurity    Worry: Not on file    Inability: Not on file  . Transportation needs    Medical: Not on file    Non-medical: Not on file  Tobacco Use  . Smoking status: Never Smoker  . Smokeless tobacco: Never Used  Substance and Sexual Activity  . Alcohol use: Yes    Comment: social  . Drug use: No  . Sexual activity: Not on file  Lifestyle  . Physical activity    Days per week: Not on file    Minutes per session: Not on file  . Stress: Not on file  Relationships  . Social Herbalist on phone: Not on file    Gets together: Not on file    Attends religious service: Not on file    Active member of club or organization: Not on file    Attends meetings of clubs or organizations: Not on file    Relationship status: Not on file  . Intimate partner violence    Fear of current or ex partner: Not on file    Emotionally abused: Not on file    Physically abused: Not on file    Forced sexual activity: Not on file  Other Topics Concern  . Not on file  Social History Narrative   Lives at home with children. Married for 48 years.Lives with husband.Retired.     Allergies  Allergen Reactions  . Oxycodone-Acetaminophen Other (See Comments), Itching, Rash and Hives    Other reaction(s): Hallucinations  . Propoxyphene Other (See Comments), Hives, Itching and Rash    Other reaction(s): Delusions (intolerance)  . Demerol [Meperidine] Other (See Comments)    hallucinations  . Morphine And Related Hives  . Oxycodone-Aspirin Other (See Comments) and Hives     Outpatient Medications Prior to Visit  Medication Sig Dispense Refill  . aspirin 81 MG tablet Take 1 tablet by mouth daily.    . Calcium Carbonate-Vit D-Min (CALCIUM 1200 PO) Take 1,200 mg by mouth 2 (two) times daily.    . Cholecalciferol (VITAMIN D-3) 25 MCG (1000 UT) CAPS Take 3 capsules by mouth daily.    . insulin aspart (NOVOLOG) 100 UNIT/ML injection Inject 5-15 Units into the skin 3 (three) times  daily with meals. 5-15 units on sliding scale     . insulin glargine (LANTUS) 100 UNIT/ML injection Inject 52 Units into the skin at bedtime.     . isosorbide mononitrate (IMDUR) 30 MG 24 hr tablet Take 1 tablet (30 mg total) by mouth daily. 90 tablet 1  . lisinopril-hydrochlorothiazide (PRINZIDE,ZESTORETIC) 10-12.5 MG tablet Take 1 tablet by mouth daily.    . Multiple Vitamins-Minerals (MULTIVITAMIN ADULT PO) Take by mouth daily.    . pantoprazole (PROTONIX) 40 MG tablet Take 1 tablet (40 mg total) by mouth daily. 30 tablet 3  . Pediatric Multivitamins-Iron Vicki Mallet W/IRON PO) Take 2 tablets by mouth daily.    . vitamin B-12 (CYANOCOBALAMIN) 500 MCG tablet Take 1,000 mcg by mouth 2 (two) times daily.     No facility-administered medications prior to visit.     ROS   Objective:  Physical Exam   There were no vitals filed for this visit.   on *** LPM *** RA BMI Readings from Last 3 Encounters:  02/22/19 39.14 kg/m  01/31/19 37.56 kg/m  12/27/18 38.42 kg/m   Wt Readings from Last 3 Encounters:  02/22/19 228 lb (103.4 kg)  01/31/19 218 lb 12.8 oz (99.2 kg)  12/27/18 223 lb 12.8 oz (101.5 kg)     CBC    Component Value Date/Time   WBC 7.7 02/22/2019 1459   WBC 8.1 02/05/2010 0450   RBC 4.59 02/22/2019 1459   HGB 14.5 02/22/2019 1459   HCT 43.4 02/22/2019 1459   PLT 246 02/22/2019 1459   MCV 94.6 02/22/2019 1459   MCH 31.6 02/22/2019 1459   MCHC 33.4 02/22/2019 1459   RDW 12.3 02/22/2019 1459   LYMPHSABS 2.8 02/22/2019 1459   MONOABS 0.7 02/22/2019 1459   EOSABS 0.1 02/22/2019 1459   BASOSABS 0.1 02/22/2019 1459     Chest Imaging: CT chest March 2018: Stable 9 mm noncalcified nodule in the left upper lobe compared to a prior dictated report from 2003 however no direct comparison available. The patient's images have been independently reviewed by me.    Pulmonary Functions Testing Results: No flowsheet data found.  FeNO: None  Pathology:  2020: Right  breast DCIS ER/PR+   Echocardiogram:  - Left ventricle: The cavity size was normal. Wall thickness was   increased in a pattern of mild LVH. Systolic function was normal.   The estimated ejection fraction was in the range of 60% to 65%.   Wall motion was normal; there  were no regional wall motion   abnormalities. Features are consistent with a pseudonormal left   ventricular filling pattern, with concomitant abnormal relaxation   and increased filling pressure (grade 2 diastolic dysfunction).   Doppler parameters are consistent with high ventricular filling   pressure. - Aortic valve: Trileaflet; mildly thickened, mildly calcified   leaflets. Sclerosis without stenosis. - Mitral valve: Mildly calcified annulus. - Left atrium: The atrium was moderately dilated.  Heart Catheterization: None     Assessment & Plan:     ICD-10-CM   1. Lung nodule  R91.1   2. History of coccidioidomycosis  Z86.19   3. Ductal carcinoma in situ (DCIS) of right breast  D05.11     Discussion: ***   Current Outpatient Medications:  .  aspirin 81 MG tablet, Take 1 tablet by mouth daily., Disp: , Rfl:  .  Calcium Carbonate-Vit D-Min (CALCIUM 1200 PO), Take 1,200 mg by mouth 2 (two) times daily., Disp: , Rfl:  .  Cholecalciferol (VITAMIN D-3) 25 MCG (1000 UT) CAPS, Take 3 capsules by mouth daily., Disp: , Rfl:  .  insulin aspart (NOVOLOG) 100 UNIT/ML injection, Inject 5-15 Units into the skin 3 (three) times daily with meals. 5-15 units on sliding scale , Disp: , Rfl:  .  insulin glargine (LANTUS) 100 UNIT/ML injection, Inject 52 Units into the skin at bedtime. , Disp: , Rfl:  .  isosorbide mononitrate (IMDUR) 30 MG 24 hr tablet, Take 1 tablet (30 mg total) by mouth daily., Disp: 90 tablet, Rfl: 1 .  lisinopril-hydrochlorothiazide (PRINZIDE,ZESTORETIC) 10-12.5 MG tablet, Take 1 tablet by mouth daily., Disp: , Rfl:  .  Multiple Vitamins-Minerals (MULTIVITAMIN ADULT PO), Take by mouth daily., Disp: , Rfl:  .   pantoprazole (PROTONIX) 40 MG tablet, Take 1 tablet (40 mg total) by mouth daily., Disp: 30 tablet, Rfl: 3 .  Pediatric Multivitamins-Iron Vicki Mallet W/IRON PO), Take 2 tablets by mouth daily., Disp: , Rfl:  .  vitamin B-12 (CYANOCOBALAMIN) 500 MCG tablet, Take 1,000 mcg by mouth 2 (two) times daily., Disp: , Rfl:    Garner Nash, DO [facility] 02/23/2019 8:25 AM

## 2019-02-23 NOTE — Progress Notes (Signed)
REFERRING PROVIDER: Jovita Kussmaul, MD [place],  [place] [id number]  PRIMARY PROVIDER:  Doree Albee, MD  PRIMARY REASON FOR VISIT:  1. Ductal carcinoma in situ (DCIS) of right breast   2. Family history of breast cancer   3. Family history of esophageal cancer   4. Family history of lung cancer   5. Family history of brain cancer   6. Family history of bone cancer      HISTORY OF PRESENT ILLNESS:   Miranda Fuller, a 68 y.o. female, was seen for a [facility] cancer genetics consultation at the request of Dr. [name] due to a personal and family history of breast cancer.  Miranda Fuller presents to clinic today to discuss the possibility of a hereditary predisposition to cancer, genetic testing, and to further clarify her future cancer risks, as well as potential cancer risks for family members.   In 2020, at the age of 75, Miranda Fuller was diagnosed with DCIS of the right breast.    CANCER HISTORY:  Oncology History  Ductal carcinoma in situ (DCIS) of right breast  12/27/2018 Initial Diagnosis   Patient noted bilateral dark red spontaneous nipple discharge and a left breast lump palpable on exam. Mammogram showed calcifications in the right breast spanning 3.1m and no correlate for the previously palpable left breast lump. Breast MRI showed no evidence of malignancy bilaterally. Right breast biopsy showed intermediate grade DCIS, ER+ 100%, PR+ 70%.       RISK FACTORS:  Menarche was at age 68  First live birth at age 68  OCP use for approximately 1 years.  Ovaries intact: no, ovaries removed 27 years ago.  Hysterectomy: yes, 27 years ago.  Menopausal status: postmenopausal.  HRT use: 1 month  Colonoscopy: yes; 3 polyps, per patient. Mammogram within the last year: yes. Number of breast biopsies: 2. Any excessive radiation exposure in the past: no  Past Medical History:  Diagnosis Date  . Breast mass 12/27/2018  . Coccidioidomycosis, pulmonary ([facility]   . DDD  (degenerative disc disease), cervical   . Dementia ([facility]    Early per Dr. HLuan Pulling . Depression 2009  . DM (diabetes mellitus screen) 2009  . Family history of bone cancer   . Family history of brain cancer   . Family history of breast cancer   . Family history of esophageal cancer   . Family history of lung cancer   . Glaucoma   . HTN (hypertension)   . Morbid obesity ([facility]   . Nodule of upper lobe of left lung 12/27/2018  . Sleep apnea   . Type II diabetes mellitus, uncontrolled ([facility] 01/31/2019    Past Surgical History:  Procedure Laterality Date  . ABDOMINAL HYSTERECTOMY     TAH BSO  . BREAST BIOPSY Right   . CHOLECYSTECTOMY    . TUBAL LIGATION    . UVULOPALATOPHARYNGOPLASTY      Social History   Socioeconomic History  . Marital status: Married    Spouse name: Not on file  . Number of children: 3  . Years of education: Not on file  . Highest education level: Not on file  Occupational History  . Occupation: Retired-Daycare  Social Needs  . Financial resource strain: Not on file  . Food insecurity    Worry: Not on file    Inability: Not on file  . Transportation needs    Medical: Not on file    Non-medical: Not on file  Tobacco Use  . Smoking status: Never Smoker  . Smokeless tobacco: Never Used  Substance and Sexual Activity  . Alcohol use: Yes    Comment: social  . Drug use: No  . Sexual activity: Not on file  Lifestyle  . Physical activity    Days per week: Not on file    Minutes per session: Not on file  . Stress: Not on file  Relationships  . Social Herbalist on phone: Not on file    Gets together: Not on file    Attends religious service: Not on file    Active member of club or organization: Not on file    Attends meetings of clubs or organizations: Not on file    Relationship status: Not on file  Other Topics Concern  . Not on file  Social History Narrative   Lives at home with children. Married for 48 years.Lives with  husband.Retired.     FAMILY HISTORY:  We obtained a detailed, 4-generation family history.  Significant diagnoses are listed below: Family History  Problem Relation Age of Onset  . Heart disease Mother        "enlarged heart and leaking valves"  . COPD Mother   . Diabetes Mother   . Breast cancer Mother 54  . Cancer Mother        cancer involving the colon, esophagus, and liver  . Peripheral vascular disease Father 24  . Heart disease Father   . Diabetes Maternal Grandfather   . Polycystic kidney disease Sister   . Hypertension Daughter   . Breast cancer Maternal Grandmother 80  . Breast cancer Other 76       maternal grandmother's sister  . Brain cancer Other 68  . Lung cancer Other    Miranda Fuller has three daughters, ages 47, 63, and 63, none of whom have had cancer. She has one sister and two brothers. Her sister died at age 66 from polycystic kidney disease. This sister had a daughter who had leukemia when she was an infant, and then later died at age 83 due to polycystic kidney disease. One brother died due to homicide when he was 33, and her other brother is currently living at age 70 and has polycystic kidney disease, but does not have cancer.  Miranda Fuller mother is currently living at age 5 and has a history of breast cancer that was diagnosed at age 17. Her mother also has cancer in her esophagus and colon, although Miranda Fuller is unsure if these represent cancer that has metastasized, or if they are separate primary diagnoses of cancer. Her maternal grandfather died in his 42s from diabetes, and her grandfather had a nephew who was diagnosed with esophageal cancer in his early 93s and was not a smoker. Miranda Fuller maternal grandmother was diagnosed with breast cancer in her early 86s. She also has a maternal great-aunt (sister of her grandmother) who had breast cancer diagnosed at age 34. This great-aunt had a son with bone cancer in his 43s. She had two maternal great-uncles with  cancer - one with brain cancer diagnosed at age 59, and one with lung cancer. Ms. Amorin also has a 5th degree relative on this side of the family who had breast cancer diagnosed at age 15.  Ms. Barrell father died at age 35 due to a vascular disease. She has three paternal aunts who have not had cancer to Miranda Fuller knowledge, although she has limited information about this  side of the family. She does not know how old her paternal grandparents were when they passed away, or if they had cancer.   Miranda Fuller is unaware of previous family history of genetic testing for hereditary cancer risks. Her maternal ancestors are of [place] descent, and paternal ancestors are of Native [place] and [place] descent. There is no reported Ashkenazi Jewish ancestry. There is no known consanguinity.  GENETIC COUNSELING ASSESSMENT: Miranda Fuller is a 68 y.o. female with a personal and family history of breast cancer, which is somewhat suggestive of a hereditary cancer syndrome and predisposition to cancer. We, therefore, discussed and recommended the following at today's visit.   DISCUSSION: We discussed that approximately 5-10% of breast cancer is hereditary, with most cases associated with the BRCA genes.  There are other genes that can be associated with hereditary breast cancer syndromes.  These include ATM, CHEK2, PALB2, etc.  We discussed that testing is beneficial for several reasons including knowing about other cancer risks, identifying potential screening and risk-reduction options that may be appropriate, and to understand if other family members could be at risk for cancer and allow them to undergo genetic testing.   We reviewed the characteristics, features and inheritance patterns of hereditary cancer syndromes. We also discussed genetic testing, including the appropriate family members to test, the process of testing, insurance coverage and turn-around-time for results. We discussed the implications of a negative,  positive and/or variant of uncertain significant result. In order to get genetic test results in a timely manner so that Miranda Fuller can use these genetic test results for surgical decisions, we recommended Miranda Fuller pursue genetic testing for the [facility] Breast Cancer STAT panel. Once complete, we recommend Miranda Fuller pursue reflex genetic testing to the Multi-Cancer gene panel.  The STAT Breast cancer panel offered by [facility] includes sequencing and rearrangement analysis for the following 9 genes:  ATM, BRCA1, BRCA2, CDH1, CHEK2, PALB2, PTEN, STK11 and TP53.     The Multi-Cancer Panel offered by [facility] includes sequencing and/or deletion duplication testing of the following 85 genes: AIP, ALK, APC, ATM, AXIN2,BAP1,  BARD1, BLM, BMPR1A, BRCA1, BRCA2, BRIP1, CASR, CDC73, CDH1, CDK4, CDKN1B, CDKN1C, CDKN2A (p14ARF), CDKN2A (p16INK4a), CEBPA, CHEK2, CTNNA1, DICER1, DIS3L2, EGFR (c.2369C>T, p.Thr790Met variant only), EPCAM (Deletion/duplication testing only), FH, FLCN, GATA2, GPC3, GREM1 (Promoter region deletion/duplication testing only), HOXB13 (c.251G>A, p.Gly84Glu), HRAS, KIT, MAX, MEN1, MET, MITF (c.952G>A, p.Glu318Lys variant only), MLH1, MSH2, MSH3, MSH6, MUTYH, NBN, NF1, NF2, NTHL1, PALB2, PDGFRA, PHOX2B, PMS2, POLD1, POLE, POT1, PRKAR1A, PTCH1, PTEN, RAD50, RAD51C, RAD51D, RB1, RECQL4, RET, RNF43, RUNX1, SDHAF2, SDHA (sequence changes only), SDHB, SDHC, SDHD, SMAD4, SMARCA4, SMARCB1, SMARCE1, STK11, SUFU, TERC, TERT, TMEM127, TP53, TSC1, TSC2, VHL, WRN and WT1.     Based on Miranda Fuller personal and family history of cancer, she meets medical criteria for genetic testing. Despite that she meets criteria, she may still have an out of pocket cost.   PLAN: After considering the risks, benefits, and limitations, Miranda Fuller provided informed consent to pursue genetic testing and the blood sample was sent to [facility] for analysis of the Breast Cancer STAT panel + Multi-Cancer panel. Results should  be available within approximately one-two weeks' time, at which point they will be disclosed by telephone to Miranda Fuller, as will any additional recommendations warranted by these results. Miranda Fuller will receive a summary of her genetic counseling visit and a copy of her results once available. This information will also be available in Epic.   Ms.  Fuller's questions were answered to her satisfaction today. Our contact information was provided should additional questions or concerns arise. Thank you for the referral and allowing [place] to share in the care of your patient.   Clint Guy, MS, [facility] Certified Genetic Counselor [email or web address] Phone: [phone]  The patient was seen for a total of 50 minutes in face-to-face genetic counseling.  This patient was discussed with Drs. Magrinat, Lindi Adie and/or Burr Medico who agrees with the above.    _______________________________________________________________________ For Office Staff:  Number of people involved in session: 1 Was an Intern/ student involved with case: no

## 2019-02-25 ENCOUNTER — Ambulatory Visit
Admission: RE | Admit: 2019-02-25 | Discharge: 2019-02-25 | Disposition: A | Discharge status: Home / Self Care | Payer: Medicare Other | Source: Ambulatory Visit | Attending: General Surgery | Admitting: General Surgery

## 2019-02-25 DIAGNOSIS — C50911 Malignant neoplasm of unspecified site of right female breast: Secondary | ICD-10-CM

## 2019-02-25 MED ORDER — IOPAMIDOL (ISOVUE-300) INJECTION 61%
125.0000 mL | Freq: Once | INTRAVENOUS | Status: AC | PRN
  Administered 2019-02-25: 125 mL via INTRAVENOUS

## 2019-03-01 DIAGNOSIS — Z1379 Encounter for other screening for genetic and chromosomal anomalies: Secondary | ICD-10-CM | POA: Insufficient documentation

## 2019-03-02 ENCOUNTER — Encounter: Payer: Self-pay | Admitting: Genetic Counselor

## 2019-03-02 ENCOUNTER — Telehealth: Payer: Self-pay | Admitting: Genetic Counselor

## 2019-03-02 ENCOUNTER — Encounter: Payer: Self-pay | Admitting: *Deleted

## 2019-03-02 ENCOUNTER — Ambulatory Visit: Payer: Self-pay | Admitting: Genetic Counselor

## 2019-03-02 DIAGNOSIS — Z1379 Encounter for other screening for genetic and chromosomal anomalies: Secondary | ICD-10-CM

## 2019-03-02 NOTE — Telephone Encounter (Signed)
Revealed negative genetic testing.  Discussed that we do not know why she has breast cancer or why there is cancer in the family.  It could be due to a different gene that we are not testing, or our current technology may not be able detect something.  It will be important for her to keep in contact with genetics to keep up with whether additional testing may be appropriate in the future.   A variant of uncertain significance was detected in the APC gene called c.449A>G.  Her results are still considered normal and should not impact her medical management.

## 2019-03-02 NOTE — Progress Notes (Signed)
HPI:  Ms. Miranda Fuller was previously seen in the [place] clinic due to a personal and family history of breast cancer and concerns regarding a hereditary predisposition to cancer. Please refer to our prior cancer genetics clinic note for more information regarding our discussion, assessment and recommendations, at the time. Ms. Miranda Fuller recent genetic test results were disclosed to her, as were recommendations warranted by these results. These results and recommendations are discussed in more detail below.  CANCER HISTORY:  Oncology History  Ductal carcinoma in situ (DCIS) of right breast  12/27/2018 Initial Diagnosis   Patient noted bilateral dark red spontaneous nipple discharge and a left breast lump palpable on exam. Mammogram showed calcifications in the right breast spanning 3.73m and no correlate for the previously palpable left breast lump. Breast MRI showed no evidence of malignancy bilaterally. Right breast biopsy showed intermediate grade DCIS, ER+ 100%, PR+ 70%.    03/01/2019 Genetic Testing   Negative genetic testing:  No pathogenic variants detected on the [facility] Breast Cancer STAT panel or the [facility] Multi-Cancer panel. A variant of uncertain significance was detected in the APC gene called c.449A>G. The report date is 03/01/2019.  The Breast Cancer STAT panel offered by [facility] includes sequencing and rearrangement analysis for the following 9 genes:  ATM, BRCA1, BRCA2, CDH1, CHEK2, PALB2, PTEN, STK11 and TP53.  The Multi-Cancer Panel offered by [facility] includes sequencing and/or deletion duplication testing of the following 85 genes: AIP, ALK, APC, ATM, AXIN2,BAP1,  BARD1, BLM, BMPR1A, BRCA1, BRCA2, BRIP1, CASR, CDC73, CDH1, CDK4, CDKN1B, CDKN1C, CDKN2A (p14ARF), CDKN2A (p16INK4a), CEBPA, CHEK2, CTNNA1, DICER1, DIS3L2, EGFR (c.2369C>T, p.Thr790Met variant only), EPCAM (Deletion/duplication testing only), FH, FLCN, GATA2, GPC3, GREM1 (Promoter region deletion/duplication  testing only), HOXB13 (c.251G>A, p.Gly84Glu), HRAS, KIT, MAX, MEN1, MET, MITF (c.952G>A, p.Glu318Lys variant only), MLH1, MSH2, MSH3, MSH6, MUTYH, NBN, NF1, NF2, NTHL1, PALB2, PDGFRA, PHOX2B, PMS2, POLD1, POLE, POT1, PRKAR1A, PTCH1, PTEN, RAD50, RAD51C, RAD51D, RB1, RECQL4, RET, RNF43, RUNX1, SDHAF2, SDHA (sequence changes only), SDHB, SDHC, SDHD, SMAD4, SMARCA4, SMARCB1, SMARCE1, STK11, SUFU, TERC, TERT, TMEM127, TP53, TSC1, TSC2, VHL, WRN and WT1.       FAMILY HISTORY:  We obtained a detailed, 4-generation family history.  Significant diagnoses are listed below: Family History  Problem Relation Age of Onset  . Heart disease Mother        "enlarged heart and leaking valves"  . COPD Mother   . Diabetes Mother   . Breast cancer Mother 855 . Cancer Mother        cancer involving the colon, esophagus, and liver  . Peripheral vascular disease Father 511 . Heart disease Father   . Diabetes Maternal Grandfather   . Polycystic kidney disease Sister   . Hypertension Daughter   . Breast cancer Maternal Grandmother 80  . Breast cancer Other 734      maternal grandmother's sister  . Brain cancer Other 68  . Lung cancer Other    Ms. KRenwickhas three daughters, ages 461 4105 and 349 none of whom have had cancer. She has one sister and two brothers. Her sister died at age 3550from polycystic kidney disease. This sister had a daughter who had leukemia when she was an infant, and then later died at age 35329due to polycystic kidney disease. One brother died due to homicide when he was 418 and her other brother is currently living at age 355and has polycystic kidney disease, but does not have cancer.  Ms. KSwartzendrubermother  is currently living at age 9 and has a history of breast cancer that was diagnosed at age 69. Her mother also has cancer in her esophagus and colon, although Ms. Miranda Fuller is unsure if these represent cancer that has metastasized, or if they are separate primary diagnoses of cancer. Her maternal  grandfather died in his 33s from diabetes, and her grandfather had a nephew who was diagnosed with esophageal cancer in his early 44s and was not a smoker. Ms. Miranda Fuller maternal grandmother was diagnosed with breast cancer in her early 36s. She also has a maternal great-aunt (sister of her grandmother) who had breast cancer diagnosed at age 63. This great-aunt had a son with bone cancer in his 56s. She had two maternal great-uncles with cancer - one with brain cancer diagnosed at age 61, and one with lung cancer. Ms. Miranda Fuller also has a 5th degree relative on this side of the family who had breast cancer diagnosed at age 74.  Ms. Miranda Fuller father died at age 79 due to a vascular disease. She has three paternal aunts who have not had cancer to Ms. Miranda Fuller knowledge, although she has limited information about this side of the family. She does not know how old her paternal grandparents were when they passed away, or if they had cancer.   Ms. Miranda Fuller is unaware of previous family history of genetic testing for hereditary cancer risks. Her maternal ancestors are of [place] descent, and paternal ancestors are of Native [place] and [place] descent. There is no reported Ashkenazi Jewish ancestry. There is no known consanguinity.  GENETIC TEST RESULTS: Genetic testing reported out on 03/01/2019 through the [facility] Breast Cancer STAT panel + Multi-Cancer panel, which did not detect any pathogenic variants.   The STAT Breast cancer panel offered by [facility] includes sequencing and rearrangement analysis for the following 9 genes:  ATM, BRCA1, BRCA2, CDH1, CHEK2, PALB2, PTEN, STK11 and TP53.  The Multi-Cancer Panel offered by [facility] includes sequencing and/or deletion duplication testing of the following 85 genes: AIP, ALK, APC, ATM, AXIN2,BAP1,  BARD1, BLM, BMPR1A, BRCA1, BRCA2, BRIP1, CASR, CDC73, CDH1, CDK4, CDKN1B, CDKN1C, CDKN2A (p14ARF), CDKN2A (p16INK4a), CEBPA, CHEK2, CTNNA1, DICER1, DIS3L2, EGFR (c.2369C>T, p.Thr790Met  variant only), EPCAM (Deletion/duplication testing only), FH, FLCN, GATA2, GPC3, GREM1 (Promoter region deletion/duplication testing only), HOXB13 (c.251G>A, p.Gly84Glu), HRAS, KIT, MAX, MEN1, MET, MITF (c.952G>A, p.Glu318Lys variant only), MLH1, MSH2, MSH3, MSH6, MUTYH, NBN, NF1, NF2, NTHL1, PALB2, PDGFRA, PHOX2B, PMS2, POLD1, POLE, POT1, PRKAR1A, PTCH1, PTEN, RAD50, RAD51C, RAD51D, RB1, RECQL4, RET, RNF43, RUNX1, SDHAF2, SDHA (sequence changes only), SDHB, SDHC, SDHD, SMAD4, SMARCA4, SMARCB1, SMARCE1, STK11, SUFU, TERC, TERT, TMEM127, TP53, TSC1, TSC2, VHL, WRN and WT1.  The test report will be scanned into EPIC and located under the Molecular Pathology section of the Results Review tab.  A portion of the result report is included below for reference.    We discussed with Ms. Miranda Fuller that because current genetic testing is not perfect, it is possible there may be a gene mutation in one of these genes that current testing cannot detect, but that chance is small.  We also discussed, that there could be another gene that has not yet been discovered, or that we have not yet tested, that is responsible for the cancer diagnoses in the family. It is also possible there is a hereditary cause for the cancer in the family that Ms. Miranda Fuller did not inherit and therefore was not identified in her testing.  Therefore, it is important to remain in touch  with cancer genetics in the future so that we can continue to offer Ms. Miranda Fuller the most up to date genetic testing.   Genetic testing did identify a variant of uncertain significance (VUS) was identified in the APC gene called c.449A>G.  At this time, it is unknown if this variant is associated with increased cancer risk or if this is a normal finding, but most variants such as this get reclassified to being inconsequential. It should not be used to make medical management decisions. With time, we suspect the lab will determine the significance of this variant, if any. If we do  learn more about it, we will try to contact Ms. Miranda Fuller to discuss it further. However, it is important to stay in touch with [place] periodically and keep the address and phone number up to date.  CANCER SCREENING RECOMMENDATIONS: Ms. Miranda Fuller test result is considered negative (normal).  This means that we have not identified a hereditary cause for her personal and family history of cancer at this time.  While reassuring, this does not definitively rule out a hereditary predisposition to cancer. It is still possible that there could be genetic mutations that are undetectable by current technology. There could be genetic mutations in genes that have not been tested or identified to increase cancer risk.  Therefore, it is recommended she continue to follow the cancer management and screening guidelines provided by her oncology and primary healthcare provider.   An individual's cancer risk and medical management are not determined by genetic test results alone. Overall cancer risk assessment incorporates additional factors, including personal medical history, family history, and any available genetic information that may result in a personalized plan for cancer prevention and surveillance.  RECOMMENDATIONS FOR FAMILY MEMBERS:  Individuals in this family might be at some increased risk of developing cancer, over the general population risk, simply due to the family history of cancer.  We recommended women in this family have a yearly mammogram beginning at age 68, or 108 years younger than the earliest onset of cancer, an annual clinical breast exam, and perform monthly breast self-exams. Women in this family should also have a gynecological exam as recommended by their primary provider. All family members should have a colonoscopy by age 22.  It is also possible there is a hereditary cause for the cancer in Ms. Miranda Fuller family that she did not inherit and therefore was not identified in her.  Based on Ms. Miranda Fuller  family history, we recommended her mother, who was diagnosed with breast cancer at age 10, have genetic counseling and testing.  Ms. Miranda Fuller feels that her mother has too much on her plate to consider genetic testing at this time, although Ms. Miranda Fuller is welcome to let us know if we can be of any assistance in coordinating genetic counseling and/or testing for this family member.   FOLLOW-UP: Lastly, we discussed with Ms. Miranda Fuller that cancer genetics is a rapidly advancing field and it is possible that new genetic tests will be appropriate for her and/or her family members in the future. We encouraged her to remain in contact with cancer genetics on an annual basis so we can update her personal and family histories and let her know of advances in cancer genetics that may benefit this family.   Our contact number was provided. Ms. Miranda Fuller questions were answered to her satisfaction, and she knows she is welcome to call us at anytime with additional questions or concerns.   Clint Guy, MS, [facility] Certified Genetic  Counselor [email or web address] Phone: [phone]

## 2019-03-04 ENCOUNTER — Ambulatory Visit (HOSPITAL_COMMUNITY): Payer: Self-pay | Admitting: General Surgery

## 2019-03-04 DIAGNOSIS — D0511 Intraductal carcinoma in situ of right breast: Secondary | ICD-10-CM

## 2019-03-09 ENCOUNTER — Other Ambulatory Visit (HOSPITAL_COMMUNITY): Payer: Self-pay | Admitting: General Surgery

## 2019-03-09 ENCOUNTER — Telehealth: Payer: Self-pay | Admitting: Hematology and Oncology

## 2019-03-09 DIAGNOSIS — D0511 Intraductal carcinoma in situ of right breast: Secondary | ICD-10-CM

## 2019-03-09 NOTE — Telephone Encounter (Signed)
Scheduled appt per 12/23 sch message - mailed reminder letter with appt date and time

## 2019-03-14 ENCOUNTER — Other Ambulatory Visit: Payer: Self-pay

## 2019-03-14 ENCOUNTER — Encounter: Payer: Self-pay | Admitting: Internal Medicine

## 2019-03-14 ENCOUNTER — Ambulatory Visit: Payer: Medicare Other | Admitting: Internal Medicine

## 2019-03-14 DIAGNOSIS — B382 Pulmonary coccidioidomycosis, unspecified: Secondary | ICD-10-CM | POA: Diagnosis not present

## 2019-03-14 DIAGNOSIS — I10 Essential (primary) hypertension: Secondary | ICD-10-CM | POA: Diagnosis not present

## 2019-03-14 DIAGNOSIS — R0600 Dyspnea, unspecified: Secondary | ICD-10-CM | POA: Diagnosis not present

## 2019-03-14 DIAGNOSIS — R0609 Other forms of dyspnea: Secondary | ICD-10-CM

## 2019-03-14 MED ORDER — TELMISARTAN-HCTZ 40-12.5 MG PO TABS: 1.0000 | ORAL_TABLET | Freq: Every day | ORAL | 11 refills | Status: DC

## 2019-03-14 NOTE — Assessment & Plan Note (Signed)
Onset p valley fever age 68 s worse since 2017  - acei d/c 03/14/2019  - 03/14/2019   Walked RA x two laps =  approx 548ft @ nl/fast pace - stopped due to end of study with sats of 97% at the end of the study and no significant sob   Most likely this is conditioning plus acei related > f/u off acei if not improving

## 2019-03-14 NOTE — Assessment & Plan Note (Addendum)
Stop lisinopril 03/14/2019 due to globus   In the best review of chronic cough to date ( NEJM 2016 375 [id number]) ,  ACEi are now felt to cause cough in up to  20% of pts which is a 4 fold increase from previous reports and does not include the variety of non-specific complaints we see in pulmonary clinic in pts on ACEi but previously attributed to another dx like  Copd/asthma and  include PNDS, throat  congestion, "bronchitis", unexplained dyspnea and noct "strangling" sensations, and hoarseness, but also  atypical /refractory GERD symptoms like dysphagia and "bad heartburn" and and globus  The only way I know  to prove this is not an "ACEi Case" is a trial off ACEi x a minimum of 6 weeks then regroup if not resolved.  >>> try micardis 40-12.5 one daily and f/u prn sob/cough p 6 weeks   Total time devoted to counseling  > 50 % of initial 60 min office visit:  review case with pt/ discussion of options/alternatives/ personally creating written customized instructions  in presence of pt  then going over those specific  Instructions directly with the pt including how to use all of the meds but in particular covering each new medication in detail and the difference between the maintenance= "automatic" meds and the prns using an action plan format for the latter (If this problem/symptom => do that organization reading Left to right).  Please see AVS from this visit for a full list of these instructions which I personally wrote for this pt and  are unique to this visit.

## 2019-03-14 NOTE — Progress Notes (Signed)
Miranda Fuller, female    DOB: Jul 24, 1950,    MRN: [id number]   Brief patient profile:  60 yowf never smoker with "valley fever" in her 25s living in [place] with doe ever since with baseline wt 185  and moved to [place] early 1990's with no change in health x for trouble with wt control and doe s/p gastric sleave 2017 with 30 lb of wt loss since then but breathing no better with highly variable doe and sense of globus so referred to pulmonary clinic 03/14/2019 by Shona Simpson.   W/u for Breast ca for surgery by Dr [name] with incidental LUL nodule no change on 12/27/18 since 06/15/13 vs CT chet 2018   Was tested for TB and found to be positive at 8 but never treated   History of Present Illness  03/14/2019  Pulmonary/ 1st office eval/Miranda Fuller  Chief Complaint  Patient presents with  . Pulmonary Consult    Referred by Shona Simpson PA for pulmonary nodule. Patient has been having SOB and fatigue for about a year. Patient denies any coughing or wheezing. Patient was dx coccidioidomycosis (valley fever) 35 years ago.  Dyspnea:  X exercise/ steps - does treadmill 3 x weekly x 30 min x slow x flat and can't do hills  MMRC1 = can walk nl pace, flat grade, can't hurry or go uphills or steps s sob   Also variable sob with globus sensation on gerd rx x 3 years  Cough: variable no noct always dry  Sleep: on r side bed is flat 2 pillows > UP3 x 10 y prior to OV  / feels refreshed   SABA use: none   No obvious day to day or daytime variability or assoc excess/ purulent sputum or mucus plugs or hemoptysis or cp or chest tightness, subjective wheeze or overt sinus or hb symptoms.   Sleeping ok as above  without nocturnal  or early am exacerbation  of respiratory  c/o's or need for noct saba. Also denies any obvious fluctuation of symptoms with weather or environmental changes or other aggravating or alleviating factors except as outlined above   No unusual exposure hx or h/o childhood pna/ asthma or  knowledge of premature birth.  Current Allergies, Complete Past Medical History, Past Surgical History, Family History, and Social History were reviewed in [employer] record.  ROS  The following are not active complaints unless bolded Hoarseness, sore throat, dysphagia, dental problems, itching, sneezing,  nasal congestion or discharge of excess mucus or purulent secretions, ear ache,   fever, chills, sweats, unintended wt loss or wt gain, classically pleuritic or exertional cp,  orthopnea pnd or arm/hand swelling  or leg swelling, presyncope, palpitations, abdominal pain, anorexia, nausea, vomiting, diarrhea  or change in bowel habits or change in bladder habits, change in stools or change in urine, dysuria, hematuria,  rash, arthralgias, visual complaints, headache, numbness, weakness or ataxia or problems with walking or coordination,  change in mood or  memory.              Past Medical History:  Diagnosis Date  . Breast mass 12/27/2018  . Coccidioidomycosis, pulmonary ([place])   . DDD (degenerative disc disease), cervical   . Dementia ([place])    Early per Dr. Luan Pulling  . Depression 2009  . DM (diabetes mellitus screen) 2009  . Family history of bone cancer   . Family history of brain cancer   . Family history of breast cancer   .  Family history of esophageal cancer   . Family history of lung cancer   . Glaucoma   . HTN (hypertension)   . Morbid obesity ([place])   . Nodule of upper lobe of left lung 12/27/2018  . Sleep apnea   . Type II diabetes mellitus, uncontrolled ([place]) 01/31/2019    Outpatient Medications Prior to Visit  Medication Sig Dispense Refill  . aspirin 81 MG tablet Take 1 tablet by mouth daily.    . Calcium Carbonate-Vit D-Min (CALCIUM 1200 PO) Take 1,200 mg by mouth 2 (two) times daily.    . Cholecalciferol (VITAMIN D-3) 25 MCG (1000 UT) CAPS Take 3 capsules by mouth daily.    . insulin aspart (NOVOLOG) 100 UNIT/ML injection Inject 5-15 Units  into the skin 3 (three) times daily with meals. 5-15 units on sliding scale     . insulin glargine (LANTUS) 100 UNIT/ML injection Inject 52 Units into the skin at bedtime.     . isosorbide mononitrate (IMDUR) 30 MG 24 hr tablet Take 1 tablet (30 mg total) by mouth daily. 90 tablet 1  . lisinopril-hydrochlorothiazide (PRINZIDE,ZESTORETIC) 10-12.5 MG tablet Take 1 tablet by mouth daily.    . Multiple Vitamins-Minerals (MULTIVITAMIN ADULT PO) Take by mouth daily.    . pantoprazole (PROTONIX) 40 MG tablet Take 1 tablet (40 mg total) by mouth daily. 30 tablet 3  . Pediatric Multivitamins-Iron Vicki Mallet W/IRON PO) Take 2 tablets by mouth daily.    . vitamin B-12 (CYANOCOBALAMIN) 500 MCG tablet Take 1,000 mcg by mouth 2 (two) times daily.        Objective:     BP 128/80 (BP Location: Left Arm, Patient Position: Sitting, Cuff Size: Large)   Pulse 73   Temp 98 F (36.7 C) (Temporal)   Ht 4' 11"  (1.499 m)   Wt 223 lb 9.6 oz (101.4 kg)   SpO2 98% Comment: on RA  BMI 45.16 kg/m   SpO2: 98 %(on RA)   Wt Readings from Last 3 Encounters:  03/14/19 223 lb 9.6 oz (101.4 kg)  02/22/19 228 lb (103.4 kg)  01/31/19 218 lb 12.8 oz (99.2 kg)     amb obese pleasant wf nad  . HEENT : pt wearing mask not removed for exam due to covid -19 concerns.    NECK :  without JVD/Nodes/TM/ nl carotid upstrokes bilaterally   LUNGS: no acc muscle use,  Nl contour chest which is clear to A and P bilaterally without cough on insp or exp maneuvers   CV:  RRR  no s3 or murmur or increase in P2, and no edema   ABD: obese/  soft and nontender with nl inspiratory excursion in the supine position. No bruits or organomegaly appreciated, bowel sounds nl  MS:  Nl gait/ ext warm without deformities, calf tenderness, cyanosis or clubbing No obvious joint restrictions   SKIN: warm and dry without lesions    NEURO:  alert, approp, nl sensorium with  no motor or cerebellar deficits apparent.     I personally  reviewed images and agree with radiology impression as follows:  CXR:  12/27/2018  7 mm left upper lobe pulmonary nodule appears similar to prior chest x-ray from 06/15/2013, and was evident on prior chest CT from 2018, presumably benign   I personally reviewed images and agree with radiology impression as follows:   Chest CT 08/20/2001 AGAIN NOTED IS A 1 CM NODULE WITHIN THE LATERAL LEFT UPPER LOBE.  ALTHOUGH THIS HAS AN ECCENTRIC CALCIFICATION ALONG ITS INFERIOR  ASPECT, THIS NODULE IS UNCHANGED FROM 06/09/96  Labs ordered 03/14/2019  :  Percival Spanish TB     Assessment   pulmonary nodule H/o Pos skin test for TB age 79 - [place] fever in her 45s rx medically, no surgery - CT 08/2001 LUL 1 cm nodule >  06/10/16 CT = 27m  And cxr 12/27/18 7 mm - Quant TB Gold 03/14/2019   Although she has risk factors for Cocci, TB and met breast Ca, this siingle "macroscopic" nodule is clearly benign and only issue is whether surgical/oncologic should include another full chest ct but I don't see the indication for it nor for further f/u or w/u for this specific nodule.  Discussed in detail all the  indications, usual  risks and alternatives  relative to the benefits with patient who agrees to proceed with f/u as indicated by surgery/ oncology as part of a screening for met ca in future.     Essential hypertension Stop lisinopril 03/14/2019 due to globus   In the best review of chronic cough to date ( NEJM 2016 375 [id number] ,  ACEi are now felt to cause cough in up to  20% of pts which is a 4 fold increase from previous reports and does not include the variety of non-specific complaints we see in pulmonary clinic in pts on ACEi but previously attributed to another dx like  Copd/asthma and  include PNDS, throat  congestion, "bronchitis", unexplained dyspnea and noct "strangling" sensations, and hoarseness, but also  atypical /refractory GERD symptoms like dysphagia and "bad heartburn" and and globus  The  only way I know  to prove this is not an "ACEi Case" is a trial off ACEi x a minimum of 6 weeks then regroup if not resolved.  >>> try micardis 40-12.5 one daily and f/u prn sob/cough p 6 weeks   DOE (dyspnea on exertion) Onset p valley fever age 68 sworse since 2017  - acei d/c 03/14/2019  - 03/14/2019   Walked RA x two laps =  approx 5037f@ nl/fast pace - stopped due to end of study with sats of 97% at the end of the study and no significant sob   >>> Most likely this is conditioning plus acei related > f/u off acei if not improving      Total time devoted to counseling  > 50 % of initial 60 min office visit:  review case with pt/ discussion of options/alternatives/ personally creating written customized instructions  in presence of pt  then going over those specific  Instructions directly with the pt including how to use all of the meds but in particular covering each new medication in detail and the difference between the maintenance= "automatic" meds and the prns using an action plan format for the latter (If this problem/symptom => do that organization reading Left to right).  Please see AVS from this visit for a full list of these instructions which I personally wrote for this pt and  are unique to this visit.   MiChristinia GullyMD 03/14/2019

## 2019-03-14 NOTE — Patient Instructions (Addendum)
Stop the zestoretic now and replace it micardis  40-12.5 one daily and the variable breathing problem should improve.  To get the most out of exercise, you need to be continuously aware that you are short of breath, but never out of breath, for 30 minutes daily. As you improve, it will actually be easier for you to do the same amount of exercise  in  30 minutes so always push to the level where you are short of breath. If not improving after 4 weeks please call.    Call me if your surgeon / oncologist want to biopsy any lung nodules when they arrange for you follow up

## 2019-03-14 NOTE — Assessment & Plan Note (Signed)
H/o Pos skin test for TB age 68 - [place] fever in her 70s rx medically, no surgery - CT 08/2001 LUL 1 cm nodule >  06/10/16 CT = 77m  And cxr 12/27/18 7 mm - Quant TB Gold 03/14/2019   Although she has risk factors for Cocci, TB and met breast Ca, this siingle "macroscopic" nodule is clearly benign and only issue is whether surgical/oncologic should include another full chest ct but I don't see the indication for it nor for further f/u or w/u for this specific nodule.  Discussed in detail all the  indications, usual  risks and alternatives  relative to the benefits with patient who agrees to proceed with f/u as indicated by surgery/ oncology as part of a screening for met ca in future.

## 2019-03-15 ENCOUNTER — Encounter: Payer: Self-pay | Admitting: *Deleted

## 2019-03-16 LAB — QUANTIFERON-TB GOLD PLUS
Mitogen-NIL: >10 IU/mL
NIL: 0.07 IU/mL
QuantiFERON-TB Gold Plus: NEGATIVE
TB1-NIL: 0.03 IU/mL
TB2-NIL: 0.02 IU/mL

## 2019-03-16 NOTE — Progress Notes (Signed)
Spoke with pt and notified of results per Dr. Wert. Pt verbalized understanding and denied any questions. 

## 2019-04-18 ENCOUNTER — Telehealth: Payer: Self-pay | Admitting: Hematology and Oncology

## 2019-04-18 NOTE — Telephone Encounter (Signed)
Returned patient's phone call regarding cancelling 02/22 appointment, spoke with patient's daughter and appointment has been cancelled.

## 2019-04-19 NOTE — Telephone Encounter (Signed)
error 

## 2019-04-22 ENCOUNTER — Encounter (HOSPITAL_BASED_OUTPATIENT_CLINIC_OR_DEPARTMENT_OTHER): Payer: Self-pay | Admitting: General Surgery

## 2019-04-22 ENCOUNTER — Other Ambulatory Visit: Payer: Self-pay

## 2019-04-25 ENCOUNTER — Other Ambulatory Visit (HOSPITAL_COMMUNITY)
Admission: RE | Admit: 2019-04-25 | Discharge: 2019-04-25 | Disposition: A | Discharge status: Home / Self Care | Payer: Medicare Other | Source: Ambulatory Visit | Attending: General Surgery | Admitting: General Surgery

## 2019-04-25 ENCOUNTER — Other Ambulatory Visit: Payer: Self-pay

## 2019-04-25 ENCOUNTER — Encounter (HOSPITAL_BASED_OUTPATIENT_CLINIC_OR_DEPARTMENT_OTHER)
Admission: RE | Admit: 2019-04-25 | Discharge: 2019-04-25 | Disposition: A | Discharge status: Home / Self Care | Payer: Medicare Other | Source: Ambulatory Visit | Attending: General Surgery | Admitting: General Surgery

## 2019-04-25 DIAGNOSIS — Z7984 Long term (current) use of oral hypoglycemic drugs: Secondary | ICD-10-CM | POA: Diagnosis not present

## 2019-04-25 DIAGNOSIS — Z803 Family history of malignant neoplasm of breast: Secondary | ICD-10-CM | POA: Diagnosis not present

## 2019-04-25 DIAGNOSIS — I10 Essential (primary) hypertension: Secondary | ICD-10-CM | POA: Diagnosis not present

## 2019-04-25 DIAGNOSIS — F329 Major depressive disorder, single episode, unspecified: Secondary | ICD-10-CM | POA: Diagnosis not present

## 2019-04-25 DIAGNOSIS — Z20822 Contact with and (suspected) exposure to covid-19: Secondary | ICD-10-CM | POA: Insufficient documentation

## 2019-04-25 DIAGNOSIS — Z79899 Other long term (current) drug therapy: Secondary | ICD-10-CM | POA: Diagnosis not present

## 2019-04-25 DIAGNOSIS — Z01812 Encounter for preprocedural laboratory examination: Secondary | ICD-10-CM | POA: Insufficient documentation

## 2019-04-25 DIAGNOSIS — F419 Anxiety disorder, unspecified: Secondary | ICD-10-CM | POA: Diagnosis not present

## 2019-04-25 DIAGNOSIS — E119 Type 2 diabetes mellitus without complications: Secondary | ICD-10-CM | POA: Diagnosis not present

## 2019-04-25 DIAGNOSIS — Z7982 Long term (current) use of aspirin: Secondary | ICD-10-CM | POA: Diagnosis not present

## 2019-04-25 DIAGNOSIS — D0511 Intraductal carcinoma in situ of right breast: Secondary | ICD-10-CM | POA: Diagnosis not present

## 2019-04-25 DIAGNOSIS — K219 Gastro-esophageal reflux disease without esophagitis: Secondary | ICD-10-CM | POA: Diagnosis not present

## 2019-04-25 DIAGNOSIS — Z6841 Body Mass Index (BMI) 40.0 and over, adult: Secondary | ICD-10-CM | POA: Diagnosis not present

## 2019-04-25 DIAGNOSIS — Z170 Estrogen receptor positive status [ER+]: Secondary | ICD-10-CM | POA: Diagnosis not present

## 2019-04-25 DIAGNOSIS — Z01818 Encounter for other preprocedural examination: Secondary | ICD-10-CM | POA: Insufficient documentation

## 2019-04-25 LAB — BASIC METABOLIC PANEL
Anion gap: 9 (ref 5–15)
BUN: 22 mg/dL (ref 8–23)
CO2: 23 mmol/L (ref 22–32)
Calcium: 9 mg/dL (ref 8.9–10.3)
Chloride: 105 mmol/L (ref 98–111)
Creatinine, Ser: 0.76 mg/dL (ref 0.44–1.00)
GFR calc Af Amer: >60 mL/min (ref >60)
GFR calc non Af Amer: >60 mL/min (ref >60)
Glucose, Bld: 148 mg/dL — ABNORMAL HIGH (ref 70–99)
Potassium: 4.3 mmol/L (ref 3.5–5.1)
Sodium: 137 mmol/L (ref 135–145)

## 2019-04-25 LAB — SARS CORONAVIRUS 2 (TAT 6-24 HRS): SARS Coronavirus 2: NEGATIVE

## 2019-04-25 NOTE — Progress Notes (Signed)

## 2019-04-25 NOTE — Progress Notes (Signed)
Anesthesia consult per Dr. Christella Hartigan, will proceed with surgery as scheduled.

## 2019-04-27 ENCOUNTER — Ambulatory Visit
Admission: RE | Admit: 2019-04-27 | Discharge: 2019-04-27 | Disposition: A | Discharge status: Home / Self Care | Payer: Medicare Other | Source: Ambulatory Visit | Attending: General Surgery | Admitting: General Surgery

## 2019-04-27 ENCOUNTER — Other Ambulatory Visit: Payer: Self-pay

## 2019-04-27 DIAGNOSIS — D0511 Intraductal carcinoma in situ of right breast: Secondary | ICD-10-CM

## 2019-04-28 ENCOUNTER — Other Ambulatory Visit: Payer: Self-pay

## 2019-04-28 ENCOUNTER — Encounter (HOSPITAL_BASED_OUTPATIENT_CLINIC_OR_DEPARTMENT_OTHER): Payer: Self-pay | Admitting: General Surgery

## 2019-04-28 ENCOUNTER — Ambulatory Visit (HOSPITAL_BASED_OUTPATIENT_CLINIC_OR_DEPARTMENT_OTHER): Admit: 2019-04-28 | Payer: Medicare Other | Admitting: General Surgery

## 2019-04-28 ENCOUNTER — Ambulatory Visit
Admission: RE | Admit: 2019-04-28 | Discharge: 2019-04-28 | Disposition: A | Discharge status: Home / Self Care | Payer: Medicare Other | Source: Ambulatory Visit | Attending: General Surgery | Admitting: General Surgery

## 2019-04-28 ENCOUNTER — Encounter (HOSPITAL_BASED_OUTPATIENT_CLINIC_OR_DEPARTMENT_OTHER)
Admission: RE | Disposition: A | Discharge status: Home / Self Care | Payer: Self-pay | Source: Home / Self Care | Attending: General Surgery

## 2019-04-28 ENCOUNTER — Ambulatory Visit (HOSPITAL_BASED_OUTPATIENT_CLINIC_OR_DEPARTMENT_OTHER)
Admission: RE | Admit: 2019-04-28 | Discharge: 2019-04-28 | Disposition: A | Discharge status: Home / Self Care | Payer: Medicare Other | Attending: General Surgery | Admitting: General Surgery

## 2019-04-28 ENCOUNTER — Encounter (HOSPITAL_BASED_OUTPATIENT_CLINIC_OR_DEPARTMENT_OTHER): Payer: Self-pay

## 2019-04-28 ENCOUNTER — Ambulatory Visit (HOSPITAL_BASED_OUTPATIENT_CLINIC_OR_DEPARTMENT_OTHER): Payer: Medicare Other | Admitting: Anesthesiology

## 2019-04-28 DIAGNOSIS — F419 Anxiety disorder, unspecified: Secondary | ICD-10-CM | POA: Insufficient documentation

## 2019-04-28 DIAGNOSIS — Z7982 Long term (current) use of aspirin: Secondary | ICD-10-CM | POA: Insufficient documentation

## 2019-04-28 DIAGNOSIS — K219 Gastro-esophageal reflux disease without esophagitis: Secondary | ICD-10-CM | POA: Insufficient documentation

## 2019-04-28 DIAGNOSIS — D0511 Intraductal carcinoma in situ of right breast: Secondary | ICD-10-CM | POA: Insufficient documentation

## 2019-04-28 DIAGNOSIS — I10 Essential (primary) hypertension: Secondary | ICD-10-CM | POA: Insufficient documentation

## 2019-04-28 DIAGNOSIS — E119 Type 2 diabetes mellitus without complications: Secondary | ICD-10-CM | POA: Insufficient documentation

## 2019-04-28 DIAGNOSIS — Z7984 Long term (current) use of oral hypoglycemic drugs: Secondary | ICD-10-CM | POA: Insufficient documentation

## 2019-04-28 DIAGNOSIS — Z170 Estrogen receptor positive status [ER+]: Secondary | ICD-10-CM | POA: Diagnosis not present

## 2019-04-28 DIAGNOSIS — F329 Major depressive disorder, single episode, unspecified: Secondary | ICD-10-CM | POA: Insufficient documentation

## 2019-04-28 DIAGNOSIS — Z6841 Body Mass Index (BMI) 40.0 and over, adult: Secondary | ICD-10-CM | POA: Insufficient documentation

## 2019-04-28 DIAGNOSIS — Z79899 Other long term (current) drug therapy: Secondary | ICD-10-CM | POA: Insufficient documentation

## 2019-04-28 DIAGNOSIS — Z803 Family history of malignant neoplasm of breast: Secondary | ICD-10-CM | POA: Insufficient documentation

## 2019-04-28 HISTORY — DX: Other specified postprocedural states: R11.2

## 2019-04-28 HISTORY — DX: Other complications of anesthesia, initial encounter: T88.59XA

## 2019-04-28 HISTORY — PX: BREAST LUMPECTOMY: SHX2

## 2019-04-28 HISTORY — PX: BREAST LUMPECTOMY WITH RADIOACTIVE SEED LOCALIZATION: SHX6424

## 2019-04-28 HISTORY — DX: Nausea with vomiting, unspecified: R11.2

## 2019-04-28 HISTORY — DX: Other specified postprocedural states: Z98.890

## 2019-04-28 LAB — GLUCOSE, CAPILLARY
Glucose-Capillary: 192 mg/dL — ABNORMAL HIGH (ref 70–99)
Glucose-Capillary: 163 mg/dL — ABNORMAL HIGH (ref 70–99)

## 2019-04-28 SURGERY — BREAST LUMPECTOMY WITH RADIOACTIVE SEED LOCALIZATION
Anesthesia: General | Site: Breast | Laterality: Right

## 2019-04-28 MED ORDER — DEXAMETHASONE SODIUM PHOSPHATE 10 MG/ML IJ SOLN
INTRAMUSCULAR | Status: AC
  Filled 2019-04-28: qty 2

## 2019-04-28 MED ORDER — TRAMADOL HCL 50 MG PO TABS
ORAL_TABLET | ORAL | Status: AC
  Filled 2019-04-28: qty 1

## 2019-04-28 MED ORDER — LIDOCAINE 2% (20 MG/ML) 5 ML SYRINGE
INTRAMUSCULAR | Status: AC
  Filled 2019-04-28: qty 5

## 2019-04-28 MED ORDER — PHENYLEPHRINE 40 MCG/ML (10ML) SYRINGE FOR IV PUSH (FOR BLOOD PRESSURE SUPPORT)
PREFILLED_SYRINGE | INTRAVENOUS | Status: AC
  Filled 2019-04-28: qty 10

## 2019-04-28 MED ORDER — LACTATED RINGERS IV SOLN: INTRAVENOUS | Status: DC

## 2019-04-28 MED ORDER — LIDOCAINE 2% (20 MG/ML) 5 ML SYRINGE
INTRAMUSCULAR | Status: DC | PRN
  Administered 2019-04-28: 100 mg via INTRAVENOUS

## 2019-04-28 MED ORDER — PROMETHAZINE HCL 25 MG/ML IJ SOLN: 6.2500 mg | INTRAMUSCULAR | Status: DC | PRN

## 2019-04-28 MED ORDER — CHLORHEXIDINE GLUCONATE CLOTH 2 % EX PADS: 6.0000 | MEDICATED_PAD | Freq: Once | CUTANEOUS | Status: DC

## 2019-04-28 MED ORDER — GABAPENTIN 300 MG PO CAPS
ORAL_CAPSULE | ORAL | Status: AC
  Filled 2019-04-28: qty 1

## 2019-04-28 MED ORDER — DEXAMETHASONE SODIUM PHOSPHATE 10 MG/ML IJ SOLN
INTRAMUSCULAR | Status: DC | PRN
  Administered 2019-04-28: 10 mg via INTRAVENOUS

## 2019-04-28 MED ORDER — CEFAZOLIN SODIUM-DEXTROSE 2-4 GM/100ML-% IV SOLN
2.0000 g | INTRAVENOUS | Status: AC
  Administered 2019-04-28: 2 g via INTRAVENOUS

## 2019-04-28 MED ORDER — FENTANYL CITRATE (PF) 100 MCG/2ML IJ SOLN
50.0000 ug | INTRAMUSCULAR | Status: DC | PRN
  Administered 2019-04-28 (×2): 50 ug via INTRAVENOUS

## 2019-04-28 MED ORDER — DIPHENHYDRAMINE HCL 50 MG/ML IJ SOLN
INTRAMUSCULAR | Status: AC
  Filled 2019-04-28: qty 1

## 2019-04-28 MED ORDER — GABAPENTIN 300 MG PO CAPS
300.0000 mg | ORAL_CAPSULE | ORAL | Status: AC
  Administered 2019-04-28: 10:00:00 300 mg via ORAL

## 2019-04-28 MED ORDER — FENTANYL CITRATE (PF) 100 MCG/2ML IJ SOLN: 25.0000 ug | INTRAMUSCULAR | Status: DC | PRN

## 2019-04-28 MED ORDER — ONDANSETRON HCL 4 MG/2ML IJ SOLN
INTRAMUSCULAR | Status: AC
  Filled 2019-04-28: qty 4

## 2019-04-28 MED ORDER — PROPOFOL 10 MG/ML IV BOLUS
INTRAVENOUS | Status: DC | PRN
  Administered 2019-04-28: 200 mg via INTRAVENOUS
  Administered 2019-04-28: 25 ug/kg/min via INTRAVENOUS

## 2019-04-28 MED ORDER — TRAMADOL HCL 50 MG PO TABS: 50.0000 mg | ORAL_TABLET | Freq: Four times a day (QID) | ORAL | 0 refills | Status: DC | PRN

## 2019-04-28 MED ORDER — DIPHENHYDRAMINE HCL 50 MG/ML IJ SOLN
INTRAMUSCULAR | Status: DC | PRN
  Administered 2019-04-28: 12.5 mg via INTRAVENOUS

## 2019-04-28 MED ORDER — TRAMADOL HCL 50 MG PO TABS
50.0000 mg | ORAL_TABLET | Freq: Once | ORAL | Status: AC
  Administered 2019-04-28: 50 mg via ORAL

## 2019-04-28 MED ORDER — PHENYLEPHRINE 40 MCG/ML (10ML) SYRINGE FOR IV PUSH (FOR BLOOD PRESSURE SUPPORT)
PREFILLED_SYRINGE | INTRAVENOUS | Status: DC | PRN
  Administered 2019-04-28 (×2): 80 ug via INTRAVENOUS
  Administered 2019-04-28: 40 ug via INTRAVENOUS
  Administered 2019-04-28 (×2): 80 ug via INTRAVENOUS

## 2019-04-28 MED ORDER — ACETAMINOPHEN 10 MG/ML IV SOLN: 1000.0000 mg | Freq: Once | INTRAVENOUS | Status: DC | PRN

## 2019-04-28 MED ORDER — CEFAZOLIN SODIUM-DEXTROSE 2-4 GM/100ML-% IV SOLN
INTRAVENOUS | Status: AC
  Filled 2019-04-28: qty 100

## 2019-04-28 MED ORDER — FENTANYL CITRATE (PF) 100 MCG/2ML IJ SOLN
INTRAMUSCULAR | Status: AC
  Filled 2019-04-28: qty 2

## 2019-04-28 MED ORDER — PROPOFOL 500 MG/50ML IV EMUL
INTRAVENOUS | Status: AC
  Filled 2019-04-28: qty 100

## 2019-04-28 MED ORDER — BUPIVACAINE HCL (PF) 0.25 % IJ SOLN
INTRAMUSCULAR | Status: DC | PRN
  Administered 2019-04-28: 26 mL

## 2019-04-28 MED ORDER — ONDANSETRON HCL 4 MG/2ML IJ SOLN
INTRAMUSCULAR | Status: DC | PRN
  Administered 2019-04-28 (×2): 4 mg via INTRAVENOUS

## 2019-04-28 MED ORDER — MIDAZOLAM HCL 2 MG/2ML IJ SOLN: 1.0000 mg | INTRAMUSCULAR | Status: DC | PRN

## 2019-04-28 SURGICAL SUPPLY — 44 items
ADH SKN CLS APL DERMABOND .7 (GAUZE/BANDAGES/DRESSINGS) ×1
APL PRP STRL LF DISP 70% ISPRP (MISCELLANEOUS) ×1
APPLIER CLIP 9.375 MED OPEN (MISCELLANEOUS)
APR CLP MED 9.3 20 MLT OPN (MISCELLANEOUS)
BLADE SURG 15 STRL LF DISP TIS (BLADE) ×1 IMPLANT
BLADE SURG 15 STRL SS (BLADE) ×3
CANISTER SUC SOCK COL 7IN (MISCELLANEOUS) ×3 IMPLANT
CANISTER SUCT 1200ML W/VALVE (MISCELLANEOUS) ×3 IMPLANT
CHLORAPREP W/TINT 26 (MISCELLANEOUS) ×3 IMPLANT
CLIP APPLIE 9.375 MED OPEN (MISCELLANEOUS) IMPLANT
COVER BACK TABLE 60X90IN (DRAPES) ×3 IMPLANT
COVER MAYO STAND STRL (DRAPES) ×3 IMPLANT
COVER PROBE W GEL 5X96 (DRAPES) ×3 IMPLANT
COVER WAND RF STERILE (DRAPES) IMPLANT
DECANTER SPIKE VIAL GLASS SM (MISCELLANEOUS) IMPLANT
DERMABOND ADVANCED (GAUZE/BANDAGES/DRESSINGS) ×2
DERMABOND ADVANCED .7 DNX12 (GAUZE/BANDAGES/DRESSINGS) ×1 IMPLANT
DRAPE LAPAROSCOPIC ABDOMINAL (DRAPES) ×3 IMPLANT
DRAPE UTILITY XL STRL (DRAPES) ×3 IMPLANT
ELECT COATED BLADE 2.86 ST (ELECTRODE) ×3 IMPLANT
ELECT REM PT RETURN 9FT ADLT (ELECTROSURGICAL) ×3
ELECTRODE REM PT RTRN 9FT ADLT (ELECTROSURGICAL) ×1 IMPLANT
GLOVE BIO SURGEON STRL SZ7.5 (GLOVE) ×6 IMPLANT
GOWN STRL REUS W/ TWL LRG LVL3 (GOWN DISPOSABLE) ×2 IMPLANT
GOWN STRL REUS W/TWL LRG LVL3 (GOWN DISPOSABLE) ×6
ILLUMINATOR WAVEGUIDE N/F (MISCELLANEOUS) IMPLANT
KIT MARKER MARGIN INK (KITS) ×3 IMPLANT
LIGHT WAVEGUIDE WIDE FLAT (MISCELLANEOUS) IMPLANT
NDL HYPO 25X1 1.5 SAFETY (NEEDLE) IMPLANT
NEEDLE HYPO 25X1 1.5 SAFETY (NEEDLE) IMPLANT
NS IRRIG 1000ML POUR BTL (IV SOLUTION) IMPLANT
PACK BASIN DAY SURGERY FS (CUSTOM PROCEDURE TRAY) ×3 IMPLANT
PENCIL SMOKE EVACUATOR (MISCELLANEOUS) ×3 IMPLANT
SLEEVE SCD COMPRESS KNEE MED (MISCELLANEOUS) ×3 IMPLANT
SPONGE LAP 18X18 RF (DISPOSABLE) ×3 IMPLANT
SUT MON AB 4-0 PC3 18 (SUTURE) ×3 IMPLANT
SUT SILK 2 0 SH (SUTURE) IMPLANT
SUT VICRYL 3-0 CR8 SH (SUTURE) ×3 IMPLANT
SYR CONTROL 10ML LL (SYRINGE) IMPLANT
TOWEL GREEN STERILE FF (TOWEL DISPOSABLE) ×3 IMPLANT
TRAY FAXITRON CT DISP (TRAY / TRAY PROCEDURE) ×3 IMPLANT
TUBE CONNECTING 20'X1/4 (TUBING) ×1
TUBE CONNECTING 20X1/4 (TUBING) ×2 IMPLANT
YANKAUER SUCT BULB TIP NO VENT (SUCTIONS) IMPLANT

## 2019-04-28 NOTE — H&P (Signed)
Miranda Fuller  Location: [facility] Patient #: [id number] DOB: 04-19-1950 Married / Language: English / Race: White Female   History of Present Illness  The patient is a 69 year old female who presents for a follow-up for Breast cancer. We are asked to see the patient in consultation by Dr. Anastasio Champion to evaluate her for a new right breast cancer. The patient is a 69 year old white female who initially presented with bloody nipple discharge from both breasts for the last couple years. A recent mammogram did show a new 3.8 mm area of calcification in the subareolar right breast inferiorly. This was biopsied and came back as ductal carcinoma in situ that was ER/PR positive. She does report pain in both breasts. The discharge is not spontaneous and only occurs with decompression of the breast. She does have a strong family history of breast cancer in her mother, maternal grandmother, and maternal great aunt. She does not smoke.   Past Surgical History  Breast Biopsy  Right. Breast Mass; Local Excision  Right. Colon Polyp Removal - Colonoscopy  Gallbladder Surgery - Open  Hysterectomy (due to cancer) - Complete  Sleeve Gastrectomy   Diagnostic Studies History Colonoscopy  1-5 years ago Mammogram  within last year Pap Smear  1-5 years ago  Allergies Codeine/Codeine Derivatives  Morphine Sulfate (PF) *ANALGESICS - OPIOID*  Percocet *ANALGESICS - OPIOID*  Allergies Reconciled   Social History  Alcohol use  Occasional alcohol use. Caffeine use  Carbonated beverages, Tea. No drug use  Tobacco use  Never smoker.  Family History Breast Cancer  Family Members In General, Mother. Colon Cancer  Mother. Diabetes Mellitus  Mother. Heart Disease  Mother. Heart disease in female family member before age 37  Hypertension  Brother, Father. Kidney Disease  Brother, Sister. Respiratory Condition  Family Members In General. Seizure disorder   Brother.  Pregnancy / Birth History  Age at menarche  68 years. Age of menopause  <47 Contraceptive History  Oral contraceptives. Gravida  8 Maternal age  27-20 Para  3  Other Problems  Anxiety Disorder  Arthritis  Back Pain  Breast Cancer  Chest pain  Depression  Diabetes Mellitus  Gastric Ulcer  Gastroesophageal Reflux Disease  Hemorrhoids  High blood pressure  Kidney Stone  Lump In Breast  Migraine Headache  Oophorectomy     Review of Systems  General Present- Fatigue. Not Present- Appetite Loss, Chills, Fever, Night Sweats, Weight Gain and Weight Loss. HEENT Present- Wears glasses/contact lenses. Not Present- Earache, Hearing Loss, Hoarseness, Nose Bleed, Oral Ulcers, Ringing in the Ears, Seasonal Allergies, Sinus Pain, Sore Throat, Visual Disturbances and Yellow Eyes. Respiratory Not Present- Bloody sputum, Chronic Cough, Difficulty Breathing, Snoring and Wheezing. Breast Present- Breast Mass, Breast Pain and Nipple Discharge. Not Present- Skin Changes. Cardiovascular Present- Swelling of Extremities. Not Present- Chest Pain, Difficulty Breathing Lying Down, Leg Cramps, Palpitations, Rapid Heart Rate and Shortness of Breath. Gastrointestinal Present- Abdominal Pain and Bloating. Not Present- Bloody Stool, Change in Bowel Habits, Chronic diarrhea, Constipation, Difficulty Swallowing, Excessive gas, Gets full quickly at meals, Hemorrhoids, Indigestion, Nausea, Rectal Pain and Vomiting. Female Genitourinary Present- Pelvic Pain. Not Present- Frequency, Nocturia, Painful Urination and Urgency. Musculoskeletal Present- Back Pain, Joint Pain, Muscle Pain and Swelling of Extremities. Not Present- Joint Stiffness and Muscle Weakness. Neurological Present- Decreased Memory and Headaches. Not Present- Fainting, Numbness, Seizures, Tingling, Tremor, Trouble walking and Weakness. Psychiatric Present- Anxiety. Not Present- Bipolar, Change in Sleep Pattern,  Depression, Fearful and Frequent crying. Endocrine Present-  Cold Intolerance and New Diabetes. Not Present- Excessive Hunger, Hair Changes, Heat Intolerance and Hot flashes. Hematology Not Present- Blood Thinners, Easy Bruising, Excessive bleeding, Gland problems, HIV and Persistent Infections.  Vitals  Weight: 225.4 lb Height: 59in Body Surface Area: 1.94 m Body Mass Index: 45.52 kg/m  Temp.: 97.42F(Tympanic)  Pulse: 91 (Regular)  BP: 128/72 (Sitting, Left Arm, Standard)       Physical Exam  The physical exam findings are as follows: Note:There is some mild right upper quadrant tenderness. There is no palpable mass. She does have a history of gastric sleeve and cholecystectomy  General Mental Status-Alert. General Appearance-Consistent with stated age. Hydration-Well hydrated. Voice-Normal.  Head and Neck Head-normocephalic, atraumatic with no lesions or palpable masses. Trachea-midline. Thyroid Gland Characteristics - normal size and consistency.  Eye Eyeball - Bilateral-Extraocular movements intact. Sclera/Conjunctiva - Bilateral-No scleral icterus.  Chest and Lung Exam Chest and lung exam reveals -quiet, even and easy respiratory effort with no use of accessory muscles and on auscultation, normal breath sounds, no adventitious sounds and normal vocal resonance. Inspection Chest Wall - Normal. Back - normal.  Breast Note: There is no palpable mass in either breast. There is no palpable axillary, supraclavicular, or cervical lymphadenopathy. She does have mild to moderate diffuse tenderness bilaterally   Cardiovascular Cardiovascular examination reveals -normal heart sounds, regular rate and rhythm with no murmurs and normal pedal pulses bilaterally.  Abdomen Inspection Inspection of the abdomen reveals - No Hernias. Skin - Scar - no surgical scars. Palpation/Percussion Palpation and Percussion of the abdomen reveal - Soft, Non  Tender, No Rebound tenderness, No Rigidity (guarding) and No hepatosplenomegaly. Auscultation Auscultation of the abdomen reveals - Bowel sounds normal.  Neurologic Neurologic evaluation reveals -alert and oriented x 3 with no impairment of recent or remote memory. Mental Status-Normal.  Musculoskeletal Normal Exam - Left-Upper Extremity Strength Normal and Lower Extremity Strength Normal. Normal Exam - Right-Upper Extremity Strength Normal and Lower Extremity Strength Normal.  Lymphatic Head & Neck  General Head & Neck Lymphatics: Bilateral - Description - Normal. Axillary  General Axillary Region: Bilateral - Description - Normal. Tenderness - Non Tender. Femoral & Inguinal  Generalized Femoral & Inguinal Lymphatics: Bilateral - Description - Normal. Tenderness - Non Tender.    Assessment & Plan  DUCTAL CARCINOMA IN SITU (DCIS) OF RIGHT BREAST (D05.11) Impression: The patient appears to have a 3.8 mm area of DCIS in the subareolar right breast inferiorly. I have talked to her in detail about the different options for treatment. Given her strong family history she is in favor of genetic testing prior to making any final decisions. I believe if she is genetically negative she would favor breast conservation. She would not need a node evaluation. I will go ahead and make a referral to medical oncology as well as to genetics. I think she could be started on an antiestrogen for peace of mind and chemoprevention while she is waiting for definitive surgery. She also has some significant right upper quadrant pain and a history of cholecystectomy so we will evaluate this with a CT scan of the abdomen and pelvis. I will call her with the results of all her testing and then we can proceed accordingly. Current Plans Referred to Genetic Counseling, for evaluation and follow up (Medical Genetics). Routine. Referred to Oncology, for evaluation and follow up (Oncology). Routine. CT ABDOMEN  PELVIS W CON ([id number])

## 2019-04-28 NOTE — Interval H&P Note (Signed)
History and Physical Interval Note:  04/28/2019 10:46 AM  Miranda Fuller  has presented today for surgery, with the diagnosis of RIGHT BREAST DUCTAL CARCINOMA IN SITU.  The various methods of treatment have been discussed with the patient and family. After consideration of risks, benefits and other options for treatment, the patient has consented to  Procedure(s): RIGHT BREAST LUMPECTOMY WITH RADIOACTIVE SEED LOCALIZATION (Right) as a surgical intervention.  The patient's history has been reviewed, patient examined, no change in status, stable for surgery.  I have reviewed the patient's chart and labs.  Questions were answered to the patient's satisfaction.     [name]

## 2019-04-28 NOTE — Discharge Instructions (Signed)

## 2019-04-28 NOTE — Anesthesia Procedure Notes (Signed)
Procedure Name: LMA Insertion Date/Time: 04/28/2019 11:25 AM Performed by: Gwyndolyn Saxon, CRNA Pre-anesthesia Checklist: Patient identified, Emergency Drugs available, Suction available and Patient being monitored Patient Re-evaluated:Patient Re-evaluated prior to induction Oxygen Delivery Method: Circle system utilized Preoxygenation: Pre-oxygenation with 100% oxygen Induction Type: IV induction Ventilation: Mask ventilation without difficulty LMA: LMA inserted LMA Size: 4.0 Number of attempts: 1 Placement Confirmation: positive ETCO2 and breath sounds checked- equal and bilateral Tube secured with: Tape Dental Injury: Teeth and Oropharynx as per pre-operative assessment  Comments: Dr Kalman Shan gently placed LMA; no disruption of palate noted; no change in dentition

## 2019-04-28 NOTE — Anesthesia Preprocedure Evaluation (Signed)
Anesthesia Evaluation  Patient identified by MRN, date of birth, ID band Patient awake    Reviewed: Allergy & Precautions, NPO status , Patient's Chart, lab work & pertinent test results  History of Anesthesia Complications (+) PONV  Airway Mallampati: II  TM Distance: >3 FB Neck ROM: Full    Dental no notable dental hx.    Pulmonary neg pulmonary ROS,    Pulmonary exam normal breath sounds clear to auscultation       Cardiovascular hypertension, + DOE  Normal cardiovascular exam Rhythm:Regular Rate:Normal     Neuro/Psych Dementia negative neurological ROS     GI/Hepatic negative GI ROS, Neg liver ROS,   Endo/Other  diabetesMorbid obesity  Renal/GU negative Renal ROS  negative genitourinary   Musculoskeletal negative musculoskeletal ROS (+)   Abdominal   Peds negative pediatric ROS (+)  Hematology negative hematology ROS (+)   Anesthesia Other Findings   Reproductive/Obstetrics negative OB ROS                             Anesthesia Physical Anesthesia Plan  ASA: III  Anesthesia Plan: General   Post-op Pain Management:    Induction: Intravenous  PONV Risk Score and Plan: 4 or greater and Ondansetron, Dexamethasone and Treatment may vary due to age or medical condition  Airway Management Planned: LMA  Additional Equipment:   Intra-op Plan:   Post-operative Plan: Extubation in OR  Informed Consent: I have reviewed the patients History and Physical, chart, labs and discussed the procedure including the risks, benefits and alternatives for the proposed anesthesia with the patient or authorized representative who has indicated his/her understanding and acceptance.     Dental advisory given  Plan Discussed with: CRNA and Surgeon  Anesthesia Plan Comments:         Anesthesia Quick Evaluation

## 2019-04-28 NOTE — Transfer of Care (Signed)
Immediate Anesthesia Transfer of Care Note  Patient: Miranda Fuller  Procedure(s) Performed: RIGHT BREAST LUMPECTOMY WITH RADIOACTIVE SEED LOCALIZATION (Right Breast)  Patient Location: PACU  Anesthesia Type:General  Level of Consciousness: drowsy  Airway & Oxygen Therapy: Patient Spontanous Breathing and Patient connected to face mask oxygen  Post-op Assessment: Report given to RN and Post -op Vital signs reviewed and stable  Post vital signs: Reviewed and stable  Last Vitals:  Vitals Value Taken Time  BP 151/79 04/28/19 1230  Temp    Pulse 85 04/28/19 1233  Resp 29 04/28/19 1233  SpO2 99 % 04/28/19 1233  Vitals shown include unvalidated device data.  Last Pain:  Vitals:   04/28/19 1007  TempSrc: Oral  PainSc: 0-No pain      Patients Stated Pain Goal: 3 ([id number])  Complications: No apparent anesthesia complications

## 2019-04-28 NOTE — Anesthesia Postprocedure Evaluation (Signed)
Anesthesia Post Note  Patient: Miranda Fuller  Procedure(s) Performed: RIGHT BREAST LUMPECTOMY WITH RADIOACTIVE SEED LOCALIZATION (Right Breast)     Patient location during evaluation: PACU Anesthesia Type: General Level of consciousness: awake and alert Pain management: pain level controlled Vital Signs Assessment: post-procedure vital signs reviewed and stable Respiratory status: spontaneous breathing, nonlabored ventilation, respiratory function stable and patient connected to nasal cannula oxygen Cardiovascular status: blood pressure returned to baseline and stable Postop Assessment: no apparent nausea or vomiting Anesthetic complications: no    Last Vitals:  Vitals:   04/28/19 1231 04/28/19 1245  BP:  (!) 155/80  Pulse: 84 77  Resp: 15 (!) 23  Temp: 36.5 C   SpO2: 99% 100%    Last Pain:  Vitals:   04/28/19 1245  TempSrc:   PainSc: 0-No pain                 Nadir Vasques S

## 2019-04-28 NOTE — Op Note (Signed)
04/28/2019  12:18 PM  PATIENT:  Miranda Fuller  69 y.o. female  PRE-OPERATIVE DIAGNOSIS:  RIGHT BREAST DUCTAL CARCINOMA IN SITU  POST-OPERATIVE DIAGNOSIS:  RIGHT BREAST DUCTAL CARCINOMA IN SITU  PROCEDURE:  Procedure(s): RIGHT BREAST CENTRAL LUMPECTOMY WITH RADIOACTIVE SEED LOCALIZATION (Right)  SURGEON:  Surgeon(s) and Role:    * Jovita Kussmaul, MD - Primary  PHYSICIAN ASSISTANT:   ASSISTANTS: none   ANESTHESIA:   local and general  EBL:  5 mL   BLOOD ADMINISTERED:none  DRAINS: none   LOCAL MEDICATIONS USED:  MARCAINE     SPECIMEN:  Source of Specimen:  RIGHT CENTRAL LUMPECTOMY WITH ADDITIONAL INFERIOR AND DEEP MARGIN  DISPOSITION OF SPECIMEN:  PATHOLOGY  COUNTS:  YES  TOURNIQUET:  * No tourniquets in log *  DICTATION: .Dragon Dictation   After informed consent was obtained the patient was brought to the operating room and placed in the supine position on the operating table. After adequate induction of general anesthesia the patient's right breast was prepped with Betadine and draped in usual sterile manner. An appropriate timeout was performed. Previously an I-125 seed was placed in the subareolar area of the right breast to mark a small area of ductal carcinoma in situ. The neoprobe was set to I-125 in the area of radioactivity was readily identified. Because of its close proximity to the skin of the areola and the nipple an elliptical incision was then made around the nipple and areola complex with a 15 blade knife. The incision was carried through the skin and subcutaneous tissue sharply with the electrocautery. The dissection was then carried out widely around the radioactive seed removing the central portion of the breast. Once the specimen was removed it was oriented with the appropriate paint colors. A specimen radiograph was obtained that showed the clip and seed to be near the center of the specimen but off to the inferior edge just a little bit. Because of this I  elected to take an additional inferior and deep margin and marked this appropriately. Hemostasis was achieved using the Bovie electrocautery. The wound was irrigated with saline and infiltrated with quarter percent Marcaine. The cavity was marked with clips. The specimens were sent to pathology for further evaluation. The wound was then closed with layers of interrupted 3-0 Vicryl stitches. The skin was closed with a running 4-0 Monocryl subcuticular stitch. Dermabond dressings were applied. The patient tolerated the procedure well. At the end of the case all needle sponge and instrument counts were correct. The patient was then awakened and taken to recovery in stable condition.  PLAN OF CARE: Discharge to home after PACU  PATIENT DISPOSITION:  PACU - hemodynamically stable.   Delay start of Pharmacological VTE agent (>24hrs) due to surgical blood loss or risk of bleeding: not applicable

## 2019-04-29 ENCOUNTER — Encounter: Payer: Self-pay | Admitting: *Deleted

## 2019-04-29 LAB — SURGICAL PATHOLOGY

## 2019-05-02 ENCOUNTER — Other Ambulatory Visit: Payer: Medicare Other

## 2019-05-02 ENCOUNTER — Encounter: Payer: Self-pay | Admitting: *Deleted

## 2019-05-04 ENCOUNTER — Encounter (INDEPENDENT_AMBULATORY_CARE_PROVIDER_SITE_OTHER): Payer: Medicare Other | Admitting: Internal Medicine

## 2019-05-05 ENCOUNTER — Encounter: Payer: Self-pay | Admitting: *Deleted

## 2019-05-09 ENCOUNTER — Ambulatory Visit: Payer: Medicare Other | Admitting: Hematology and Oncology

## 2019-05-11 ENCOUNTER — Telehealth: Payer: Self-pay

## 2019-05-12 ENCOUNTER — Ambulatory Visit: Payer: Medicare Other | Admitting: Radiation Oncology

## 2019-05-12 ENCOUNTER — Other Ambulatory Visit: Payer: Self-pay

## 2019-05-12 ENCOUNTER — Ambulatory Visit
Admission: RE | Admit: 2019-05-12 | Discharge: 2019-05-12 | Disposition: A | Discharge status: Home / Self Care | Payer: Medicare Other | Source: Ambulatory Visit | Attending: Radiation Oncology | Admitting: Radiation Oncology

## 2019-05-12 ENCOUNTER — Encounter: Payer: Self-pay | Admitting: Radiation Oncology

## 2019-05-12 DIAGNOSIS — D0511 Intraductal carcinoma in situ of right breast: Secondary | ICD-10-CM

## 2019-05-12 NOTE — Progress Notes (Signed)
Radiation Oncology         ([phone]) [phone] ________________________________  Outpatient Follow Up - Conducted via telephone due to current COVID-19 concerns for limiting patient exposure  I spoke with the patient to conduct this consult visit via telephone to spare the patient unnecessary potential exposure in the healthcare setting during the current COVID-19 pandemic. The patient was notified in advance and was offered a [place] meeting to allow for face to face communication but unfortunately reported that they did not have the appropriate resources/technology to support such a visit and instead preferred to proceed with a telephone visit.  ________________________________  Name: Miranda Fuller        MRN: [id number]  Date of Service: 05/12/2019 DOB: November 16, 1950  UY:[id number], Silvestre Moment, MD  Jovita Kussmaul, MD     REFERRING PHYSICIAN: Jovita Kussmaul, MD   DIAGNOSIS: The encounter diagnosis was Ductal carcinoma in situ (DCIS) of right breast.   HISTORY OF PRESENT ILLNESS: Miranda Fuller is a 69 y.o. female with a recent diagnosis of right breast cancer. The patient was noted to have bilateral nipple discharge and old blood. She has had these symptoms for at least 3 years and has even had cytologic evaluation of this fluid that per report was negative for any abnormalities. More recently the patient noted progressive discharge  with darker features that looked like old blood. She presented for diagnostic imaging. Mammography revealed calcifications in the right breast that were about 3.8 mm, and she did not have any ultrasound correlate. An MRI of bilateral breasts also was performed and did not clearly show a mass. Her axillae were negative for adenopathy. She was encouraged to proceed with stereotactic biopsy and this was performed on 02/07/2019 and revealed an intermediate grade DCIS. The cancer was ER/PR positive.  Of note in looking through her imaging, she had a recent CXR in October 2020 that also  showed a 7 mm LUL nodule. In further discussion, she reports she previously lived in [place] and had been diagnosed with coccidioidomycosis.  She proceeded to be seen by pulmonary medicine and was given permission to move forward surgically and to be followed expectantly as her nodule was not of concern in her episodes at times of shortness of breath was felt to be related to conditioning.  She proceeded to the operating room on 04/28/2019 where she underwent right breast lumpectomy revealing a 1.6 cm intermediate grade DCIS.  There were associated fibrocystic changes, her margins were negative for disease.  She is contacted today by phone to discuss the rationale for adjuvant radiotherapy. She has expressed interest to our nursing staff that she may want to have therapy in [place].     PREVIOUS RADIATION THERAPY: No   PAST MEDICAL HISTORY:  Past Medical History:  Diagnosis Date  . Breast mass 12/27/2018  . Coccidioidomycosis, pulmonary ([place])   . Complication of anesthesia   . DDD (degenerative disc disease), cervical   . Dementia ([place])    Early per Dr. Luan Pulling  . Depression 2009  . DM (diabetes mellitus screen) 2009  . Family history of bone cancer   . Family history of brain cancer   . Family history of breast cancer   . Family history of esophageal cancer   . Family history of lung cancer   . Glaucoma   . HTN (hypertension)   . Morbid obesity ([place])   . Nodule of upper lobe of left lung 12/27/2018  . PONV (postoperative nausea and vomiting)   .  Sleep apnea    had surgery  . Type II diabetes mellitus, uncontrolled ([place]) 01/31/2019       PAST SURGICAL HISTORY: Past Surgical History:  Procedure Laterality Date  . ABDOMINAL HYSTERECTOMY     TAH BSO  . BREAST BIOPSY Right   . BREAST LUMPECTOMY WITH RADIOACTIVE SEED LOCALIZATION Right 04/28/2019   Procedure: RIGHT BREAST LUMPECTOMY WITH RADIOACTIVE SEED LOCALIZATION;  Surgeon: Jovita Kussmaul, MD;  Location: [facility];   Service: General;  Laterality: Right;  . CHOLECYSTECTOMY    . TUBAL LIGATION    . UVULOPALATOPHARYNGOPLASTY       FAMILY HISTORY:  Family History  Problem Relation Age of Onset  . Heart disease Mother        "enlarged heart and leaking valves"  . COPD Mother   . Diabetes Mother   . Breast cancer Mother 52  . Cancer Mother        cancer involving the colon, esophagus, and liver  . Peripheral vascular disease Father 49  . Heart disease Father   . Diabetes Maternal Grandfather   . Polycystic kidney disease Sister   . Polycystic kidney disease Brother   . Hypertension Daughter   . Breast cancer Maternal Grandmother 80  . Breast cancer Other 71       maternal grandmother's sister  . Brain cancer Other 68  . Lung cancer Other      SOCIAL HISTORY:  reports that she has never smoked. She has never used smokeless tobacco. She reports current alcohol use. She reports that she does not use drugs. The patient is married and lives in [place]. She prefers her care in [place]. She has a daughter who is a Marine scientist. The patient is originally from [place].    ALLERGIES: Oxycodone-acetaminophen, Propoxyphene, Demerol [meperidine], Morphine and related, Oxycodone-aspirin, and Chlorhexidine   MEDICATIONS:  Current Outpatient Medications  Medication Sig Dispense Refill  . doxepin (SINEQUAN) 10 MG capsule Take by mouth.    Marland Kitchen ascorbic Acid (VITAMIN C) 500 MG CPCR Take by mouth.    Marland Kitchen aspirin 81 MG tablet Take 1 tablet by mouth daily.    . Calcium Carbonate-Vit D-Min (CALCIUM 1200 PO) Take 1,200 mg by mouth 2 (two) times daily.    . Cholecalciferol (VITAMIN D-3) 25 MCG (1000 UT) CAPS Take 3 capsules by mouth daily.    . insulin aspart (NOVOLOG) 100 UNIT/ML injection Inject 5-15 Units into the skin 3 (three) times daily with meals. 5-15 units on sliding scale     . insulin glargine (LANTUS) 100 UNIT/ML injection Inject 60 Units into the skin at bedtime.     . isosorbide mononitrate  (IMDUR) 30 MG 24 hr tablet Take 1 tablet (30 mg total) by mouth daily. 90 tablet 1  . lisinopril-hydrochlorothiazide (ZESTORETIC) 10-12.5 MG tablet Take 1 tablet by mouth daily.    . metFORMIN (GLUCOPHAGE-XR) 500 MG 24 hr tablet Take 500 mg by mouth daily with breakfast.    . Multiple Vitamins-Minerals (MULTIVITAMIN ADULT PO) Take by mouth daily.    . Pediatric Multivitamins-Iron Vicki Mallet W/IRON PO) Take 2 tablets by mouth daily.    . traMADol (ULTRAM) 50 MG tablet Take 1-2 tablets (50-100 mg total) by mouth every 6 (six) hours as needed. 20 tablet 0  . venlafaxine XR (EFFEXOR-XR) 75 MG 24 hr capsule Take 75 mg by mouth daily.    . vitamin B-12 (CYANOCOBALAMIN) 500 MCG tablet Take 1,000 mcg by mouth 2 (two) times daily.  No current facility-administered medications for this encounter.     REVIEW OF SYSTEMS: On review of systems, the patient reports that she is doing well overall. She reports she's done well since surgery and has even been able to wear an underwire bra comfortably. She denies any concerns with her healing or changes of her skin or breast. She denies any chest pain, shortness of breath, cough, fevers, chills, night sweats, unintended weight changes. She denies any bowel or bladder disturbances, and denies abdominal pain, nausea or vomiting. She denies any new musculoskeletal or joint aches or pains, new skin lesions or concerns. A complete review of systems is obtained and is otherwise negative.   PHYSICAL EXAM:  Unable to assess due to encounter type.  ECOG = 1  0 - Asymptomatic (Fully active, able to carry on all predisease activities without restriction)  1 - Symptomatic but completely ambulatory (Restricted in physically strenuous activity but ambulatory and able to carry out work of a light or sedentary nature. For example, light housework, office work)  2 - Symptomatic, <50% in bed during the day (Ambulatory and capable of all self care but unable to carry out any  work activities. Up and about more than 50% of waking hours)  3 - Symptomatic, >50% in bed, but not bedbound (Capable of only limited self-care, confined to bed or chair 50% or more of waking hours)  4 - Bedbound (Completely disabled. Cannot carry on any self-care. Totally confined to bed or chair)  5 - Death   Eustace Pen MM, Creech RH, Tormey DC, et al. [phone]). "Toxicity and response criteria of the [facility] Group". [place] Oncol. 5 (6): 649-55    LABORATORY DATA:  Lab Results  Component Value Date   WBC 7.7 02/22/2019   HGB 14.5 02/22/2019   HCT 43.4 02/22/2019   MCV 94.6 02/22/2019   PLT 246 02/22/2019   Lab Results  Component Value Date   NA 137 04/25/2019   K 4.3 04/25/2019   CL 105 04/25/2019   CO2 23 04/25/2019   Lab Results  Component Value Date   ALT 12 02/22/2019   AST 13 (L) 02/22/2019   ALKPHOS 69 02/22/2019   BILITOT 0.5 02/22/2019      RADIOGRAPHY: MM Breast Surgical Specimen  Result Date: 04/28/2019 CLINICAL DATA:  Status post surgical excision of the right breast. EXAM: SPECIMEN RADIOGRAPH OF THE RIGHT BREAST COMPARISON:  Previous exam(s). FINDINGS: Status post excision of the right breast. The radioactive seed and biopsy marker clip are present, completely intact, and were marked for pathology. IMPRESSION: Specimen radiograph of the right breast. Electronically Signed   By: Lillia Mountain M.D.   On: 04/28/2019 12:07   MM RT RADIOACTIVE SEED LOC MAMMO GUIDE  Result Date: 04/27/2019 CLINICAL DATA:  Localization for surgery EXAM: MAMMOGRAPHIC GUIDED RADIOACTIVE SEED LOCALIZATION OF THE RIGHT BREAST COMPARISON:  Previous exam(s). FINDINGS: Patient presents for radioactive seed localization prior to surgery. I met with the patient and we discussed the procedure of seed localization including benefits and alternatives. We discussed the high likelihood of a successful procedure. We discussed the risks of the procedure including infection, bleeding,  tissue injury and further surgery. We discussed the low dose of radioactivity involved in the procedure. Informed, written consent was given. The usual time-out protocol was performed immediately prior to the procedure. Using mammographic guidance, sterile technique, 1% lidocaine and an I-125 radioactive seed, the biopsy clip was localized using a lateral approach. The follow-up mammogram images  confirm the seed in the expected location and were marked for the surgeon. Follow-up survey of the patient confirms presence of the radioactive seed. Order number of I-125 seed:  [id number]. Total activity:  5.830 millicuries reference Date: March 09, 2019 The patient tolerated the procedure well and was released from the [facility]. She was given instructions regarding seed removal. IMPRESSION: Radioactive seed localization right breast. No apparent complications. Electronically Signed   By: Dorise Bullion III M.D   On: 04/27/2019 13:48       IMPRESSION/PLAN: 1. ER/PR positive intermediate grade DCIS of the right breast. Dr. Lisbeth Renshaw discusses the final results of pathology and reviews the nature of noninvasive breast disease. We spent time today discussing the rationale for radiotherapy in patients who undergo lumpectomy as the goes of treatment would be to reduce the risks of local recurrence and the support for this therapy per national treatment guidelines. She is interested in discussing this further as she was under the impression that since surgery was so successful she may not need anything further. I reiterated that there are clinical trials ongoing such as [employer] looking at how we treat DCIS, and that perhaps the results of this trial may change the approach, however even in great surgical results, current guidelines support a role for adjuvant radiotherapy. She will meet with Dr. [name] next week to finalize her decision making. She also expressed that if she elects for treatment with radiotherapy, she would  prefer it closer to home in [place]. I gave her the contact information for [facility]'s clinic, and let her know that we would be happy to revisit our discussion today. I suspect she would be offered a 4 week course of treatment with Dr. Francesca Jewett. She is in agreement with this plan and will Fuller [place] back if she needs a referral to see Dr. Francesca Jewett. 2. History of left upper lobe nodule with history of coccidioidomycosis.  The patient will follow up as needed with pulmonary medicine.   Given current concerns for patient exposure during the COVID-19 pandemic, this encounter was conducted via telephone.  The patient has given verbal consent for this type of encounter. In a visit lasting 30 minutes, greater than 50% of the time was spent discussing the patient's condition, in preparation for the discussion, and coordinating the patient's care. The attendants for this meeting include  Shona Simpson, [facility] and Matilde Haymaker  During the encounter, Shona Simpson [facility] was located at [facility] Radiation Oncology Department.  MAIKO SALAIS  was located at home.  Carola Rhine, PAC

## 2019-05-16 ENCOUNTER — Encounter: Payer: Self-pay | Admitting: *Deleted

## 2019-05-17 ENCOUNTER — Telehealth: Payer: Self-pay | Admitting: *Deleted

## 2019-05-17 DIAGNOSIS — D0511 Intraductal carcinoma in situ of right breast: Secondary | ICD-10-CM

## 2019-05-17 NOTE — Telephone Encounter (Signed)
Spoke to pt regarding xrt. Pt has decided to move forward and would like to have it at [facility] as it's closest to her home. Referral placed.

## 2019-05-18 ENCOUNTER — Telehealth: Payer: Self-pay | Admitting: *Deleted

## 2019-05-18 NOTE — Telephone Encounter (Signed)
Referral faxed to [facility] for pt to see Dr. Francesca Jewett. Release CD:[id number]

## 2019-05-19 ENCOUNTER — Encounter: Payer: Self-pay | Admitting: *Deleted

## 2019-05-23 ENCOUNTER — Encounter: Payer: Self-pay | Admitting: *Deleted

## 2019-05-30 NOTE — Telephone Encounter (Signed)
remdinded patient of follow up appointment which is a telephone encounter. 

## 2019-05-31 DIAGNOSIS — Z510 Encounter for antineoplastic radiation therapy: Secondary | ICD-10-CM | POA: Diagnosis not present

## 2019-05-31 DIAGNOSIS — D0511 Intraductal carcinoma in situ of right breast: Secondary | ICD-10-CM | POA: Diagnosis not present

## 2019-06-03 DIAGNOSIS — D0511 Intraductal carcinoma in situ of right breast: Secondary | ICD-10-CM | POA: Diagnosis not present

## 2019-06-03 DIAGNOSIS — Z510 Encounter for antineoplastic radiation therapy: Secondary | ICD-10-CM | POA: Diagnosis not present

## 2019-06-06 ENCOUNTER — Encounter: Payer: Self-pay | Admitting: *Deleted

## 2019-06-06 ENCOUNTER — Telehealth: Payer: Self-pay | Admitting: Hematology and Oncology

## 2019-06-06 DIAGNOSIS — D0511 Intraductal carcinoma in situ of right breast: Secondary | ICD-10-CM | POA: Diagnosis not present

## 2019-06-06 DIAGNOSIS — Z510 Encounter for antineoplastic radiation therapy: Secondary | ICD-10-CM | POA: Diagnosis not present

## 2019-06-06 NOTE — Telephone Encounter (Signed)
Scheduled appt per 3/22 sch message - mailed letter with appt date and time

## 2019-06-07 DIAGNOSIS — R1013 Epigastric pain: Secondary | ICD-10-CM | POA: Diagnosis not present

## 2019-06-07 DIAGNOSIS — R1011 Right upper quadrant pain: Secondary | ICD-10-CM | POA: Diagnosis not present

## 2019-06-14 DIAGNOSIS — D0511 Intraductal carcinoma in situ of right breast: Secondary | ICD-10-CM | POA: Diagnosis not present

## 2019-06-14 DIAGNOSIS — Z510 Encounter for antineoplastic radiation therapy: Secondary | ICD-10-CM | POA: Diagnosis not present

## 2019-06-20 DIAGNOSIS — D0511 Intraductal carcinoma in situ of right breast: Secondary | ICD-10-CM | POA: Diagnosis not present

## 2019-06-20 DIAGNOSIS — Z510 Encounter for antineoplastic radiation therapy: Secondary | ICD-10-CM | POA: Diagnosis not present

## 2019-06-21 DIAGNOSIS — Z510 Encounter for antineoplastic radiation therapy: Secondary | ICD-10-CM | POA: Diagnosis not present

## 2019-06-21 DIAGNOSIS — D0511 Intraductal carcinoma in situ of right breast: Secondary | ICD-10-CM | POA: Diagnosis not present

## 2019-06-22 DIAGNOSIS — D0511 Intraductal carcinoma in situ of right breast: Secondary | ICD-10-CM | POA: Diagnosis not present

## 2019-06-22 DIAGNOSIS — Z510 Encounter for antineoplastic radiation therapy: Secondary | ICD-10-CM | POA: Diagnosis not present

## 2019-06-23 DIAGNOSIS — D0511 Intraductal carcinoma in situ of right breast: Secondary | ICD-10-CM | POA: Diagnosis not present

## 2019-06-23 DIAGNOSIS — Z510 Encounter for antineoplastic radiation therapy: Secondary | ICD-10-CM | POA: Diagnosis not present

## 2019-06-24 DIAGNOSIS — Z510 Encounter for antineoplastic radiation therapy: Secondary | ICD-10-CM | POA: Diagnosis not present

## 2019-06-24 DIAGNOSIS — D0511 Intraductal carcinoma in situ of right breast: Secondary | ICD-10-CM | POA: Diagnosis not present

## 2019-06-27 DIAGNOSIS — Z510 Encounter for antineoplastic radiation therapy: Secondary | ICD-10-CM | POA: Diagnosis not present

## 2019-06-27 DIAGNOSIS — D0511 Intraductal carcinoma in situ of right breast: Secondary | ICD-10-CM | POA: Diagnosis not present

## 2019-06-28 DIAGNOSIS — Z510 Encounter for antineoplastic radiation therapy: Secondary | ICD-10-CM | POA: Diagnosis not present

## 2019-06-28 DIAGNOSIS — D0511 Intraductal carcinoma in situ of right breast: Secondary | ICD-10-CM | POA: Diagnosis not present

## 2019-06-29 DIAGNOSIS — Z510 Encounter for antineoplastic radiation therapy: Secondary | ICD-10-CM | POA: Diagnosis not present

## 2019-06-29 DIAGNOSIS — D0511 Intraductal carcinoma in situ of right breast: Secondary | ICD-10-CM | POA: Diagnosis not present

## 2019-06-30 DIAGNOSIS — Z510 Encounter for antineoplastic radiation therapy: Secondary | ICD-10-CM | POA: Diagnosis not present

## 2019-06-30 DIAGNOSIS — D0511 Intraductal carcinoma in situ of right breast: Secondary | ICD-10-CM | POA: Diagnosis not present

## 2019-07-01 DIAGNOSIS — Z510 Encounter for antineoplastic radiation therapy: Secondary | ICD-10-CM | POA: Diagnosis not present

## 2019-07-01 DIAGNOSIS — D0511 Intraductal carcinoma in situ of right breast: Secondary | ICD-10-CM | POA: Diagnosis not present

## 2019-07-04 DIAGNOSIS — Z510 Encounter for antineoplastic radiation therapy: Secondary | ICD-10-CM | POA: Diagnosis not present

## 2019-07-04 DIAGNOSIS — D0511 Intraductal carcinoma in situ of right breast: Secondary | ICD-10-CM | POA: Diagnosis not present

## 2019-07-05 DIAGNOSIS — Z510 Encounter for antineoplastic radiation therapy: Secondary | ICD-10-CM | POA: Diagnosis not present

## 2019-07-05 DIAGNOSIS — D0511 Intraductal carcinoma in situ of right breast: Secondary | ICD-10-CM | POA: Diagnosis not present

## 2019-07-06 DIAGNOSIS — D0511 Intraductal carcinoma in situ of right breast: Secondary | ICD-10-CM | POA: Diagnosis not present

## 2019-07-06 DIAGNOSIS — Z510 Encounter for antineoplastic radiation therapy: Secondary | ICD-10-CM | POA: Diagnosis not present

## 2019-07-07 DIAGNOSIS — Z510 Encounter for antineoplastic radiation therapy: Secondary | ICD-10-CM | POA: Diagnosis not present

## 2019-07-07 DIAGNOSIS — D0511 Intraductal carcinoma in situ of right breast: Secondary | ICD-10-CM | POA: Diagnosis not present

## 2019-07-08 DIAGNOSIS — D0511 Intraductal carcinoma in situ of right breast: Secondary | ICD-10-CM | POA: Diagnosis not present

## 2019-07-08 DIAGNOSIS — Z510 Encounter for antineoplastic radiation therapy: Secondary | ICD-10-CM | POA: Diagnosis not present

## 2019-07-11 ENCOUNTER — Encounter: Payer: Self-pay | Admitting: *Deleted

## 2019-07-11 DIAGNOSIS — D0511 Intraductal carcinoma in situ of right breast: Secondary | ICD-10-CM | POA: Diagnosis not present

## 2019-07-11 DIAGNOSIS — Z510 Encounter for antineoplastic radiation therapy: Secondary | ICD-10-CM | POA: Diagnosis not present

## 2019-07-13 DIAGNOSIS — R739 Hyperglycemia, unspecified: Secondary | ICD-10-CM | POA: Diagnosis not present

## 2019-07-13 DIAGNOSIS — I10 Essential (primary) hypertension: Secondary | ICD-10-CM | POA: Diagnosis not present

## 2019-07-13 DIAGNOSIS — R5383 Other fatigue: Secondary | ICD-10-CM | POA: Diagnosis not present

## 2019-07-13 DIAGNOSIS — Z13220 Encounter for screening for lipoid disorders: Secondary | ICD-10-CM | POA: Diagnosis not present

## 2019-07-13 DIAGNOSIS — E1165 Type 2 diabetes mellitus with hyperglycemia: Secondary | ICD-10-CM | POA: Diagnosis not present

## 2019-07-14 ENCOUNTER — Other Ambulatory Visit: Payer: Self-pay | Admitting: Hematology and Oncology

## 2019-07-14 DIAGNOSIS — E2839 Other primary ovarian failure: Secondary | ICD-10-CM

## 2019-07-18 ENCOUNTER — Ambulatory Visit
Admission: RE | Admit: 2019-07-18 | Discharge: 2019-07-18 | Disposition: A | Discharge status: Home / Self Care | Payer: Medicare Other | Source: Ambulatory Visit | Attending: Hematology and Oncology | Admitting: Hematology and Oncology

## 2019-07-18 ENCOUNTER — Other Ambulatory Visit: Payer: Self-pay

## 2019-07-18 DIAGNOSIS — H409 Unspecified glaucoma: Secondary | ICD-10-CM | POA: Diagnosis not present

## 2019-07-18 DIAGNOSIS — I10 Essential (primary) hypertension: Secondary | ICD-10-CM | POA: Diagnosis not present

## 2019-07-18 DIAGNOSIS — M85852 Other specified disorders of bone density and structure, left thigh: Secondary | ICD-10-CM | POA: Diagnosis not present

## 2019-07-18 DIAGNOSIS — C50919 Malignant neoplasm of unspecified site of unspecified female breast: Secondary | ICD-10-CM | POA: Diagnosis not present

## 2019-07-18 DIAGNOSIS — E1165 Type 2 diabetes mellitus with hyperglycemia: Secondary | ICD-10-CM | POA: Diagnosis not present

## 2019-07-18 DIAGNOSIS — Z780 Asymptomatic menopausal state: Secondary | ICD-10-CM | POA: Diagnosis not present

## 2019-07-18 DIAGNOSIS — E2839 Other primary ovarian failure: Secondary | ICD-10-CM

## 2019-07-20 NOTE — Progress Notes (Signed)
Patient Care Team: Sasser, Silvestre Moment, MD as PCP - General (Family Medicine) Burnell Blanks, MD as PCP - Cardiology (Cardiology) Mauro Kaufmann, RN as Oncology Nurse Navigator Rockwell Germany, RN as Oncology Nurse Navigator  DIAGNOSIS:    ICD-10-CM   1. Ductal carcinoma in situ (DCIS) of right breast  D05.11     SUMMARY OF ONCOLOGIC HISTORY: Oncology History  Ductal carcinoma in situ (DCIS) of right breast  12/27/2018 Initial Diagnosis   Patient noted bilateral dark red spontaneous nipple discharge and a left breast lump palpable on exam. Mammogram showed calcifications in the right breast spanning 3.48m and no correlate for the previously palpable left breast lump. Breast MRI showed no evidence of malignancy bilaterally. Right breast biopsy showed intermediate grade DCIS, ER+ 100%, PR+ 70%.    03/01/2019 Genetic Testing   Negative genetic testing:  No pathogenic variants detected on the [facility] Breast Cancer STAT panel or the [facility] Multi-Cancer panel. A variant of uncertain significance was detected in the APC gene called c.449A>G. The report date is 03/01/2019.  The Breast Cancer STAT panel offered by [facility] includes sequencing and rearrangement analysis for the following 9 genes:  ATM, BRCA1, BRCA2, CDH1, CHEK2, PALB2, PTEN, STK11 and TP53.  The Multi-Cancer Panel offered by [facility] includes sequencing and/or deletion duplication testing of the following 85 genes: AIP, ALK, APC, ATM, AXIN2,BAP1,  BARD1, BLM, BMPR1A, BRCA1, BRCA2, BRIP1, CASR, CDC73, CDH1, CDK4, CDKN1B, CDKN1C, CDKN2A (p14ARF), CDKN2A (p16INK4a), CEBPA, CHEK2, CTNNA1, DICER1, DIS3L2, EGFR (c.2369C>T, p.Thr790Met variant only), EPCAM (Deletion/duplication testing only), FH, FLCN, GATA2, GPC3, GREM1 (Promoter region deletion/duplication testing only), HOXB13 (c.251G>A, p.Gly84Glu), HRAS, KIT, MAX, MEN1, MET, MITF (c.952G>A, p.Glu318Lys variant only), MLH1, MSH2, MSH3, MSH6, MUTYH, NBN, NF1, NF2, NTHL1, PALB2,  PDGFRA, PHOX2B, PMS2, POLD1, POLE, POT1, PRKAR1A, PTCH1, PTEN, RAD50, RAD51C, RAD51D, RB1, RECQL4, RET, RNF43, RUNX1, SDHAF2, SDHA (sequence changes only), SDHB, SDHC, SDHD, SMAD4, SMARCA4, SMARCB1, SMARCE1, STK11, SUFU, TERC, TERT, TMEM127, TP53, TSC1, TSC2, VHL, WRN and WT1.     04/28/2019 Cancer Staging   Staging form: Breast, AJCC 8th Edition - Pathologic stage from 04/28/2019: Stage 0 (pTis (DCIS), pN0, cM0, ER+, PR+) - Signed by CGardenia Phlegm NP on 05/18/2019   04/28/2019 Surgery   Right lumpectomy ([name]: intermediate grade DCIS, clear margins   06/16/2019 - 07/11/2019 Radiation Therapy   [facility]     CHIEF COMPLIANT: Follow-up to discuss antiestrogen therapy  INTERVAL HISTORY: Miranda Fuller a 69y.o. with above-mentioned history of right breast DCIS. She underwent a right lumpectomy on 04/28/19 with Dr. TMarlou Starksfor which pathology showed intermediate grade DCIS, clear margins. [facility] on 07/18/19 showed osteopenia with a T-score of -2.4 at the left femur neck. She presents to the clinic today to discuss antiestrogen therapy.   ALLERGIES:  is allergic to oxycodone-acetaminophen; propoxyphene; demerol [meperidine]; morphine and related; oxycodone-aspirin; and chlorhexidine.  MEDICATIONS:  Current Outpatient Medications  Medication Sig Dispense Refill  . aspirin 81 MG tablet Take 1 tablet by mouth daily.    . Calcium Carbonate-Vit D-Min (CALCIUM 1200 PO) Take 1,200 mg by mouth 2 (two) times daily.    . Cholecalciferol (VITAMIN D-3) 25 MCG (1000 UT) CAPS Take 3 capsules by mouth daily.    . insulin aspart (NOVOLOG) 100 UNIT/ML injection Inject 5-15 Units into the skin 3 (three) times daily with meals. 5-15 units on sliding scale     . insulin glargine (LANTUS) 100 UNIT/ML injection Inject  60 Units into the skin at bedtime.     Marland Kitchen lisinopril-hydrochlorothiazide (ZESTORETIC) 10-12.5 MG tablet Take 1 tablet by mouth daily.    .  metFORMIN (GLUCOPHAGE-XR) 500 MG 24 hr tablet Take 500 mg by mouth daily with breakfast.    . Multiple Vitamins-Minerals (MULTIVITAMIN ADULT PO) Take by mouth daily.    . Pediatric Multivitamins-Iron Vicki Mallet W/IRON PO) Take 2 tablets by mouth daily.    . tamoxifen (NOLVADEX) 20 MG tablet Take 1 tablet (20 mg total) by mouth daily. 90 tablet 3  . traMADol (ULTRAM) 50 MG tablet Take 1-2 tablets (50-100 mg total) by mouth every 6 (six) hours as needed. 20 tablet 0  . venlafaxine XR (EFFEXOR-XR) 75 MG 24 hr capsule Take 75 mg by mouth daily.    . vitamin B-12 (CYANOCOBALAMIN) 500 MCG tablet Take 1,000 mcg by mouth 2 (two) times daily.     No current facility-administered medications for this visit.    PHYSICAL EXAMINATION: ECOG PERFORMANCE STATUS: 1 - Symptomatic but completely ambulatory  Vitals:   07/21/19 1021  BP: (!) 168/97  Pulse: 83  Resp: 17  Temp: 98.2 F (36.8 C)  SpO2: 99%   Filed Weights   07/21/19 1021  Weight: 209 lb 11.2 oz (95.1 kg)    LABORATORY DATA:  I have reviewed the data as listed CMP Latest Ref Rng & Units 04/25/2019 02/22/2019 01/31/2019  Glucose 70 - 99 mg/dL 148(H) 219(H) 196(H)  BUN 8 - 23 mg/dL _0 Creatinine 0.44 - 1.00 mg/dL 0.76 0.80 0.70  Sodium 135 - 145 mmol/L 137 138 137  Potassium 3.5 - 5.1 mmol/L 4.3 4.3 4.0  Chloride 98 - 111 mmol/L 105 103 102  CO2 22 - 32 mmol/L _1 Calcium 8.9 - 10.3 mg/dL 9.0 9.0 9.2  Total Protein 6.5 - 8.1 g/dL - 7.2 6.6  Total Bilirubin 0.3 - 1.2 mg/dL - 0.5 0.7  Alkaline Phos 38 - 126 U/L - 69 -  AST 15 - 41 U/L - 13(L) 13  ALT 0 - 44 U/L - 12 12    Lab Results  Component Value Date   WBC 7.7 02/22/2019   HGB 14.5 02/22/2019   HCT 43.4 02/22/2019   MCV 94.6 02/22/2019   PLT 246 02/22/2019   NEUTROABS 4.1 02/22/2019    ASSESSMENT & PLAN:  Ductal carcinoma in situ (DCIS) of right breast 12/27/2018:Patient noted bilateral dark red spontaneous nipple discharge and a left breast lump  palpable on exam. Mammogram showed calcifications in the right breast spanning 3.44m and no correlate for the previously palpable left breast lump. Breast MRI showed no evidence of malignancy bilaterally. Right breast biopsy showed intermediate grade DCIS, ER+ 100%, PR+ 70%.   04/28/2019: Right lumpectomy ([name]: intermediate grade DCIS, clear margins ER 100%, PR 70% [facility] Plan: Adjuvant antiestrogen therapy with tamoxifen 20 mg daily x5 years Tamoxifen counseling:We discussed the risks and benefits of tamoxifen. These include but not limited to insomnia, hot flashes, mood changes, vaginal dryness, and weight gain. Although rare, serious side effects including endometrial cancer, risk of blood clots were also discussed. We strongly believe that the benefits far outweigh the risks. Patient understands these risks and consented to starting treatment. Planned treatment duration is 5 years.  Patient consented to participating in antiestrogen therapy adherence and compliance study. Return to clinic in 3 months for survivorship care plan visit.     No orders of the defined types were placed in  this encounter.  The patient has a good understanding of the overall plan. she agrees with it. she will call with any problems that may develop before the next visit here.  Total time spent: 30 mins including face to face time and time spent for planning, charting and coordination of care  Nicholas Lose, MD 07/21/2019  I, Cloyde Reams Dorshimer, am acting as scribe for Dr. Nicholas Lose.  I have reviewed the above documentation for accuracy and completeness, and I agree with the above.

## 2019-07-21 ENCOUNTER — Inpatient Hospital Stay: Payer: Medicare Other | Attending: Hematology and Oncology | Admitting: Hematology and Oncology

## 2019-07-21 ENCOUNTER — Other Ambulatory Visit: Payer: Self-pay

## 2019-07-21 DIAGNOSIS — D0511 Intraductal carcinoma in situ of right breast: Secondary | ICD-10-CM | POA: Insufficient documentation

## 2019-07-21 DIAGNOSIS — Z79810 Long term (current) use of selective estrogen receptor modulators (SERMs): Secondary | ICD-10-CM | POA: Diagnosis not present

## 2019-07-21 DIAGNOSIS — Z170 Estrogen receptor positive status [ER+]: Secondary | ICD-10-CM | POA: Diagnosis not present

## 2019-07-21 DIAGNOSIS — M85852 Other specified disorders of bone density and structure, left thigh: Secondary | ICD-10-CM | POA: Diagnosis not present

## 2019-07-21 DIAGNOSIS — Z79899 Other long term (current) drug therapy: Secondary | ICD-10-CM | POA: Diagnosis not present

## 2019-07-21 DIAGNOSIS — Z923 Personal history of irradiation: Secondary | ICD-10-CM | POA: Diagnosis not present

## 2019-07-21 MED ORDER — TAMOXIFEN CITRATE 20 MG PO TABS: 20.0000 mg | ORAL_TABLET | Freq: Every day | ORAL | 3 refills | Status: DC

## 2019-07-21 NOTE — Assessment & Plan Note (Signed)
12/27/2018:Patient noted bilateral dark red spontaneous nipple discharge and a left breast lump palpable on exam. Mammogram showed calcifications in the right breast spanning 3.49mm and no correlate for the previously palpable left breast lump. Breast MRI showed no evidence of malignancy bilaterally. Right breast biopsy showed intermediate grade DCIS, ER+ 100%, PR+ 70%.   04/28/2019: Right lumpectomy [name]): intermediate grade DCIS, clear margins ER 100%, PR 70% Adjuvant radiation at [facility]  Plan: Adjuvant antiestrogen therapy with tamoxifen 20 mg daily x5 years Tamoxifen counseling:We discussed the risks and benefits of tamoxifen. These include but not limited to insomnia, hot flashes, mood changes, vaginal dryness, and weight gain. Although rare, serious side effects including endometrial cancer, risk of blood clots were also discussed. We strongly believe that the benefits far outweigh the risks. Patient understands these risks and consented to starting treatment. Planned treatment duration is 5 years.  Patient consented to participating in antiestrogen therapy adherence and compliance study. Return to clinic in 3 months for survivorship care plan visit.

## 2019-07-22 ENCOUNTER — Telehealth: Payer: Self-pay | Admitting: Adult Health

## 2019-07-22 NOTE — Telephone Encounter (Signed)
Scheduled per 05/06 los, patient has been called and left a voicemail.

## 2019-08-10 DIAGNOSIS — D0511 Intraductal carcinoma in situ of right breast: Secondary | ICD-10-CM | POA: Diagnosis not present

## 2019-08-10 DIAGNOSIS — Z510 Encounter for antineoplastic radiation therapy: Secondary | ICD-10-CM | POA: Diagnosis not present

## 2019-10-25 ENCOUNTER — Telehealth: Payer: Self-pay | Admitting: Adult Health

## 2019-10-25 NOTE — Telephone Encounter (Signed)
Called pt per 8/10 sch msg - no answer. Left message for patient to call back if reschedule is still needed.

## 2019-10-27 ENCOUNTER — Inpatient Hospital Stay: Payer: Medicare Other | Admitting: Adult Health

## 2019-11-10 ENCOUNTER — Telehealth: Payer: Self-pay | Admitting: Adult Health

## 2019-11-10 NOTE — Telephone Encounter (Signed)
Rescheduled 8/30 appt per provider request (will not be in the office that day). Pt confirmed new appt date and time.

## 2019-11-14 ENCOUNTER — Inpatient Hospital Stay: Payer: Medicare Other | Admitting: Adult Health

## 2019-11-14 DIAGNOSIS — D0511 Intraductal carcinoma in situ of right breast: Secondary | ICD-10-CM | POA: Diagnosis not present

## 2019-12-09 ENCOUNTER — Inpatient Hospital Stay: Payer: Medicare Other | Admitting: Adult Health

## 2019-12-14 DIAGNOSIS — Z853 Personal history of malignant neoplasm of breast: Secondary | ICD-10-CM | POA: Diagnosis not present

## 2019-12-14 DIAGNOSIS — D0511 Intraductal carcinoma in situ of right breast: Secondary | ICD-10-CM | POA: Diagnosis not present

## 2019-12-14 DIAGNOSIS — Z08 Encounter for follow-up examination after completed treatment for malignant neoplasm: Secondary | ICD-10-CM | POA: Diagnosis not present

## 2019-12-16 DIAGNOSIS — R739 Hyperglycemia, unspecified: Secondary | ICD-10-CM | POA: Diagnosis not present

## 2019-12-16 DIAGNOSIS — E1165 Type 2 diabetes mellitus with hyperglycemia: Secondary | ICD-10-CM | POA: Diagnosis not present

## 2019-12-16 DIAGNOSIS — I10 Essential (primary) hypertension: Secondary | ICD-10-CM | POA: Diagnosis not present

## 2019-12-16 DIAGNOSIS — E782 Mixed hyperlipidemia: Secondary | ICD-10-CM | POA: Diagnosis not present

## 2019-12-20 DIAGNOSIS — Z7189 Other specified counseling: Secondary | ICD-10-CM | POA: Diagnosis not present

## 2019-12-20 DIAGNOSIS — C50919 Malignant neoplasm of unspecified site of unspecified female breast: Secondary | ICD-10-CM | POA: Diagnosis not present

## 2019-12-20 DIAGNOSIS — E1165 Type 2 diabetes mellitus with hyperglycemia: Secondary | ICD-10-CM | POA: Diagnosis not present

## 2019-12-20 DIAGNOSIS — E782 Mixed hyperlipidemia: Secondary | ICD-10-CM | POA: Diagnosis not present

## 2019-12-20 DIAGNOSIS — I10 Essential (primary) hypertension: Secondary | ICD-10-CM | POA: Diagnosis not present

## 2019-12-30 ENCOUNTER — Encounter (HOSPITAL_COMMUNITY): Payer: Self-pay

## 2020-01-11 ENCOUNTER — Inpatient Hospital Stay: Payer: Medicare Other | Attending: Adult Health | Admitting: Adult Health

## 2020-01-11 ENCOUNTER — Telehealth: Payer: Self-pay | Admitting: Adult Health

## 2020-01-11 DIAGNOSIS — D0511 Intraductal carcinoma in situ of right breast: Secondary | ICD-10-CM

## 2020-01-11 NOTE — Telephone Encounter (Signed)
Called pt per 10/27 sch msg - no answer. Left message for patient to call back to reschedule appt.

## 2020-01-11 NOTE — Telephone Encounter (Signed)
Spoke to patient and she needs to reschedule as she has been exposed to [place].  I sent a schedule message to reschedule her to next week and that visit will be virtual.  We will mail her care plan information to her today.    Wilber Bihari, NP

## 2020-04-13 DIAGNOSIS — I10 Essential (primary) hypertension: Secondary | ICD-10-CM | POA: Diagnosis not present

## 2020-04-13 DIAGNOSIS — E782 Mixed hyperlipidemia: Secondary | ICD-10-CM | POA: Diagnosis not present

## 2020-04-13 DIAGNOSIS — E1165 Type 2 diabetes mellitus with hyperglycemia: Secondary | ICD-10-CM | POA: Diagnosis not present

## 2020-04-13 DIAGNOSIS — R739 Hyperglycemia, unspecified: Secondary | ICD-10-CM | POA: Diagnosis not present

## 2020-04-13 DIAGNOSIS — E7849 Other hyperlipidemia: Secondary | ICD-10-CM | POA: Diagnosis not present

## 2020-04-18 DIAGNOSIS — C50919 Malignant neoplasm of unspecified site of unspecified female breast: Secondary | ICD-10-CM | POA: Diagnosis not present

## 2020-04-18 DIAGNOSIS — Z1389 Encounter for screening for other disorder: Secondary | ICD-10-CM | POA: Diagnosis not present

## 2020-04-18 DIAGNOSIS — E1165 Type 2 diabetes mellitus with hyperglycemia: Secondary | ICD-10-CM | POA: Diagnosis not present

## 2020-04-18 DIAGNOSIS — E7849 Other hyperlipidemia: Secondary | ICD-10-CM | POA: Diagnosis not present

## 2020-04-18 DIAGNOSIS — I10 Essential (primary) hypertension: Secondary | ICD-10-CM | POA: Diagnosis not present

## 2020-05-10 DIAGNOSIS — D0511 Intraductal carcinoma in situ of right breast: Secondary | ICD-10-CM | POA: Diagnosis not present

## 2020-05-15 ENCOUNTER — Other Ambulatory Visit: Payer: Self-pay | Admitting: General Surgery

## 2020-05-15 DIAGNOSIS — Z853 Personal history of malignant neoplasm of breast: Secondary | ICD-10-CM

## 2020-05-15 DIAGNOSIS — D0511 Intraductal carcinoma in situ of right breast: Secondary | ICD-10-CM

## 2020-05-31 DIAGNOSIS — F32A Depression, unspecified: Secondary | ICD-10-CM | POA: Diagnosis not present

## 2020-05-31 DIAGNOSIS — Z08 Encounter for follow-up examination after completed treatment for malignant neoplasm: Secondary | ICD-10-CM | POA: Diagnosis not present

## 2020-05-31 DIAGNOSIS — Z86000 Personal history of in-situ neoplasm of breast: Secondary | ICD-10-CM | POA: Diagnosis not present

## 2020-05-31 DIAGNOSIS — Z923 Personal history of irradiation: Secondary | ICD-10-CM | POA: Diagnosis not present

## 2020-05-31 DIAGNOSIS — D0511 Intraductal carcinoma in situ of right breast: Secondary | ICD-10-CM | POA: Diagnosis not present

## 2020-06-19 DIAGNOSIS — I10 Essential (primary) hypertension: Secondary | ICD-10-CM | POA: Diagnosis not present

## 2020-06-19 DIAGNOSIS — R5383 Other fatigue: Secondary | ICD-10-CM | POA: Diagnosis not present

## 2020-06-19 DIAGNOSIS — E1165 Type 2 diabetes mellitus with hyperglycemia: Secondary | ICD-10-CM | POA: Diagnosis not present

## 2020-06-19 DIAGNOSIS — R42 Dizziness and giddiness: Secondary | ICD-10-CM | POA: Diagnosis not present

## 2020-06-19 DIAGNOSIS — E782 Mixed hyperlipidemia: Secondary | ICD-10-CM | POA: Diagnosis not present

## 2020-06-20 ENCOUNTER — Ambulatory Visit
Admission: RE | Admit: 2020-06-20 | Discharge: 2020-06-20 | Disposition: A | Discharge status: Home / Self Care | Payer: Medicare Other | Source: Ambulatory Visit | Attending: General Surgery | Admitting: General Surgery

## 2020-06-20 ENCOUNTER — Encounter: Payer: Self-pay | Admitting: *Deleted

## 2020-06-20 ENCOUNTER — Other Ambulatory Visit: Payer: Self-pay

## 2020-06-20 DIAGNOSIS — Z853 Personal history of malignant neoplasm of breast: Secondary | ICD-10-CM

## 2020-06-20 DIAGNOSIS — R921 Mammographic calcification found on diagnostic imaging of breast: Secondary | ICD-10-CM | POA: Diagnosis not present

## 2020-06-20 DIAGNOSIS — D0511 Intraductal carcinoma in situ of right breast: Secondary | ICD-10-CM

## 2020-06-20 DIAGNOSIS — R922 Inconclusive mammogram: Secondary | ICD-10-CM | POA: Diagnosis not present

## 2020-06-20 HISTORY — DX: Personal history of irradiation: Z92.3

## 2020-06-25 ENCOUNTER — Ambulatory Visit: Payer: Medicare Other | Admitting: Diagnostic Neuroimaging

## 2020-09-03 DIAGNOSIS — R5383 Other fatigue: Secondary | ICD-10-CM | POA: Diagnosis not present

## 2020-09-03 DIAGNOSIS — Z0001 Encounter for general adult medical examination with abnormal findings: Secondary | ICD-10-CM | POA: Diagnosis not present

## 2020-09-03 DIAGNOSIS — E782 Mixed hyperlipidemia: Secondary | ICD-10-CM | POA: Diagnosis not present

## 2020-09-03 DIAGNOSIS — E1165 Type 2 diabetes mellitus with hyperglycemia: Secondary | ICD-10-CM | POA: Diagnosis not present

## 2020-09-03 DIAGNOSIS — E7849 Other hyperlipidemia: Secondary | ICD-10-CM | POA: Diagnosis not present

## 2020-09-05 DIAGNOSIS — M1810 Unilateral primary osteoarthritis of first carpometacarpal joint, unspecified hand: Secondary | ICD-10-CM | POA: Diagnosis not present

## 2020-09-05 DIAGNOSIS — C50919 Malignant neoplasm of unspecified site of unspecified female breast: Secondary | ICD-10-CM | POA: Diagnosis not present

## 2020-09-05 DIAGNOSIS — I10 Essential (primary) hypertension: Secondary | ICD-10-CM | POA: Diagnosis not present

## 2020-09-05 DIAGNOSIS — E782 Mixed hyperlipidemia: Secondary | ICD-10-CM | POA: Diagnosis not present

## 2020-09-05 DIAGNOSIS — E1165 Type 2 diabetes mellitus with hyperglycemia: Secondary | ICD-10-CM | POA: Diagnosis not present

## 2020-12-04 DIAGNOSIS — Z923 Personal history of irradiation: Secondary | ICD-10-CM | POA: Diagnosis not present

## 2020-12-04 DIAGNOSIS — Z9011 Acquired absence of right breast and nipple: Secondary | ICD-10-CM | POA: Diagnosis not present

## 2020-12-04 DIAGNOSIS — F32A Depression, unspecified: Secondary | ICD-10-CM | POA: Diagnosis not present

## 2020-12-04 DIAGNOSIS — D0511 Intraductal carcinoma in situ of right breast: Secondary | ICD-10-CM | POA: Diagnosis not present

## 2020-12-13 ENCOUNTER — Telehealth: Payer: Self-pay | Admitting: Hematology and Oncology

## 2020-12-13 NOTE — Telephone Encounter (Signed)
Per 9/28 schedule msg, pt was called and confirmed appt.

## 2020-12-27 DIAGNOSIS — E1165 Type 2 diabetes mellitus with hyperglycemia: Secondary | ICD-10-CM | POA: Diagnosis not present

## 2020-12-27 DIAGNOSIS — C50919 Malignant neoplasm of unspecified site of unspecified female breast: Secondary | ICD-10-CM | POA: Diagnosis not present

## 2020-12-27 DIAGNOSIS — I10 Essential (primary) hypertension: Secondary | ICD-10-CM | POA: Diagnosis not present

## 2020-12-27 DIAGNOSIS — R739 Hyperglycemia, unspecified: Secondary | ICD-10-CM | POA: Diagnosis not present

## 2020-12-27 DIAGNOSIS — E7849 Other hyperlipidemia: Secondary | ICD-10-CM | POA: Diagnosis not present

## 2020-12-27 DIAGNOSIS — E782 Mixed hyperlipidemia: Secondary | ICD-10-CM | POA: Diagnosis not present

## 2021-01-03 DIAGNOSIS — E1165 Type 2 diabetes mellitus with hyperglycemia: Secondary | ICD-10-CM | POA: Diagnosis not present

## 2021-01-03 DIAGNOSIS — I10 Essential (primary) hypertension: Secondary | ICD-10-CM | POA: Diagnosis not present

## 2021-01-03 DIAGNOSIS — M1810 Unilateral primary osteoarthritis of first carpometacarpal joint, unspecified hand: Secondary | ICD-10-CM | POA: Diagnosis not present

## 2021-01-03 DIAGNOSIS — C50919 Malignant neoplasm of unspecified site of unspecified female breast: Secondary | ICD-10-CM | POA: Diagnosis not present

## 2021-01-03 DIAGNOSIS — E7849 Other hyperlipidemia: Secondary | ICD-10-CM | POA: Diagnosis not present

## 2021-01-14 DIAGNOSIS — I10 Essential (primary) hypertension: Secondary | ICD-10-CM | POA: Diagnosis not present

## 2021-01-14 DIAGNOSIS — E1165 Type 2 diabetes mellitus with hyperglycemia: Secondary | ICD-10-CM | POA: Diagnosis not present

## 2021-01-14 DIAGNOSIS — E782 Mixed hyperlipidemia: Secondary | ICD-10-CM | POA: Diagnosis not present

## 2021-01-17 ENCOUNTER — Other Ambulatory Visit: Payer: Self-pay | Admitting: Radiation Oncology

## 2021-01-17 DIAGNOSIS — D0511 Intraductal carcinoma in situ of right breast: Secondary | ICD-10-CM

## 2021-01-21 ENCOUNTER — Ambulatory Visit
Admission: RE | Admit: 2021-01-21 | Discharge: 2021-01-21 | Disposition: A | Discharge status: Home / Self Care | Payer: Medicare Other | Source: Ambulatory Visit | Attending: Radiation Oncology | Admitting: Radiation Oncology

## 2021-01-21 ENCOUNTER — Other Ambulatory Visit: Payer: Self-pay | Admitting: Radiation Oncology

## 2021-01-21 ENCOUNTER — Other Ambulatory Visit: Payer: Self-pay

## 2021-01-21 DIAGNOSIS — N6489 Other specified disorders of breast: Secondary | ICD-10-CM | POA: Diagnosis not present

## 2021-01-21 DIAGNOSIS — Z803 Family history of malignant neoplasm of breast: Secondary | ICD-10-CM | POA: Diagnosis not present

## 2021-01-21 DIAGNOSIS — Z853 Personal history of malignant neoplasm of breast: Secondary | ICD-10-CM | POA: Diagnosis not present

## 2021-01-21 DIAGNOSIS — D0511 Intraductal carcinoma in situ of right breast: Secondary | ICD-10-CM

## 2021-01-21 DIAGNOSIS — R922 Inconclusive mammogram: Secondary | ICD-10-CM | POA: Diagnosis not present

## 2021-01-29 ENCOUNTER — Ambulatory Visit: Payer: Medicare Other | Admitting: Hematology and Oncology

## 2021-01-31 ENCOUNTER — Other Ambulatory Visit: Payer: Self-pay

## 2021-01-31 ENCOUNTER — Ambulatory Visit
Admission: RE | Admit: 2021-01-31 | Discharge: 2021-01-31 | Disposition: A | Discharge status: Home / Self Care | Payer: Medicare Other | Source: Ambulatory Visit | Attending: Radiation Oncology | Admitting: Radiation Oncology

## 2021-01-31 DIAGNOSIS — D0511 Intraductal carcinoma in situ of right breast: Secondary | ICD-10-CM

## 2021-01-31 DIAGNOSIS — C50212 Malignant neoplasm of upper-inner quadrant of left female breast: Secondary | ICD-10-CM | POA: Diagnosis not present

## 2021-01-31 DIAGNOSIS — R921 Mammographic calcification found on diagnostic imaging of breast: Secondary | ICD-10-CM | POA: Diagnosis not present

## 2021-02-05 ENCOUNTER — Telehealth: Payer: Self-pay | Admitting: Hematology and Oncology

## 2021-02-05 NOTE — Telephone Encounter (Signed)
Scheduled per sch msg. Called and left msg  

## 2021-02-12 DIAGNOSIS — C50212 Malignant neoplasm of upper-inner quadrant of left female breast: Secondary | ICD-10-CM | POA: Diagnosis not present

## 2021-02-12 DIAGNOSIS — Z170 Estrogen receptor positive status [ER+]: Secondary | ICD-10-CM | POA: Diagnosis not present

## 2021-02-13 ENCOUNTER — Other Ambulatory Visit: Payer: Self-pay | Admitting: *Deleted

## 2021-02-13 ENCOUNTER — Telehealth: Payer: Self-pay | Admitting: *Deleted

## 2021-02-13 ENCOUNTER — Ambulatory Visit: Payer: Medicare Other | Admitting: Hematology and Oncology

## 2021-02-13 ENCOUNTER — Ambulatory Visit: Payer: Self-pay | Admitting: General Surgery

## 2021-02-13 DIAGNOSIS — C50912 Malignant neoplasm of unspecified site of left female breast: Secondary | ICD-10-CM

## 2021-02-13 DIAGNOSIS — C50412 Malignant neoplasm of upper-outer quadrant of left female breast: Secondary | ICD-10-CM

## 2021-02-13 DIAGNOSIS — E782 Mixed hyperlipidemia: Secondary | ICD-10-CM | POA: Diagnosis not present

## 2021-02-13 DIAGNOSIS — E1165 Type 2 diabetes mellitus with hyperglycemia: Secondary | ICD-10-CM | POA: Diagnosis not present

## 2021-02-13 DIAGNOSIS — I10 Essential (primary) hypertension: Secondary | ICD-10-CM | POA: Diagnosis not present

## 2021-02-13 NOTE — Telephone Encounter (Signed)
Received call from pt stating she does not wish to undergo radiation therapy with [facility].  Patient states she lives in [place] and would prefer to receive radiation therapy only with Dr. Lynnette Caffey with [place] considering it is closer to home.  RN sent in basket to MD and nurse navigators to facilitate.

## 2021-02-13 NOTE — Telephone Encounter (Signed)
Received call from pt stating after much consideration she would prefer to transfer all Oncology care to Dr. Lynnette Caffey with [facility] in [place].  RN will alert MD and Navigators of pts decision to send referral.

## 2021-02-14 ENCOUNTER — Telehealth: Payer: Self-pay | Admitting: *Deleted

## 2021-02-14 ENCOUNTER — Encounter: Payer: Self-pay | Admitting: *Deleted

## 2021-02-14 ENCOUNTER — Other Ambulatory Visit: Payer: Self-pay | Admitting: General Surgery

## 2021-02-14 DIAGNOSIS — C50412 Malignant neoplasm of upper-outer quadrant of left female breast: Secondary | ICD-10-CM

## 2021-02-14 NOTE — Telephone Encounter (Signed)
Received message that patient would like to have all her care at [facility].  I have sent a referral for Dr. Lynnette Caffey. Spoke with her daughter Sondra Barges and asked about med onc follow up and she states she has only seen Dr. Lynnette Caffey in the past and only wanted to see him for now.  I informed her that after xrt if Dr. Lynnette Caffey wants her to see med onc he could place a referral for at that time.

## 2021-02-17 NOTE — Assessment & Plan Note (Deleted)
12/27/2018:Patient noted bilateral dark red spontaneous nipple discharge and a left breast lump palpable on exam. Mammogram showed calcifications in the right breast spanning 3.45mm and no correlate for the previously palpable left breast lump. Breast MRI showed no evidence of malignancy bilaterally. Right breast biopsy showed intermediate grade DCIS, ER+ 100%, PR+ 70%.  04/28/2019: Right lumpectomy [name]): intermediate grade DCIS, clear margins ER 100%, PR 70% [facility]  Plan: Adjuvant antiestrogen therapy with tamoxifen 20 mg daily x5 years Tamoxifen Toxicities:   Breast Cancer Surveillance: 1. Mammogram: 07/10/20: Benign, Density Cat B 2. Breast Exam: 02/17/21: Benign  RTC in 1 year

## 2021-02-17 NOTE — Progress Notes (Incomplete)
Patient Care Team: Sasser, Silvestre Moment, MD as PCP - General (Family Medicine) Burnell Blanks, MD as PCP - Cardiology (Cardiology) Nicholas Lose, MD as Consulting Physician (Hematology and Oncology) Jovita Kussmaul, MD as Consulting Physician (General Surgery) Mauro Kaufmann, RN as Oncology Nurse Navigator Rockwell Germany, RN as Oncology Nurse Navigator  DIAGNOSIS: No diagnosis found.  SUMMARY OF ONCOLOGIC HISTORY: Oncology History  Ductal carcinoma in situ (DCIS) of right breast  12/27/2018 Initial Diagnosis   Patient noted bilateral dark red spontaneous nipple discharge and a left breast lump palpable on exam. Mammogram showed calcifications in the right breast spanning 3.15m and no correlate for the previously palpable left breast lump. Breast MRI showed no evidence of malignancy bilaterally. Right breast biopsy showed intermediate grade DCIS, ER+ 100%, PR+ 70%.    03/01/2019 Genetic Testing   Negative genetic testing:  No pathogenic variants detected on the [facility] Breast Cancer STAT panel or the [facility] Multi-Cancer panel. A variant of uncertain significance was detected in the APC gene called c.449A>G. The report date is 03/01/2019.  The Breast Cancer STAT panel offered by [facility] includes sequencing and rearrangement analysis for the following 9 genes:  ATM, BRCA1, BRCA2, CDH1, CHEK2, PALB2, PTEN, STK11 and TP53.  The Multi-Cancer Panel offered by [facility] includes sequencing and/or deletion duplication testing of the following 85 genes: AIP, ALK, APC, ATM, AXIN2,BAP1,  BARD1, BLM, BMPR1A, BRCA1, BRCA2, BRIP1, CASR, CDC73, CDH1, CDK4, CDKN1B, CDKN1C, CDKN2A (p14ARF), CDKN2A (p16INK4a), CEBPA, CHEK2, CTNNA1, DICER1, DIS3L2, EGFR (c.2369C>T, p.Thr790Met variant only), EPCAM (Deletion/duplication testing only), FH, FLCN, GATA2, GPC3, GREM1 (Promoter region deletion/duplication testing only), HOXB13 (c.251G>A, p.Gly84Glu), HRAS, KIT, MAX, MEN1, MET, MITF (c.952G>A, p.Glu318Lys variant  only), MLH1, MSH2, MSH3, MSH6, MUTYH, NBN, NF1, NF2, NTHL1, PALB2, PDGFRA, PHOX2B, PMS2, POLD1, POLE, POT1, PRKAR1A, PTCH1, PTEN, RAD50, RAD51C, RAD51D, RB1, RECQL4, RET, RNF43, RUNX1, SDHAF2, SDHA (sequence changes only), SDHB, SDHC, SDHD, SMAD4, SMARCA4, SMARCB1, SMARCE1, STK11, SUFU, TERC, TERT, TMEM127, TP53, TSC1, TSC2, VHL, WRN and WT1.     04/28/2019 Cancer Staging   Staging form: Breast, AJCC 8th Edition - Pathologic stage from 04/28/2019: Stage 0 (pTis (DCIS), pN0, cM0, ER+, PR+)   04/28/2019 Surgery   Right lumpectomy (Miranda Fuller [phone]: intermediate grade DCIS, clear margins. No regional lymph nodes were examined.   06/16/2019 - 07/11/2019 Radiation Therapy   [facility]   07/2019 - 07/2024 Anti-estrogen oral therapy   Tamoxifen     CHIEF COMPLIANT: Follow-up of right breast DCIS  INTERVAL HISTORY: Miranda CONNELLEYis a 70y.o. with above-mentioned history of right breast DCIS having undergone right lumpectomy and radiation. Mammogram on 01/21/2021 showed no evidence of indeterminate small asymmetry central calcifications in the upper inner left breast. Biopsy on 01/31/2021 showed invasive ductal carcinoma with extracellular mucin and DCIS with calcifications, ER/PR+(100%), Her2-. She presents to the clinic today for follow-up.   ALLERGIES:  is allergic to oxycodone-acetaminophen, propoxyphene, demerol [meperidine], morphine and related, oxycodone-aspirin, and chlorhexidine.  MEDICATIONS:  Current Outpatient Medications  Medication Sig Dispense Refill   aspirin 81 MG tablet Take 1 tablet by mouth daily.     Calcium Carbonate-Vit D-Min (CALCIUM 1200 PO) Take 1,200 mg by mouth 2 (two) times daily.     Cholecalciferol (VITAMIN D-3) 25 MCG (1000 UT) CAPS Take 3 capsules by mouth daily.     insulin aspart (NOVOLOG) 100 UNIT/ML injection Inject 5-15 Units into the skin 3 (three) times daily with meals. 5-15 units on sliding scale  insulin glargine (LANTUS) 100 UNIT/ML injection  Inject 60 Units into the skin at bedtime.      lisinopril-hydrochlorothiazide (ZESTORETIC) 10-12.5 MG tablet Take 1 tablet by mouth daily.     metFORMIN (GLUCOPHAGE-XR) 500 MG 24 hr tablet Take 500 mg by mouth daily with breakfast.     Multiple Vitamins-Minerals (MULTIVITAMIN ADULT PO) Take by mouth daily.     Pediatric Multivitamins-Iron Miranda Fuller PO) Take 2 tablets by mouth daily.     tamoxifen (NOLVADEX) 20 MG tablet Take 1 tablet (20 mg total) by mouth daily. 90 tablet 3   traMADol (ULTRAM) 50 MG tablet Take 1-2 tablets (50-100 mg total) by mouth every 6 (six) hours as needed. 20 tablet 0   venlafaxine XR (EFFEXOR-XR) 75 MG 24 hr capsule Take 75 mg by mouth daily.     vitamin B-12 (CYANOCOBALAMIN) 500 MCG tablet Take 1,000 mcg by mouth 2 (two) times daily.     No current facility-administered medications for this visit.    PHYSICAL EXAMINATION: ECOG PERFORMANCE STATUS: {CHL ONC ECOG PS:[phone]}  There were no vitals filed for this visit. There were no vitals filed for this visit.  BREAST:*** No palpable masses or nodules in either right or left breasts. No palpable axillary supraclavicular or infraclavicular adenopathy no breast tenderness or nipple discharge. (exam performed in the presence of a chaperone)  LABORATORY DATA:  I have reviewed the data as listed CMP Latest Ref Rng & Units 04/25/2019 02/22/2019 01/31/2019  Glucose 70 - 99 mg/dL 148(H) 219(H) 196(H)  BUN 8 - 23 mg/dL _0 Creatinine 0.44 - 1.00 mg/dL 0.76 0.80 0.70  Sodium 135 - 145 mmol/L 137 138 137  Potassium 3.5 - 5.1 mmol/L 4.3 4.3 4.0  Chloride 98 - 111 mmol/L 105 103 102  CO2 22 - 32 mmol/L _1 Calcium 8.9 - 10.3 mg/dL 9.0 9.0 9.2  Total Protein 6.5 - 8.1 g/dL - 7.2 6.6  Total Bilirubin 0.3 - 1.2 mg/dL - 0.5 0.7  Alkaline Phos 38 - 126 U/L - 69 -  AST 15 - 41 U/L - 13(L) 13  ALT 0 - 44 U/L - 12 12    Lab Results  Component Value Date   WBC 7.7 02/22/2019   HGB 14.5 02/22/2019    HCT 43.4 02/22/2019   MCV 94.6 02/22/2019   PLT 246 02/22/2019   NEUTROABS 4.1 02/22/2019    ASSESSMENT & PLAN:  No problem-specific Assessment & Plan notes found for this encounter.    No orders of the defined types were placed in this encounter.  The patient has a good understanding of the overall plan. she agrees with it. she will call with any problems that may develop before the next visit here.  Total time spent: *** mins including face to face time and time spent for planning, charting and coordination of care  Rulon Eisenmenger, MD, MPH 02/17/2021  I, Thana Ates, am acting as scribe for Dr. Nicholas Lose.  {insert scribe attestation}

## 2021-02-18 ENCOUNTER — Ambulatory Visit: Payer: Medicare Other | Admitting: Hematology and Oncology

## 2021-03-04 ENCOUNTER — Ambulatory Visit: Payer: Medicare Other | Attending: General Surgery | Admitting: Rehabilitation

## 2021-03-04 ENCOUNTER — Encounter: Payer: Self-pay | Admitting: Rehabilitation

## 2021-03-04 ENCOUNTER — Other Ambulatory Visit: Payer: Self-pay

## 2021-03-04 DIAGNOSIS — R293 Abnormal posture: Secondary | ICD-10-CM | POA: Insufficient documentation

## 2021-03-04 DIAGNOSIS — C50912 Malignant neoplasm of unspecified site of left female breast: Secondary | ICD-10-CM | POA: Insufficient documentation

## 2021-03-04 NOTE — Therapy (Signed)
[facility] @ [address] [place], [place], [address] Phone: [phone]   Fax:  [phone]  Physical Therapy Evaluation  Patient Details  Name: Miranda Fuller MRN: [id number] Date of Birth: February 20, 1951 Referring Provider (PT): Dr. [name]   Encounter Date: 03/04/2021   PT End of Session - 03/04/21 1038     Visit Number 1    Number of Visits 2    Date for PT Re-Evaluation 04/15/21    PT Start Time 1004    PT Stop Time 1030    PT Time Calculation (min) 26 min    Activity Tolerance Patient tolerated treatment well    Behavior During Therapy [facility] for tasks assessed/performed             Past Medical History:  Diagnosis Date   Breast mass 12/27/2018   right- cancer, s/p lumpectomy, radiation   Coccidioidomycosis, pulmonary ([place])    Complication of anesthesia    DDD (degenerative disc disease), cervical    Dementia ([place])    Early per Dr. Luan Pulling   Depression 2009   DM (diabetes mellitus screen) 2009   Family history of bone cancer    Family history of brain cancer    Family history of breast cancer    Family history of esophageal cancer    Family history of lung cancer    Glaucoma    HTN (hypertension)    Morbid obesity ([place])    Nodule of upper lobe of left lung 12/27/2018   Personal history of radiation therapy    PONV (postoperative nausea and vomiting)    Sleep apnea    had surgery   Type II diabetes mellitus, uncontrolled 01/31/2019    Past Surgical History:  Procedure Laterality Date   ABDOMINAL HYSTERECTOMY     TAH BSO   BREAST BIOPSY Right 02/07/2019   BREAST LUMPECTOMY Right 04/28/2019   BREAST LUMPECTOMY WITH RADIOACTIVE SEED LOCALIZATION Right 04/28/2019   Procedure: RIGHT BREAST LUMPECTOMY WITH RADIOACTIVE SEED LOCALIZATION;  Surgeon: Jovita Kussmaul, MD;  Location: [facility];  Service: General;  Laterality: Right;   CHOLECYSTECTOMY     TUBAL LIGATION      UVULOPALATOPHARYNGOPLASTY      There were no vitals filed for this visit.    Subjective Assessment - 03/04/21 1006     Subjective I am doing okay    Pertinent History Pt will be having Lt lumpectomy 03/22/21 with Dr. [name]. Rt lumpectomy 2 years ago but no lymph nodes removed.  Will have radiation and chemotherapy is pending.    Limitations Walking   hip pain   Patient Stated Goals get information from providers    Currently in Pain? No/denies                [facility] PT Assessment - 03/04/21 0001       Assessment   Medical Diagnosis Lt breast cancer    Referring Provider (PT) Dr. [name]    Onset Date/Surgical Date 03/04/21    Hand Dominance Right      Precautions   Precaution Comments active cancer      Restrictions   Weight Bearing Restrictions No      Balance Screen   Has the patient fallen in the past 6 months No    Has the patient had a decrease in activity level because of a fear of falling?  No    Is the patient reluctant to leave their home because of a  fear of falling?  No      Home Environment   Living Environment Private residence    Living Arrangements Alone    Available Help at Discharge Family      Prior Function   Level of [place] Retired    Leisure daily walking but not for exercise      ROM / Strength   AROM / PROM / Strength AROM      AROM   AROM Assessment Site Shoulder    Right/Left Shoulder Left    Left Shoulder Extension 55 Degrees    Left Shoulder Flexion 145 Degrees    Left Shoulder ABduction 170 Degrees    Left Shoulder External Rotation 82 Degrees               LYMPHEDEMA/ONCOLOGY QUESTIONNAIRE - 03/04/21 0001       Type   Cancer Type Lt breast - Hx of Rt breast      Surgeries   Lumpectomy Date 03/22/21   04/17/18 Rt     Treatment   Active Chemotherapy Treatment No    Past Chemotherapy Treatment No    Active Radiation Treatment No    Past Radiation Treatment Yes    Body Site Rt breast       Lymphedema Assessments   Lymphedema Assessments Upper extremities      Right Upper Extremity Lymphedema   At Axilla  38.5 cm    Olecranon Process 28 cm    Just Proximal to Ulnar Styloid Process 16.7 cm      Left Upper Extremity Lymphedema   At Axilla  38.5 cm    Olecranon Process 27.5 cm    Just Proximal to Ulnar Styloid Process 17 cm             L-DEX FLOWSHEETS - 03/04/21 1000       L-DEX LYMPHEDEMA SCREENING   Measurement Type Unilateral    L-DEX MEASUREMENT EXTREMITY Upper Extremity    POSITION  Standing    DOMINANT SIDE Right    At Risk Side Left    BASELINE SCORE (UNILATERAL) 1.3                  Quick Dash - 03/04/21 0001     Open a tight or new jar Mild difficulty    Do heavy household chores (wash walls, wash floors) Mild difficulty    Carry a shopping bag or briefcase No difficulty    Wash your back No difficulty    Use a knife to cut food No difficulty    Recreational activities in which you take some force or impact through your arm, shoulder, or hand (golf, hammering, tennis) No difficulty    During the past week, to what extent has your arm, shoulder or hand problem interfered with your normal social activities with family, friends, neighbors, or groups? Modererately    During the past week, to what extent has your arm, shoulder or hand problem limited your work or other regular daily activities Slightly    Arm, shoulder, or hand pain. Severe    Tingling (pins and needles) in your arm, shoulder, or hand None    Difficulty Sleeping Moderate difficulty    DASH Score 22.73 %              Objective measurements completed on examination: See above findings.  PT Long Term Goals - 03/04/21 1043       PT LONG TERM GOAL #1   Title Pt will return to baseline AROM    Time 6    Period Weeks    Status New             Breast Clinic Goals - 03/04/21 1043       Patient will be able to verbalize  understanding of pertinent lymphedema risk reduction practices relevant to her diagnosis specifically related to skin care.   Status Achieved      Patient will be able to return demonstrate and/or verbalize understanding of the post-op home exercise program related to regaining shoulder range of motion.   Status Achieved      Patient will be able to verbalize understanding of the importance of attending the postoperative After Breast Cancer Class for further lymphedema risk reduction education and therapeutic exercise.   Status Achieved                   Plan - 03/04/21 1039     Clinical Impression Statement Pt presents pre Lt lumpectomy for baseline ROM, SOZO score, and circumferential measurements.  Pt was educated on post op stretches, briefly on lymphedema and was told to chose the Rt side for blood work and BP as pt has had a lumpectomy on the Rt but no LN removal.  Pt has some limited mobility due to hip and hand pain and is mostly sedentary, so pt was also educated on importance of walking during treatment as able.    Personal Factors and Comorbidities Age;Fitness    Examination-Activity Limitations Locomotion Level    Examination-Participation Restrictions Cleaning;Yard Work    Stability/Clinical Decision Making Stable/Uncomplicated    Designer, jewellery Low    Rehab Potential Excellent    PT Frequency --   post op visit with more as needed   PT Duration 6 weeks    PT Treatment/Interventions ADLs/Self Care Home Management;Therapeutic exercise;Manual lymph drainage;Patient/family education;Manual techniques    PT Next Visit Plan post op check - discuss ABC class info in person as pt does not have computer - scheduled SOZO follow up    Consulted and Agree with Plan of Care Patient;Family member/caregiver             Patient will benefit from skilled therapeutic intervention in order to improve the following deficits and impairments:  Decreased safety awareness,  Postural dysfunction  Visit Diagnosis: Abnormal posture  Primary malignant neoplasm of left breast [facility])     Problem List Patient Active Problem List   Diagnosis Date Noted   DOE (dyspnea on exertion) 03/14/2019   Genetic testing 03/01/2019   Family history of breast cancer    Family history of esophageal cancer    Family history of brain cancer    Family history of lung cancer    Family history of bone cancer    pulmonary nodule 02/17/2019   Family history of cancer 02/17/2019   Breast discharge 02/17/2019   Type II diabetes mellitus, uncontrolled 01/31/2019   Ductal carcinoma in situ (DCIS) of right breast 12/27/2018   Nodule of upper lobe of left lung 12/27/2018   Obstructive sleep apnea syndrome 11/28/2015   Essential hypertension 11/28/2015   Precordial chest pain 11/24/2014   Morbid obesity ([place]) 11/24/2014    Stark Bray, PT 03/04/2021, 10:44 AM  [facility] @ [place], [place], [address] Phone: [phone]  Fax:  [phone]  Name: SILENA WYSS MRN: [id number] Date of Birth: Jun 06, 1950

## 2021-03-04 NOTE — Patient Instructions (Signed)
Physical Therapy Information for After Breast Cancer Surgery/Treatment:  Lymphedema is a swelling condition that you may be at risk for in your arm if you have lymph nodes removed from the armpit area.  After a sentinel node biopsy, the risk is approximately 5-9% and is higher after an axillary node dissection.  There is treatment available for this condition and it is not life-threatening.  Contact your physician or physical therapist with concerns. You may begin the 4 shoulder/posture exercises (see additional sheet) when permitted by your physician (typically a week after surgery).  If you have drains, you may need to wait until those are removed before beginning range of motion exercises.  A general recommendation is to not lift your arms above shoulder height until drains are removed.  These exercises should be done to your tolerance and gently.  This is not a "no pain/no gain" type of recovery so listen to your body and stretch into the range of motion that you can tolerate, stopping if you have pain.  If you are having immediate reconstruction, ask your plastic surgeon about doing exercises as he or she may want you to wait. We encourage you to attend the free one time ABC (After Breast Cancer) class offered by [facility].  You will learn information related to lymphedema risk, prevention and treatment and additional exercises to regain mobility following surgery.  You can call [phone] for more information.  This is offered the 1st and 3rd Monday of each month.  You only attend the class one time. While undergoing any medical procedure or treatment, try to avoid blood pressure being taken or needle sticks from occurring on the arm on the side of cancer.   This recommendation begins after surgery and continues for the rest of your life.  This may help reduce your risk of getting lymphedema (swelling in your arm). An excellent resource for those seeking information on  lymphedema is the National Lymphedema Network's web site. It can be accessed at [facility] If you notice swelling in your hand, arm or breast at any time following surgery (even if it is many years from now), please contact your doctor or physical therapist to discuss this.  Lymphedema can be treated at any time but it is easier for you if it is treated early on.  If you feel like your shoulder motion is not returning to normal in a reasonable amount of time, please contact your surgeon or physical therapist.   Patient was instructed today in a home exercise program today for post op shoulder range of motion. These included active assist shoulder flexion in sitting, scapular retraction, wall walking with shoulder abduction, and hands behind head external rotation.  She was encouraged to do these twice a day, holding 3 seconds and repeating 5 times when permitted by her physician.

## 2021-03-08 ENCOUNTER — Other Ambulatory Visit: Payer: Self-pay

## 2021-03-08 ENCOUNTER — Encounter (HOSPITAL_BASED_OUTPATIENT_CLINIC_OR_DEPARTMENT_OTHER): Payer: Self-pay | Admitting: General Surgery

## 2021-03-15 ENCOUNTER — Other Ambulatory Visit: Payer: Self-pay

## 2021-03-15 ENCOUNTER — Encounter (HOSPITAL_BASED_OUTPATIENT_CLINIC_OR_DEPARTMENT_OTHER)
Admission: RE | Admit: 2021-03-15 | Discharge: 2021-03-15 | Disposition: A | Discharge status: Home / Self Care | Payer: Medicare Other | Source: Ambulatory Visit | Attending: General Surgery | Admitting: General Surgery

## 2021-03-15 DIAGNOSIS — Z01818 Encounter for other preprocedural examination: Secondary | ICD-10-CM | POA: Diagnosis not present

## 2021-03-15 DIAGNOSIS — E1165 Type 2 diabetes mellitus with hyperglycemia: Secondary | ICD-10-CM | POA: Diagnosis not present

## 2021-03-15 DIAGNOSIS — E782 Mixed hyperlipidemia: Secondary | ICD-10-CM | POA: Diagnosis not present

## 2021-03-15 DIAGNOSIS — I10 Essential (primary) hypertension: Secondary | ICD-10-CM | POA: Diagnosis not present

## 2021-03-15 LAB — BASIC METABOLIC PANEL
Anion gap: 9 (ref 5–15)
BUN: 16 mg/dL (ref 8–23)
CO2: 27 mmol/L (ref 22–32)
Calcium: 9 mg/dL (ref 8.9–10.3)
Chloride: 101 mmol/L (ref 98–111)
Creatinine, Ser: 0.74 mg/dL (ref 0.44–1.00)
GFR, Estimated: >60 mL/min (ref >60)
Glucose, Bld: 192 mg/dL — ABNORMAL HIGH (ref 70–99)
Potassium: 4.3 mmol/L (ref 3.5–5.1)
Sodium: 137 mmol/L (ref 135–145)

## 2021-03-15 NOTE — Progress Notes (Signed)
Lab work and EKG performed

## 2021-03-21 ENCOUNTER — Ambulatory Visit
Admission: RE | Admit: 2021-03-21 | Discharge: 2021-03-21 | Disposition: A | Discharge status: Home / Self Care | Payer: Medicare Other | Source: Ambulatory Visit | Attending: General Surgery | Admitting: General Surgery

## 2021-03-21 DIAGNOSIS — C50412 Malignant neoplasm of upper-outer quadrant of left female breast: Secondary | ICD-10-CM

## 2021-03-21 DIAGNOSIS — Z170 Estrogen receptor positive status [ER+]: Secondary | ICD-10-CM

## 2021-03-21 DIAGNOSIS — R928 Other abnormal and inconclusive findings on diagnostic imaging of breast: Secondary | ICD-10-CM | POA: Diagnosis not present

## 2021-03-22 ENCOUNTER — Ambulatory Visit (HOSPITAL_BASED_OUTPATIENT_CLINIC_OR_DEPARTMENT_OTHER)
Admission: RE | Admit: 2021-03-22 | Discharge: 2021-03-22 | Disposition: A | Discharge status: Home / Self Care | Payer: Medicare Other | Source: Ambulatory Visit | Attending: General Surgery | Admitting: General Surgery

## 2021-03-22 ENCOUNTER — Other Ambulatory Visit: Payer: Self-pay

## 2021-03-22 ENCOUNTER — Ambulatory Visit
Admission: RE | Admit: 2021-03-22 | Discharge: 2021-03-22 | Disposition: A | Discharge status: Home / Self Care | Payer: Medicare Other | Source: Ambulatory Visit | Attending: General Surgery | Admitting: General Surgery

## 2021-03-22 ENCOUNTER — Ambulatory Visit (HOSPITAL_BASED_OUTPATIENT_CLINIC_OR_DEPARTMENT_OTHER): Payer: Medicare Other | Admitting: Certified Registered"

## 2021-03-22 ENCOUNTER — Encounter (HOSPITAL_BASED_OUTPATIENT_CLINIC_OR_DEPARTMENT_OTHER)
Admission: RE | Disposition: A | Discharge status: Home / Self Care | Payer: Self-pay | Source: Ambulatory Visit | Attending: General Surgery

## 2021-03-22 ENCOUNTER — Encounter (HOSPITAL_BASED_OUTPATIENT_CLINIC_OR_DEPARTMENT_OTHER): Payer: Self-pay | Admitting: General Surgery

## 2021-03-22 DIAGNOSIS — D0512 Intraductal carcinoma in situ of left breast: Secondary | ICD-10-CM | POA: Diagnosis not present

## 2021-03-22 DIAGNOSIS — G8918 Other acute postprocedural pain: Secondary | ICD-10-CM | POA: Diagnosis not present

## 2021-03-22 DIAGNOSIS — Z7984 Long term (current) use of oral hypoglycemic drugs: Secondary | ICD-10-CM | POA: Insufficient documentation

## 2021-03-22 DIAGNOSIS — E119 Type 2 diabetes mellitus without complications: Secondary | ICD-10-CM | POA: Diagnosis not present

## 2021-03-22 DIAGNOSIS — I10 Essential (primary) hypertension: Secondary | ICD-10-CM

## 2021-03-22 DIAGNOSIS — Z79899 Other long term (current) drug therapy: Secondary | ICD-10-CM | POA: Insufficient documentation

## 2021-03-22 DIAGNOSIS — Z170 Estrogen receptor positive status [ER+]: Secondary | ICD-10-CM | POA: Insufficient documentation

## 2021-03-22 DIAGNOSIS — E785 Hyperlipidemia, unspecified: Secondary | ICD-10-CM | POA: Insufficient documentation

## 2021-03-22 DIAGNOSIS — C50212 Malignant neoplasm of upper-inner quadrant of left female breast: Secondary | ICD-10-CM | POA: Diagnosis not present

## 2021-03-22 DIAGNOSIS — C50912 Malignant neoplasm of unspecified site of left female breast: Secondary | ICD-10-CM | POA: Diagnosis not present

## 2021-03-22 DIAGNOSIS — C50412 Malignant neoplasm of upper-outer quadrant of left female breast: Secondary | ICD-10-CM

## 2021-03-22 HISTORY — PX: BREAST LUMPECTOMY WITH RADIOACTIVE SEED AND SENTINEL LYMPH NODE BIOPSY: SHX6550

## 2021-03-22 LAB — GLUCOSE, CAPILLARY
Glucose-Capillary: 265 mg/dL — ABNORMAL HIGH (ref 70–99)
Glucose-Capillary: 219 mg/dL — ABNORMAL HIGH (ref 70–99)
Glucose-Capillary: 192 mg/dL — ABNORMAL HIGH (ref 70–99)

## 2021-03-22 SURGERY — BREAST LUMPECTOMY WITH RADIOACTIVE SEED AND SENTINEL LYMPH NODE BIOPSY
Anesthesia: General | Site: Breast | Laterality: Left

## 2021-03-22 MED ORDER — 0.9 % SODIUM CHLORIDE (POUR BTL) OPTIME
TOPICAL | Status: DC | PRN
  Administered 2021-03-22: 300 mL

## 2021-03-22 MED ORDER — TRAMADOL HCL 50 MG PO TABS: 50.0000 mg | ORAL_TABLET | Freq: Four times a day (QID) | ORAL | 0 refills | Status: DC | PRN

## 2021-03-22 MED ORDER — EPHEDRINE 5 MG/ML INJ
INTRAVENOUS | Status: AC
  Filled 2021-03-22: qty 5

## 2021-03-22 MED ORDER — PHENYLEPHRINE HCL (PRESSORS) 10 MG/ML IV SOLN
INTRAVENOUS | Status: AC
  Filled 2021-03-22: qty 1

## 2021-03-22 MED ORDER — ROPIVACAINE HCL 5 MG/ML IJ SOLN
INTRAMUSCULAR | Status: DC | PRN
  Administered 2021-03-22: 30 mL

## 2021-03-22 MED ORDER — SUCCINYLCHOLINE CHLORIDE 200 MG/10ML IV SOSY
PREFILLED_SYRINGE | INTRAVENOUS | Status: AC
  Filled 2021-03-22: qty 10

## 2021-03-22 MED ORDER — DROPERIDOL 2.5 MG/ML IJ SOLN
INTRAMUSCULAR | Status: DC | PRN
  Administered 2021-03-22: .625 mg via INTRAVENOUS

## 2021-03-22 MED ORDER — HYDROMORPHONE HCL 1 MG/ML IJ SOLN: 0.2500 mg | INTRAMUSCULAR | Status: DC | PRN

## 2021-03-22 MED ORDER — ACETAMINOPHEN 10 MG/ML IV SOLN: 1000.0000 mg | Freq: Once | INTRAVENOUS | Status: DC | PRN

## 2021-03-22 MED ORDER — FENTANYL CITRATE (PF) 100 MCG/2ML IJ SOLN
75.0000 ug | Freq: Once | INTRAMUSCULAR | Status: AC
  Administered 2021-03-22: 75 ug via INTRAVENOUS

## 2021-03-22 MED ORDER — EPHEDRINE SULFATE 50 MG/ML IJ SOLN
INTRAMUSCULAR | Status: DC | PRN
  Administered 2021-03-22: 15 mg via INTRAVENOUS
  Administered 2021-03-22: 10 mg via INTRAVENOUS

## 2021-03-22 MED ORDER — PHENYLEPHRINE HCL-NACL 20-0.9 MG/250ML-% IV SOLN
INTRAVENOUS | Status: DC | PRN
  Administered 2021-03-22: 50 ug/min via INTRAVENOUS

## 2021-03-22 MED ORDER — INSULIN ASPART 100 UNIT/ML IJ SOLN
8.0000 [IU] | Freq: Once | INTRAMUSCULAR | Status: AC
  Administered 2021-03-22: 8 [IU] via SUBCUTANEOUS

## 2021-03-22 MED ORDER — ONDANSETRON HCL 4 MG/2ML IJ SOLN
INTRAMUSCULAR | Status: DC | PRN
  Administered 2021-03-22: 4 mg via INTRAVENOUS

## 2021-03-22 MED ORDER — PROPOFOL 10 MG/ML IV BOLUS
INTRAVENOUS | Status: DC | PRN
  Administered 2021-03-22: 200 mg via INTRAVENOUS

## 2021-03-22 MED ORDER — BUPIVACAINE-EPINEPHRINE (PF) 0.25% -1:200000 IJ SOLN
INTRAMUSCULAR | Status: AC
  Filled 2021-03-22: qty 30

## 2021-03-22 MED ORDER — CEFAZOLIN SODIUM-DEXTROSE 2-4 GM/100ML-% IV SOLN
INTRAVENOUS | Status: AC
  Filled 2021-03-22: qty 100

## 2021-03-22 MED ORDER — LIDOCAINE 2% (20 MG/ML) 5 ML SYRINGE
INTRAMUSCULAR | Status: AC
  Filled 2021-03-22: qty 5

## 2021-03-22 MED ORDER — CEFAZOLIN SODIUM-DEXTROSE 2-4 GM/100ML-% IV SOLN
2.0000 g | INTRAVENOUS | Status: AC
  Administered 2021-03-22: 2 g via INTRAVENOUS

## 2021-03-22 MED ORDER — GLYCOPYRROLATE 0.2 MG/ML IJ SOLN
INTRAMUSCULAR | Status: DC | PRN
  Administered 2021-03-22: .2 mg via INTRAVENOUS

## 2021-03-22 MED ORDER — CEFAZOLIN SODIUM-DEXTROSE 2-4 GM/100ML-% IV SOLN: 2.0000 g | INTRAVENOUS | Status: DC

## 2021-03-22 MED ORDER — TRAMADOL HCL 50 MG PO TABS
50.0000 mg | ORAL_TABLET | Freq: Once | ORAL | Status: AC
  Administered 2021-03-22: 50 mg via ORAL

## 2021-03-22 MED ORDER — GABAPENTIN 300 MG PO CAPS
ORAL_CAPSULE | ORAL | Status: AC
  Filled 2021-03-22: qty 1

## 2021-03-22 MED ORDER — PROPOFOL 10 MG/ML IV BOLUS
INTRAVENOUS | Status: AC
  Filled 2021-03-22: qty 20

## 2021-03-22 MED ORDER — LACTATED RINGERS IV SOLN: INTRAVENOUS | Status: DC

## 2021-03-22 MED ORDER — MIDAZOLAM HCL 2 MG/2ML IJ SOLN
INTRAMUSCULAR | Status: AC
  Filled 2021-03-22: qty 2

## 2021-03-22 MED ORDER — DEXAMETHASONE SODIUM PHOSPHATE 4 MG/ML IJ SOLN
INTRAMUSCULAR | Status: DC | PRN
  Administered 2021-03-22: 4 mg via INTRAVENOUS

## 2021-03-22 MED ORDER — INSULIN ASPART 100 UNIT/ML IJ SOLN
INTRAMUSCULAR | Status: AC
  Filled 2021-03-22: qty 1

## 2021-03-22 MED ORDER — TRAMADOL HCL 50 MG PO TABS
ORAL_TABLET | ORAL | Status: AC
  Filled 2021-03-22: qty 1

## 2021-03-22 MED ORDER — LIDOCAINE 2% (20 MG/ML) 5 ML SYRINGE
INTRAMUSCULAR | Status: DC | PRN
  Administered 2021-03-22: 100 mg via INTRAVENOUS

## 2021-03-22 MED ORDER — ONDANSETRON HCL 4 MG/2ML IJ SOLN: 4.0000 mg | Freq: Once | INTRAMUSCULAR | Status: DC | PRN

## 2021-03-22 MED ORDER — SUCCINYLCHOLINE CHLORIDE 200 MG/10ML IV SOSY
PREFILLED_SYRINGE | INTRAVENOUS | Status: DC | PRN
  Administered 2021-03-22: 200 mg via INTRAVENOUS

## 2021-03-22 MED ORDER — BUPIVACAINE-EPINEPHRINE 0.25% -1:200000 IJ SOLN
INTRAMUSCULAR | Status: DC | PRN
  Administered 2021-03-22: 20 mL

## 2021-03-22 MED ORDER — FENTANYL CITRATE (PF) 100 MCG/2ML IJ SOLN
INTRAMUSCULAR | Status: DC | PRN
  Administered 2021-03-22: 50 ug via INTRAVENOUS

## 2021-03-22 MED ORDER — GLYCOPYRROLATE PF 0.2 MG/ML IJ SOSY
PREFILLED_SYRINGE | INTRAMUSCULAR | Status: AC
  Filled 2021-03-22: qty 1

## 2021-03-22 MED ORDER — PHENYLEPHRINE 40 MCG/ML (10ML) SYRINGE FOR IV PUSH (FOR BLOOD PRESSURE SUPPORT)
PREFILLED_SYRINGE | INTRAVENOUS | Status: AC
  Filled 2021-03-22: qty 10

## 2021-03-22 MED ORDER — MAGTRACE LYMPHATIC TRACER
INTRAMUSCULAR | Status: DC | PRN
  Administered 2021-03-22: 2 mL via INTRAMUSCULAR

## 2021-03-22 MED ORDER — GABAPENTIN 300 MG PO CAPS
300.0000 mg | ORAL_CAPSULE | ORAL | Status: AC
  Administered 2021-03-22: 300 mg via ORAL

## 2021-03-22 MED ORDER — ONDANSETRON HCL 4 MG/2ML IJ SOLN
INTRAMUSCULAR | Status: AC
  Filled 2021-03-22: qty 2

## 2021-03-22 MED ORDER — DEXAMETHASONE SODIUM PHOSPHATE 10 MG/ML IJ SOLN
INTRAMUSCULAR | Status: AC
  Filled 2021-03-22: qty 1

## 2021-03-22 MED ORDER — DROPERIDOL 2.5 MG/ML IJ SOLN
INTRAMUSCULAR | Status: AC
  Filled 2021-03-22: qty 2

## 2021-03-22 MED ORDER — PHENYLEPHRINE HCL (PRESSORS) 10 MG/ML IV SOLN
INTRAVENOUS | Status: DC | PRN
  Administered 2021-03-22 (×3): 80 ug via INTRAVENOUS
  Administered 2021-03-22: 160 ug via INTRAVENOUS

## 2021-03-22 MED ORDER — FENTANYL CITRATE (PF) 100 MCG/2ML IJ SOLN
INTRAMUSCULAR | Status: AC
  Filled 2021-03-22: qty 2

## 2021-03-22 SURGICAL SUPPLY — 41 items
ADH SKN CLS APL DERMABOND .7 (GAUZE/BANDAGES/DRESSINGS) ×1
APL PRP STRL LF DISP 70% ISPRP (MISCELLANEOUS) ×1
APPLIER CLIP 9.375 MED OPEN (MISCELLANEOUS) ×2
APR CLP MED 9.3 20 MLT OPN (MISCELLANEOUS) ×1
BLADE SURG 15 STRL LF DISP TIS (BLADE) ×1 IMPLANT
BLADE SURG 15 STRL SS (BLADE) ×2
CANISTER SUC SOCK COL 7IN (MISCELLANEOUS) IMPLANT
CANISTER SUCT 1200ML W/VALVE (MISCELLANEOUS) IMPLANT
CHLORAPREP W/TINT 26 (MISCELLANEOUS) ×2 IMPLANT
CLIP APPLIE 9.375 MED OPEN (MISCELLANEOUS) ×1 IMPLANT
COVER BACK TABLE 60X90IN (DRAPES) ×2 IMPLANT
COVER MAYO STAND STRL (DRAPES) ×2 IMPLANT
COVER PROBE W GEL 5X96 (DRAPES) ×2 IMPLANT
DERMABOND ADVANCED (GAUZE/BANDAGES/DRESSINGS) ×1
DERMABOND ADVANCED .7 DNX12 (GAUZE/BANDAGES/DRESSINGS) ×1 IMPLANT
DRAPE LAPAROSCOPIC ABDOMINAL (DRAPES) ×2 IMPLANT
DRAPE UTILITY XL STRL (DRAPES) ×2 IMPLANT
ELECT COATED BLADE 2.86 ST (ELECTRODE) ×2 IMPLANT
ELECT REM PT RETURN 9FT ADLT (ELECTROSURGICAL) ×2
ELECTRODE REM PT RTRN 9FT ADLT (ELECTROSURGICAL) ×1 IMPLANT
GLOVE SURG ENC MOIS LTX SZ7.5 (GLOVE) ×2 IMPLANT
GLOVE SURG POLYISO LF SZ5.5 (GLOVE) ×1 IMPLANT
GLOVE SURG UNDER POLY LF SZ6.5 (GLOVE) ×1 IMPLANT
GOWN STRL REUS W/ TWL LRG LVL3 (GOWN DISPOSABLE) ×2 IMPLANT
GOWN STRL REUS W/TWL LRG LVL3 (GOWN DISPOSABLE) ×4
KIT MARKER MARGIN INK (KITS) ×2 IMPLANT
NDL HYPO 25X1 1.5 SAFETY (NEEDLE) ×1 IMPLANT
NEEDLE HYPO 25X1 1.5 SAFETY (NEEDLE) ×4 IMPLANT
NS IRRIG 500ML POUR BTL (IV SOLUTION) ×1 IMPLANT
PACK BASIN DAY SURGERY FS (CUSTOM PROCEDURE TRAY) ×2 IMPLANT
PENCIL SMOKE EVACUATOR (MISCELLANEOUS) ×2 IMPLANT
SLEEVE SCD COMPRESS KNEE MED (STOCKING) ×2 IMPLANT
SPONGE T-LAP 18X18 ~~LOC~~+RFID (SPONGE) ×2 IMPLANT
SUT MON AB 4-0 PC3 18 (SUTURE) ×4 IMPLANT
SUT VICRYL 3-0 CR8 SH (SUTURE) ×2 IMPLANT
SYR CONTROL 10ML LL (SYRINGE) ×3 IMPLANT
TOWEL GREEN STERILE FF (TOWEL DISPOSABLE) ×2 IMPLANT
TRACER MAGTRACE VIAL (MISCELLANEOUS) ×1 IMPLANT
TRAY FAXITRON CT DISP (TRAY / TRAY PROCEDURE) ×2 IMPLANT
TUBE CONNECTING 20X1/4 (TUBING) ×1 IMPLANT
YANKAUER SUCT BULB TIP NO VENT (SUCTIONS) ×1 IMPLANT

## 2021-03-22 NOTE — Anesthesia Procedure Notes (Signed)
Anesthesia Procedure Image    

## 2021-03-22 NOTE — Anesthesia Postprocedure Evaluation (Signed)
Anesthesia Post Note  Patient: CARYNN FELLING  Procedure(s) Performed: LEFT BREAST LUMPECTOMY WITH RADIOACTIVE SEED AND SENTINEL LYMPH NODE BIOPSY (Left: Breast)     Patient location during evaluation: PACU Anesthesia Type: General Level of consciousness: awake and alert Pain management: pain level controlled Vital Signs Assessment: post-procedure vital signs reviewed and stable Respiratory status: spontaneous breathing, nonlabored ventilation, respiratory function stable and patient connected to nasal cannula oxygen Cardiovascular status: blood pressure returned to baseline and stable Postop Assessment: no apparent nausea or vomiting Anesthetic complications: no   No notable events documented.  Last Vitals:  Vitals:   03/22/21 1015 03/22/21 1021  BP: (!) 155/81   Pulse: 87   Resp: 15   Temp:    SpO2: 95% 96%    Last Pain:  Vitals:   03/22/21 1010  TempSrc:   PainSc: Asleep                 [place] Sigmon S

## 2021-03-22 NOTE — Transfer of Care (Signed)
Immediate Anesthesia Transfer of Care Note  Patient: Miranda Fuller  Procedure(s) Performed: LEFT BREAST LUMPECTOMY WITH RADIOACTIVE SEED AND SENTINEL LYMPH NODE BIOPSY (Left: Breast)  Patient Location: PACU  Anesthesia Type:General  Level of Consciousness: drowsy  Airway & Oxygen Therapy: Patient Spontanous Breathing and Patient connected to face mask oxygen  Post-op Assessment: Report given to RN and Post -op Vital signs reviewed and stable  Post vital signs: Reviewed and stable  Last Vitals:  Vitals Value Taken Time  BP    Temp    Pulse    Resp    SpO2      Last Pain:  Vitals:   03/22/21 0757  TempSrc:   PainSc: 0-No pain         Complications: No notable events documented.

## 2021-03-22 NOTE — Discharge Instructions (Signed)

## 2021-03-22 NOTE — Anesthesia Procedure Notes (Signed)
Procedure Name: Intubation Date/Time: 03/22/2021 8:46 AM Performed by: Ezequiel Kayser, CRNA Pre-anesthesia Checklist: Patient identified, Emergency Drugs available, Suction available and Patient being monitored Patient Re-evaluated:Patient Re-evaluated prior to induction Oxygen Delivery Method: Circle System Utilized Preoxygenation: Pre-oxygenation with 100% oxygen Induction Type: IV induction Ventilation: Mask ventilation without difficulty Laryngoscope Size: Mac and 3 Grade View: Grade II Tube type: Oral Tube size: 7.0 mm Number of attempts: 1 Airway Equipment and Method: Stylet and Oral airway Placement Confirmation: ETT inserted through vocal cords under direct vision, positive ETCO2 and breath sounds checked- equal and bilateral Secured at: 22 cm Tube secured with: Tape Dental Injury: Teeth and Oropharynx as per pre-operative assessment  Comments: Despite multiple attempts to place #4 LMA, unable to obtain adequate placement and seal. Pt mask ventilated with oral airway and intubated. Atraumatic tracheal intubation

## 2021-03-22 NOTE — Op Note (Signed)
03/22/2021  10:02 AM  PATIENT:  Miranda Fuller  71 y.o. female  PRE-OPERATIVE DIAGNOSIS:  LEFT BREAST CANCER  POST-OPERATIVE DIAGNOSIS:  LEFT BREAST CANCER  PROCEDURE:  Procedure(s): LEFT BREAST LUMPECTOMY WITH RADIOACTIVE SEED LOCALIZATION AND DEEP LEFT AXILLARY SENTINEL LYMPH NODE BIOPSY (Left)  SURGEON:  Surgeon(s) and Role:    * Jovita Kussmaul, MD - Primary  PHYSICIAN ASSISTANT:   ASSISTANTS: none   ANESTHESIA:   local and general  EBL:  10 mL   BLOOD ADMINISTERED:none  DRAINS: none   LOCAL MEDICATIONS USED:  MARCAINE     SPECIMEN:  Source of Specimen:  left breast tissue and sentinel node  DISPOSITION OF SPECIMEN:  PATHOLOGY  COUNTS:  YES  TOURNIQUET:  * No tourniquets in log *  DICTATION: .Dragon Dictation  After informed consent was obtained the patient was brought to the operating room and placed in the supine position on the operating table.  After adequate induction of general anesthesia the patient's left chest, breast, and axillary area were prepped with Betadine and draped in usual sterile manner.  An appropriate timeout was performed.  Previously an I-125 seed was placed in the upper inner central quadrant of the left breast to mark an area of invasive breast cancer.  At this point, 2 cc of iron oxide were also injected in the subareolar plexus of the left breast and the breast was massaged for 5 minutes.  The mag trace was then used to examine the left axilla and I was able to identify a signal.  The area overlying this was infiltrated with quarter percent Marcaine.  A small transversely oriented incision was made with a 15 blade knife overlying the area of signal.  The incision was carried through the skin and subcutaneous tissue sharply with the electrocautery until the deep left axillary space was entered.  Using the mag trace I was able to identify a lymph node with signal.  The lymph node was excised sharply with the electrocautery and the surrounding small  vessels and lymphatics were controlled with clips.  No other hot or palpable nodes were identified in the left axilla.  This was sent as sentinel node #1.  Hemostasis was achieved using the Bovie electrocautery.  The deep layer of the incision was closed with interrupted 3-0 Vicryl stitches.  The skin was then closed with a running 4-0 Monocryl subcuticular stitch.  Attention was then turned to the left breast.  The neoprobe was set to I-125 in the area of radioactivity was readily identified.  The area around this was infiltrated with quarter percent Marcaine.  A curvilinear incision was made with a 15 blade knife along the upper inner edge of the areola of the left breast.  The incision was carried through the skin and subcutaneous tissue sharply with electrocautery.  Dissection was then carried superiorly and medially between the breast tissue and the subcutaneous fat and skin.  Once this dissection was well beyond the area of the cancer I then removed a circular portion of breast tissue sharply with the electrocautery around the radioactive seed while checking the area of radioactivity frequently.  Once the specimen was removed it was oriented with the appropriate paint colors.  A specimen radiograph was obtained that showed the clip and seed to be near the center of the specimen.  The specimen was then sent to pathology for further evaluation.  Hemostasis was achieved using the Bovie electrocautery.  The wound was irrigated with saline and infiltrated with more  quarter percent Marcaine.  The cavity was marked with clips.  The deep layer of the wound was closed with layers of interrupted 3-0 Vicryl stitches.  The skin was then closed with interrupted 4-0 Monocryl subcuticular stitches.  Dermabond dressings were applied.  The patient tolerated the procedure well.  At the end of the case all needle sponge and instrument counts were correct.  The patient was then awakened and taken recovery in stable  condition.  PLAN OF CARE: Discharge to home after PACU  PATIENT DISPOSITION:  PACU - hemodynamically stable.   Delay start of Pharmacological VTE agent (>24hrs) due to surgical blood loss or risk of bleeding: not applicable

## 2021-03-22 NOTE — Progress Notes (Signed)
Assisted Dr. Rose with left, ultrasound guided, pectoralis block. Side rails up, monitors on throughout procedure. See vital signs in flow sheet. Tolerated Procedure well. 

## 2021-03-22 NOTE — Interval H&P Note (Signed)
History and Physical Interval Note:  03/22/2021 8:25 AM  Miranda Fuller  has presented today for surgery, with the diagnosis of LEFT BREAST CANCER.  The various methods of treatment have been discussed with the patient and family. After consideration of risks, benefits and other options for treatment, the patient has consented to  Procedure(s): LEFT BREAST LUMPECTOMY WITH RADIOACTIVE SEED AND SENTINEL LYMPH NODE BIOPSY (Left) as a surgical intervention.  The patient's history has been reviewed, patient examined, no change in status, stable for surgery.  I have reviewed the patient's chart and labs.  Questions were answered to the patient's satisfaction.     [name]

## 2021-03-22 NOTE — H&P (Signed)
PROVIDER: Landry Corporal, MD  MRN: [id number] DOB: Aug 05, 1950 Subjective   Chief Complaint: New Patient (New patient left breast cancer )   History of Present Illness: Miranda Fuller is a 71 y.o. female who is seen today for new left breast cancer. 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 On her recent mammogram she was found to have a 7 mm area of abnormality in the upper inner quadrant of the left breast. This was biopsied and came back as an invasive ductal type of cancer that was ER and PR positive with a Ki-67 of 15%. The HER2 is pending. The axilla looked negative.  Review of Systems: A complete review of systems was obtained from the patient. I have reviewed this information and discussed as appropriate with the patient. See HPI as well for other ROS.  ROS   Medical History: Past Medical History:  Diagnosis Date   Anxiety   Arthritis   Diabetes mellitus without complication (CMS-HCC)   History of cancer   Hyperlipidemia   Sleep apnea   Patient Active Problem List  Diagnosis   Malignant neoplasm of upper-inner quadrant of left breast in female, estrogen receptor positive (CMS-HCC)   Past Surgical History:  Procedure Laterality Date   CHOLECYSTECTOMY   HYSTERECTOMY    Allergies  Allergen Reactions   Morphine Other (See Comments), Hives, Itching and Rash   Current Outpatient Medications on File Prior to Visit  Medication Sig Dispense Refill   insulin GLARGINE (LANTUS SOLOSTAR U-100 INSULIN) pen injector (concentration 100 units/mL)   metFORMIN (GLUCOPHAGE-XR) 500 MG XR tablet Take 1 tablet (500 mg total) by mouth   lisinopriL-hydrochlorothiazide (ZESTORETIC) 10-12.5 mg tablet Take 1 tablet by mouth once daily   venlafaxine (EFFEXOR-XR) 75 MG XR capsule Take 1 capsule (75 mg total) by mouth once daily   No current facility-administered medications on  file prior to visit.   Family History  Problem Relation Age of Onset   Diabetes Mother   Obesity Mother   Deep vein thrombosis (DVT or abnormal blood clot formation) Mother   Colon cancer Mother   Breast cancer Mother   Stroke Father   High blood pressure (Hypertension) Father   Hyperlipidemia (Elevated cholesterol) Father    Social History   Tobacco Use  Smoking Status Never  Smokeless Tobacco Never    Social History   Socioeconomic History   Marital status: Widowed  Tobacco Use   Smoking status: Never   Smokeless tobacco: Never  Substance and Sexual Activity   Alcohol use: Never   Drug use: Never   Objective:   Vitals:  BP: (!) 142/80  Pulse: 102  Temp: 36.7 C (98 F)  SpO2: 99%  Weight: 99.8 kg (220 lb)  Height: 149.9 cm (4' 11" )   Body mass index is 44.43 kg/m.  Physical Exam Vitals reviewed.  Constitutional:  General: She is not in acute distress. Appearance: Normal appearance.  HENT:  Head: Normocephalic and atraumatic.  Right Ear: External ear normal.  Left Ear: External ear normal.  Nose: Nose normal.  Mouth/Throat:  Mouth: Mucous membranes are moist.  Pharynx: Oropharynx is clear.  Eyes:  General: No scleral icterus. Extraocular Movements: Extraocular movements intact.  Conjunctiva/sclera: Conjunctivae normal.  Pupils: Pupils are equal, round, and reactive to light.  Cardiovascular:  Rate and Rhythm: Normal rate and regular rhythm.  Pulses: Normal pulses.  Heart sounds:  Normal heart sounds.  Pulmonary:  Effort: Pulmonary effort is normal. No respiratory distress.  Breath sounds: Normal breath sounds.  Abdominal:  General: Bowel sounds are normal.  Palpations: Abdomen is soft.  Tenderness: There is no abdominal tenderness.  Musculoskeletal:  General: No swelling, tenderness or deformity. Normal range of motion.  Cervical back: Normal range of motion and neck supple.  Skin: General: Skin is warm and dry.  Coloration: Skin is not  jaundiced.  Neurological:  General: No focal deficit present.  Mental Status: She is alert and oriented to person, place, and time.  Psychiatric:  Mood and Affect: Mood normal.  Behavior: Behavior normal.     Breast: There is a moderate sized hematoma in the upper inner quadrant of the left breast. Other than this there is no palpable mass in either breast. There is no palpable axillary, supraclavicular, or cervical lymphadenopathy.  Labs, Imaging and Diagnostic Testing:  Assessment and Plan:  Diagnoses and all orders for this visit:  Malignant neoplasm of upper-inner quadrant of left breast in female, estrogen receptor positive (CMS-HCC) - Ambulatory Referral to Oncology-Medical - Ambulatory Referral to Radiation Oncology - Ambulatory Referral to Physical Therapy - [facility] Case Posting Request; Future    The patient appears to have a small stage I cancer in the upper inner quadrant of the left breast with clinically negative nodes. I have discussed with her in detail the different options for treatment and at this point she favors breast conservation which I feel is very reasonable. She is also a good candidate for sentinel node biopsy. I have discussed with her in detail the risks and benefits of the operation as well as some of the technical aspects including the use of a radioactive seed for localization and she understands and wishes to proceed. She will be referred back to medical and radiation oncology for adjuvant therapy. I will also refer her to physical therapy for preoperative lymphedema testing

## 2021-03-22 NOTE — Anesthesia Preprocedure Evaluation (Addendum)
Anesthesia Evaluation  Patient identified by MRN, date of birth, ID band Patient awake    Reviewed: Allergy & Precautions, NPO status , Patient's Chart, lab work & pertinent test results  History of Anesthesia Complications (+) PONV  Airway Mallampati: II  TM Distance: >3 FB Neck ROM: Full    Dental no notable dental hx.    Pulmonary sleep apnea ,    Pulmonary exam normal breath sounds clear to auscultation       Cardiovascular hypertension, Normal cardiovascular exam Rhythm:Regular Rate:Normal     Neuro/Psych Dementia negative neurological ROS     GI/Hepatic negative GI ROS, Neg liver ROS,   Endo/Other  diabetes, Type 2  Renal/GU negative Renal ROS  negative genitourinary   Musculoskeletal negative musculoskeletal ROS (+)   Abdominal   Peds negative pediatric ROS (+)  Hematology negative hematology ROS (+)   Anesthesia Other Findings   Reproductive/Obstetrics negative OB ROS                             Anesthesia Physical Anesthesia Plan  ASA: 3  Anesthesia Plan: General   Post-op Pain Management: Regional block   Induction: Intravenous  PONV Risk Score and Plan: 4 or greater and Ondansetron, Dexamethasone, Treatment may vary due to age or medical condition and Droperidol  Airway Management Planned: LMA  Additional Equipment:   Intra-op Plan:   Post-operative Plan: Extubation in OR  Informed Consent: I have reviewed the patients History and Physical, chart, labs and discussed the procedure including the risks, benefits and alternatives for the proposed anesthesia with the patient or authorized representative who has indicated his/her understanding and acceptance.     Dental advisory given  Plan Discussed with: CRNA and Surgeon  Anesthesia Plan Comments:         Anesthesia Quick Evaluation

## 2021-03-22 NOTE — Anesthesia Procedure Notes (Signed)
Anesthesia Regional Block: Pectoralis block   Pre-Anesthetic Checklist: , timeout performed,  Correct Patient, Correct Site, Correct Laterality,  Correct Procedure, Correct Position, site marked,  Risks and benefits discussed,  Surgical consent,  Pre-op evaluation,  At surgeon's request and post-op pain management  Laterality: Left  Prep: chloraprep       Needles:  Injection technique: Single-shot  Needle Type: Echogenic Needle     Needle Length: 9cm      Additional Needles:   Procedures:,,,, ultrasound used (permanent image in chart),,    Narrative:  Start time: 03/22/2021 7:54 AM End time: 03/22/2021 8:03 AM Injection made incrementally with aspirations every 5 mL.  Performed by: Personally  Anesthesiologist: Myrtie Soman, MD  Additional Notes: Patient tolerated the procedure well without complications

## 2021-03-25 ENCOUNTER — Encounter (HOSPITAL_BASED_OUTPATIENT_CLINIC_OR_DEPARTMENT_OTHER): Payer: Self-pay | Admitting: General Surgery

## 2021-03-25 LAB — SURGICAL PATHOLOGY

## 2021-03-28 ENCOUNTER — Encounter: Payer: Self-pay | Admitting: *Deleted

## 2021-03-28 ENCOUNTER — Telehealth: Payer: Self-pay | Admitting: Hematology and Oncology

## 2021-03-28 NOTE — Telephone Encounter (Signed)
Sch per 1/1 inbasket, pt & pt daughter aware

## 2021-03-28 NOTE — Telephone Encounter (Signed)
Sch per 1/11 inbasket, pt aware °

## 2021-03-29 NOTE — Progress Notes (Signed)
° °Patient Care Team: °Sasser, Paul W, MD as PCP - General (Family Medicine) °McAlhany, Christopher D, MD as PCP - Cardiology (Cardiology) °, , MD as Consulting Physician (Hematology and Oncology) °Toth, Paul III, MD as Consulting Physician (General Surgery) °Stuart, Dawn C, RN as Oncology Nurse Navigator °Martini, Keisha N, RN as Oncology Nurse Navigator ° °DIAGNOSIS:  °  ICD-10-CM   °1. Ductal carcinoma in situ (DCIS) of right breast  D05.11   °  °2. Malignant neoplasm involving both nipple and areola of left breast in female, estrogen receptor negative (HCC)  C50.012   ° Z17.1   °  ° ° °SUMMARY OF ONCOLOGIC HISTORY: °Oncology History  °Ductal carcinoma in situ (DCIS) of right breast  °12/27/2018 Initial Diagnosis  ° Patient noted bilateral dark red spontaneous nipple discharge and a left breast lump palpable on exam. Mammogram showed calcifications in the right breast spanning 3.8mm and no correlate for the previously palpable left breast lump. Breast MRI showed no evidence of malignancy bilaterally. Right breast biopsy showed intermediate grade DCIS, ER+ 100%, PR+ 70%.  °  °03/01/2019 Genetic Testing  ° Negative genetic testing:  No pathogenic variants detected on the [facility] Breast Cancer STAT panel or the [facility] Multi-Cancer panel. A variant of uncertain significance was detected in the APC gene called c.449A>G. The report date is 03/01/2019. ° °The Breast Cancer STAT panel offered by [facility] includes sequencing and rearrangement analysis for the following 9 genes:  ATM, BRCA1, BRCA2, CDH1, CHEK2, PALB2, PTEN, STK11 and TP53.  The Multi-Cancer Panel offered by [facility] includes sequencing and/or deletion duplication testing of the following 85 genes: AIP, ALK, APC, ATM, AXIN2,BAP1,  BARD1, BLM, BMPR1A, BRCA1, BRCA2, BRIP1, CASR, CDC73, CDH1, CDK4, CDKN1B, CDKN1C, CDKN2A (p14ARF), CDKN2A (p16INK4a), CEBPA, CHEK2, CTNNA1, DICER1, DIS3L2, EGFR (c.2369C>T, p.Thr790Met variant only), EPCAM  (Deletion/duplication testing only), FH, FLCN, GATA2, GPC3, GREM1 (Promoter region deletion/duplication testing only), HOXB13 (c.251G>A, p.Gly84Glu), HRAS, KIT, MAX, MEN1, MET, MITF (c.952G>A, p.Glu318Lys variant only), MLH1, MSH2, MSH3, MSH6, MUTYH, NBN, NF1, NF2, NTHL1, PALB2, PDGFRA, PHOX2B, PMS2, POLD1, POLE, POT1, PRKAR1A, PTCH1, PTEN, RAD50, RAD51C, RAD51D, RB1, RECQL4, RET, RNF43, RUNX1, SDHAF2, SDHA (sequence changes only), SDHB, SDHC, SDHD, SMAD4, SMARCA4, SMARCB1, SMARCE1, STK11, SUFU, TERC, TERT, TMEM127, TP53, TSC1, TSC2, VHL, WRN and WT1.   °  °04/28/2019 Cancer Staging  ° Staging form: Breast, AJCC 8th Edition °- Pathologic stage from 04/28/2019: Stage 0 (pTis (DCIS), pN0, cM0, ER+, PR+) °  °04/28/2019 Surgery  ° Right lumpectomy (Toth) ([id number]): intermediate grade DCIS, clear margins. No regional lymph nodes were examined. °  °06/16/2019 - 07/11/2019 Radiation Therapy  ° Radiation at [facility]. °  °07/2019 - 07/2024 Anti-estrogen oral therapy  ° Tamoxifen °  °03/22/2021 Relapse/Recurrence  ° Left Breast Biopsy: Grade 1-2 IDC (0.5 cm) °Left Lumpectomy: 1.5 cm IG DCIS 0/1 LN Neg, ER 100%, PR 100%, Her 2 Neg, Ki 67: 15%, (no residual IDC) °  ° ° °CHIEF COMPLIANT: Follow-up after recent surgery for left breast cancer ° °INTERVAL HISTORY: Miranda Fuller is a 71 y.o. with above-mentioned history of right breast DCIS having undergone lumpectomy and radiation,.  She did not take adjuvant antiestrogen therapy because of concern for side effects.  She came in with mammogram showing left breast abnormalities which on biopsy showed grade 1-2 invasive ductal cancer measuring 0.5 cm.  She subsequent underwent lumpectomy with sentinel lymph node biopsy that revealed a 1.5 cm of intermediate grade DCIS.  There was no further invasive breast cancer and the tumor was   ER/PR positive HER2 negative with a Ki-67 of 15%.  She is here in our office to discuss the pathology report and discuss her treatment plan. ° °ALLERGIES:  is  allergic to oxycodone-acetaminophen, propoxyphene, demerol [meperidine], morphine and related, oxycodone-aspirin, and chlorhexidine. ° °MEDICATIONS:  °Current Outpatient Medications  °Medication Sig Dispense Refill  ° aspirin 81 MG tablet Take 1 tablet by mouth daily.    ° buPROPion (WELLBUTRIN SR) 150 MG 12 hr tablet Take 150 mg by mouth 2 (two) times daily.    ° Calcium Carbonate-Vit D-Min (CALCIUM 1200 PO) Take 1,200 mg by mouth 2 (two) times daily.    ° Cholecalciferol (VITAMIN D-3) 25 MCG (1000 UT) CAPS Take 3 capsules by mouth daily.    ° insulin aspart (NOVOLOG) 100 UNIT/ML injection Inject 5-15 Units into the skin 3 (three) times daily with meals. 5-15 units on sliding scale     ° insulin glargine (LANTUS) 100 UNIT/ML injection Inject 60 Units into the skin at bedtime.     ° lisinopril-hydrochlorothiazide (ZESTORETIC) 10-12.5 MG tablet Take 1 tablet by mouth daily.    ° metFORMIN (GLUCOPHAGE-XR) 500 MG 24 hr tablet Take 500 mg by mouth daily with breakfast.    ° Multiple Vitamins-Minerals (MULTIVITAMIN ADULT PO) Take by mouth daily.    ° Pediatric Multivitamins-Iron (FLINTSTONES W/IRON PO) Take 2 tablets by mouth daily.    ° tamoxifen (NOLVADEX) 20 MG tablet Take 1 tablet (20 mg total) by mouth daily. 90 tablet 3  ° traMADol (ULTRAM) 50 MG tablet Take 1 tablet (50 mg total) by mouth every 6 (six) hours as needed. 20 tablet 0  ° venlafaxine XR (EFFEXOR-XR) 75 MG 24 hr capsule Take 75 mg by mouth daily.    ° vitamin B-12 (CYANOCOBALAMIN) 500 MCG tablet Take 1,000 mcg by mouth 2 (two) times daily.    ° °No current facility-administered medications for this visit.  ° ° °PHYSICAL EXAMINATION: °ECOG PERFORMANCE STATUS: 1 - Symptomatic but completely ambulatory ° °Vitals:  ° 04/01/21 1048  °BP: (!) 165/72  °Pulse: 87  °Resp: 18  °Temp: (!) 97.2 °F (36.2 °C)  °SpO2: 99%  ° °Filed Weights  ° 04/01/21 1048  °Weight: 211 lb 11.2 oz (96 kg)  ° °  ° °LABORATORY DATA:  °I have reviewed the data as listed °CMP Latest Ref  Rng & Units 03/15/2021 04/25/2019 02/22/2019  °Glucose 70 - 99 mg/dL 192(H) 148(H) 219(H)  °BUN 8 - 23 mg/dL 16 22 17  °Creatinine 0.44 - 1.00 mg/dL 0.74 0.76 0.80  °Sodium 135 - 145 mmol/L 137 137 138  °Potassium 3.5 - 5.1 mmol/L 4.3 4.3 4.3  °Chloride 98 - 111 mmol/L 101 105 103  °CO2 22 - 32 mmol/L 27 23 26  °Calcium 8.9 - 10.3 mg/dL 9.0 9.0 9.0  °Total Protein 6.5 - 8.1 g/dL - - 7.2  °Total Bilirubin 0.3 - 1.2 mg/dL - - 0.5  °Alkaline Phos 38 - 126 U/L - - 69  °AST 15 - 41 U/L - - 13(L)  °ALT 0 - 44 U/L - - 12  ° ° °Lab Results  °Component Value Date  ° WBC 7.7 02/22/2019  ° HGB 14.5 02/22/2019  ° HCT 43.4 02/22/2019  ° MCV 94.6 02/22/2019  ° PLT 246 02/22/2019  ° NEUTROABS 4.1 02/22/2019  ° ° °ASSESSMENT & PLAN:  °Ductal carcinoma in situ (DCIS) of right breast °12/27/2018:Patient noted bilateral dark red spontaneous nipple discharge and a left breast lump palpable on exam. Mammogram showed calcifications in the   right breast spanning 3.8mm and no correlate for the previously palpable left breast lump. Breast MRI showed no evidence of malignancy bilaterally. Right breast biopsy showed intermediate grade DCIS, ER+ 100%, PR+ 70%.  °  °04/28/2019: Right lumpectomy (Toth): intermediate grade DCIS, clear margins ER 100%, PR 70% °Adjuvant radiation at [facility] °  °Plan: Adjuvant antiestrogen therapy with tamoxifen 20 mg daily x5 years was recommended but she did not take it. ° ° °Malignant neoplasm involving both nipple and areola of left breast in female, estrogen receptor negative (HCC) °Left Breast Biopsy: Grade 1-2 IDC (0.5 cm) °Left Lumpectomy: 1.5 cm IG DCIS 0/1 LN Neg, ER 100%, PR 100%, Her 2 Neg, Ki 67: 15%, (no residual IDC) ° °Pathology counseling: I discussed the final pathology report of the patient provided  a copy of this report. I discussed the margins as well as lymph node surgeries. We also discussed the final staging along with previously performed ER/PR and HER-2/neu testing. ° °Recommendations: °1.  Adjuvant radiation therapy followed by °2. Adjuvant antiestrogen therapy: recommend Letrozole ° °Letrozole counseling: We discussed the risks and benefits of anti-estrogen therapy with aromatase inhibitors. These include but not limited to insomnia, hot flashes, mood changes, vaginal dryness, bone density loss, and weight gain. We strongly believe that the benefits far outweigh the risks. Patient understands these risks and consented to starting treatment. Planned treatment duration is 5 years. ° °Will order bone density test to be done at the breast center ° °RTC after XRT ° ° ° °No orders of the defined types were placed in this encounter. ° °The patient has a good understanding of the overall plan. she agrees with it. she will call with any problems that may develop before the next visit here. ° °Total time spent: 30 mins including face to face time and time spent for planning, charting and coordination of care ° ° K , MD, MPH °04/01/2021 ° °I, Kirstyn Evans, am acting as scribe for Dr.  . ° °I have reviewed the above documentation for accuracy and completeness, and I agree with the above. ° ° ° ° ° ° °

## 2021-03-31 DIAGNOSIS — C50012 Malignant neoplasm of nipple and areola, left female breast: Secondary | ICD-10-CM | POA: Insufficient documentation

## 2021-03-31 DIAGNOSIS — Z171 Estrogen receptor negative status [ER-]: Secondary | ICD-10-CM | POA: Insufficient documentation

## 2021-03-31 NOTE — Assessment & Plan Note (Signed)
Left Breast Biopsy: Grade 1-2 IDC (0.5 cm) Left Lumpectomy: 1.5 cm IG DCIS 0/1 LN Neg, ER 100%, PR 100%, Her 2 Neg, Ki 67: 15%, (no residual IDC)  Pathology counseling: I discussed the final pathology report of the patient provided  a copy of this report. I discussed the margins as well as lymph node surgeries. We also discussed the final staging along with previously performed ER/PR and HER-2/neu testing.  Recommendations: 1. Adjuvant radiation therapy followed by 2. Adjuvant antiestrogen therapy: recommend switching from Tamoxifen to Letrozole  RTC after XRT

## 2021-03-31 NOTE — Assessment & Plan Note (Signed)
12/27/2018:Patient noted bilateral dark red spontaneous nipple discharge and a left breast lump palpable on exam. Mammogram showed calcifications in the right breast spanning 3.71mm and no correlate for the previously palpable left breast lump. Breast MRI showed no evidence of malignancy bilaterally. Right breast biopsy showed intermediate grade DCIS, ER+ 100%, PR+ 70%.  04/28/2019: Right lumpectomy [name]): intermediate grade DCIS, clear margins ER 100%, PR 70% Adjuvant radiation at [facility]  Plan: Adjuvant antiestrogen therapy with tamoxifen 20 mg daily x5 years Tamoxifen Toxicities:

## 2021-04-01 ENCOUNTER — Inpatient Hospital Stay: Payer: Medicare Other | Attending: Hematology and Oncology | Admitting: Hematology and Oncology

## 2021-04-01 ENCOUNTER — Other Ambulatory Visit: Payer: Self-pay

## 2021-04-01 VITALS — BP 165/72 | HR 87 | Temp 97.2°F | Resp 18 | Ht 59.0 in | Wt 211.7 lb

## 2021-04-01 DIAGNOSIS — Z923 Personal history of irradiation: Secondary | ICD-10-CM | POA: Insufficient documentation

## 2021-04-01 DIAGNOSIS — Z171 Estrogen receptor negative status [ER-]: Secondary | ICD-10-CM | POA: Diagnosis not present

## 2021-04-01 DIAGNOSIS — C50012 Malignant neoplasm of nipple and areola, left female breast: Secondary | ICD-10-CM

## 2021-04-01 DIAGNOSIS — Z780 Asymptomatic menopausal state: Secondary | ICD-10-CM

## 2021-04-01 DIAGNOSIS — Z79899 Other long term (current) drug therapy: Secondary | ICD-10-CM | POA: Insufficient documentation

## 2021-04-01 DIAGNOSIS — C50912 Malignant neoplasm of unspecified site of left female breast: Secondary | ICD-10-CM | POA: Diagnosis not present

## 2021-04-01 DIAGNOSIS — D0511 Intraductal carcinoma in situ of right breast: Secondary | ICD-10-CM | POA: Insufficient documentation

## 2021-04-01 DIAGNOSIS — Z170 Estrogen receptor positive status [ER+]: Secondary | ICD-10-CM | POA: Insufficient documentation

## 2021-04-01 DIAGNOSIS — Z79810 Long term (current) use of selective estrogen receptor modulators (SERMs): Secondary | ICD-10-CM | POA: Insufficient documentation

## 2021-04-02 ENCOUNTER — Encounter: Payer: Self-pay | Admitting: *Deleted

## 2021-04-11 ENCOUNTER — Ambulatory Visit: Payer: Medicare Other | Attending: General Surgery | Admitting: Rehabilitation

## 2021-04-11 ENCOUNTER — Other Ambulatory Visit: Payer: Self-pay

## 2021-04-11 ENCOUNTER — Encounter: Payer: Self-pay | Admitting: Rehabilitation

## 2021-04-11 DIAGNOSIS — Z483 Aftercare following surgery for neoplasm: Secondary | ICD-10-CM | POA: Insufficient documentation

## 2021-04-11 DIAGNOSIS — R293 Abnormal posture: Secondary | ICD-10-CM | POA: Insufficient documentation

## 2021-04-11 DIAGNOSIS — C50912 Malignant neoplasm of unspecified site of left female breast: Secondary | ICD-10-CM | POA: Insufficient documentation

## 2021-04-11 NOTE — Therapy (Signed)
[facility] @ [place], [place], [address] Phone: [phone]   Fax:  [phone]  Physical Therapy Treatment  Patient Details  Name: Miranda Fuller MRN: [id number] Date of Birth: [birth year] Referring Provider (PT): Dr. [name]   Encounter Date: 04/11/2021   PT End of Session - 04/11/21 0939     Visit Number 2    Number of Visits 2    Date for PT Re-Evaluation 04/15/21    PT Start Time 0900    PT Stop Time 0930    PT Time Calculation (min) 30 min    Activity Tolerance Patient tolerated treatment well    Behavior During Therapy [facility] for tasks assessed/performed             Past Medical History:  Diagnosis Date   Breast mass 12/27/2018   right- cancer, s/p lumpectomy, radiation   Coccidioidomycosis, pulmonary ([place])    Complication of anesthesia    DDD (degenerative disc disease), cervical    Dementia ([place])    Early per Dr. Luan Pulling   Depression 2009   DM (diabetes mellitus screen) 2009   Family history of bone cancer    Family history of brain cancer    Family history of breast cancer    Family history of esophageal cancer    Family history of lung cancer    Glaucoma    HTN (hypertension)    Morbid obesity ([place])    Nodule of upper lobe of left lung 12/27/2018   Personal history of radiation therapy    PONV (postoperative nausea and vomiting)    Sleep apnea    had surgery   Type II diabetes mellitus, uncontrolled 01/31/2019    Past Surgical History:  Procedure Laterality Date   ABDOMINAL HYSTERECTOMY     TAH BSO   BREAST BIOPSY Right 02/07/2019   BREAST LUMPECTOMY Right 04/28/2019   BREAST LUMPECTOMY WITH RADIOACTIVE SEED AND SENTINEL LYMPH NODE BIOPSY Left 03/22/2021   Procedure: LEFT BREAST LUMPECTOMY WITH RADIOACTIVE SEED AND SENTINEL LYMPH NODE BIOPSY;  Surgeon: Jovita Kussmaul, MD;  Location: [place];  Service: General;  Laterality: Left;   BREAST LUMPECTOMY WITH RADIOACTIVE  SEED LOCALIZATION Right 04/28/2019   Procedure: RIGHT BREAST LUMPECTOMY WITH RADIOACTIVE SEED LOCALIZATION;  Surgeon: Jovita Kussmaul, MD;  Location: [place];  Service: General;  Laterality: Right;   CHOLECYSTECTOMY     LAPAROSCOPIC GASTRIC SLEEVE RESECTION     TUBAL LIGATION     UVULOPALATOPHARYNGOPLASTY      There were no vitals filed for this visit.   Subjective Assessment - 04/11/21 0902     Subjective I am doing okay. Noticing no breast edema or swelling.    Pertinent History Lt lumpectomy 03/22/21 with Dr. [name] 0/1 LN negative. Rt lumpectomy 2 years ago but no lymph nodes removed.  Will have radiation..    Currently in Pain? No/denies                [facility] PT Assessment - 04/11/21 0001       Assessment   Medical Diagnosis Lt breast cancer    Referring Provider (PT) Dr. [name]    Onset Date/Surgical Date 03/04/21    Hand Dominance Right      Precautions   Precaution Comments lymphedema risk Lt UE      Observation/Other Assessments   Observations well healing incisions superior nipple and axilla. No signs of edema - education on scar massage  AROM   Left Shoulder Flexion 155 Degrees    Left Shoulder ABduction 175 Degrees    Left Shoulder External Rotation 82 Degrees               LYMPHEDEMA/ONCOLOGY QUESTIONNAIRE - 04/11/21 0001       Type   Cancer Type Lt breast - Hx of Rt breast      Surgeries   Lumpectomy Date 03/22/21      Treatment   Active Chemotherapy Treatment No    Past Chemotherapy Treatment No    Active Radiation Treatment No    Past Radiation Treatment Yes    Body Site Rt breast      Left Upper Extremity Lymphedema   At Axilla  37.5 cm    Olecranon Process 27 cm    Just Proximal to Ulnar Styloid Process 16 cm    Across Hand at [employer] 18.5 cm                Katina Dung - 04/11/21 0001     Open a tight or new jar Mild difficulty    Do heavy household chores (wash walls, wash floors) Mild  difficulty    Carry a shopping bag or briefcase No difficulty    Wash your back No difficulty    Use a knife to cut food No difficulty    Recreational activities in which you take some force or impact through your arm, shoulder, or hand (golf, hammering, tennis) No difficulty    During the past week, to what extent has your arm, shoulder or hand problem interfered with your normal social activities with family, friends, neighbors, or groups? Modererately    During the past week, to what extent has your arm, shoulder or hand problem limited your work or other regular daily activities Slightly    Arm, shoulder, or hand pain. Severe    Tingling (pins and needles) in your arm, shoulder, or hand None    Difficulty Sleeping Moderate difficulty    DASH Score 22.73 %                             PT Education - 04/11/21 0939     Education Details per instruction section    Person(s) Educated Patient;Child(ren)    Methods Explanation;Handout    Comprehension Verbalized understanding                 PT Long Term Goals - 04/11/21 0942       PT LONG TERM GOAL #1   Title Pt will return to baseline AROM    Status Achieved                   Plan - 04/11/21 0939     Clinical Impression Statement Pt presents post Lt lumpectomy and removal of 2 neg lymph nodes.  Pt has returned to baseline AROM and PLOF,  No pain, edema, or cording noted.  Pt was educated on scar massage, lymphedema risk reduction, moving and stretching throughout and after radiation.  Pt will continue with SOZO screenings.    PT Next Visit Plan 3 month SOZO    Consulted and Agree with Plan of Care Patient;Family member/caregiver             Patient will benefit from skilled therapeutic intervention in order to improve the following deficits and impairments:     Visit Diagnosis: Abnormal posture  Primary malignant  neoplasm of left breast [facility])  Aftercare following surgery for  neoplasm     Problem List Patient Active Problem List   Diagnosis Date Noted   Malignant neoplasm involving both nipple and areola of left breast in female, estrogen receptor negative ([place]) 03/31/2021   DOE (dyspnea on exertion) 03/14/2019   Genetic testing 03/01/2019   Family history of breast cancer    Family history of esophageal cancer    Family history of brain cancer    Family history of lung cancer    Family history of bone cancer    pulmonary nodule 02/17/2019   Family history of cancer 02/17/2019   Breast discharge 02/17/2019   Type II diabetes mellitus, uncontrolled 01/31/2019   Ductal carcinoma in situ (DCIS) of right breast 12/27/2018   Nodule of upper lobe of left lung 12/27/2018   Obstructive sleep apnea syndrome 11/28/2015   Essential hypertension 11/28/2015   Precordial chest pain 11/24/2014   Morbid obesity ([place]) 11/24/2014    Stark Bray, PT 04/11/2021, 9:42 AM  [place] @ [place], [place], [address] Phone: [phone]   Fax:  [phone]  Name: Miranda Fuller MRN: [id number] Date of Birth: 06/17/1950

## 2021-04-11 NOTE — Patient Instructions (Addendum)
° ° °   [facility]  [address], [address]  [place] [address]  [phone]  Lymphedema risk reduction - per pink handout sheet  Scar massage You can begin gentle scar massage to you incision sites when all scabs and glue have gone. Gently place one hand on the incision and move the skin (without sliding on the skin) in various directions. Do this for a few minutes and then you can gently massage either coconut oil or vitamin E cream into the scars.  Compression garment You should continue wearing your compression bra until you feel like you no longer have swelling.  Home exercise Program Continue doing the exercises you were given until you feel like you can do them without feeling any tightness at the end.   Walking Program Studies show that 30 minutes of walking per day (fast enough to elevate your heart rate) can significantly reduce the risk of a cancer recurrence. If you can't walk due to other medical reasons, we encourage you to find another activity you could do (like a stationary bike or water exercise).  Posture After breast cancer surgery, people frequently sit with rounded shoulders posture because it puts their incisions on slack and feels better. If you sit like this and scar tissue forms in that position, you can become very tight and have pain sitting or standing with good posture. Try to be aware of your posture and sit and stand up tall to heal properly.  Follow up PT: It is recommended you return every 3 months for the first 3 years following surgery to be assessed on the SOZO machine for an L-Dex score. This helps prevent clinically significant lymphedema in 95% of patients. These follow up screens are 10 minute appointments that you are not billed for.   _April 3rd at 940am -  at PT clinic

## 2021-04-22 DIAGNOSIS — Z510 Encounter for antineoplastic radiation therapy: Secondary | ICD-10-CM | POA: Diagnosis not present

## 2021-04-22 DIAGNOSIS — Z170 Estrogen receptor positive status [ER+]: Secondary | ICD-10-CM | POA: Diagnosis not present

## 2021-04-22 DIAGNOSIS — C50912 Malignant neoplasm of unspecified site of left female breast: Secondary | ICD-10-CM | POA: Diagnosis not present

## 2021-04-22 DIAGNOSIS — C50212 Malignant neoplasm of upper-inner quadrant of left female breast: Secondary | ICD-10-CM | POA: Diagnosis not present

## 2021-04-29 DIAGNOSIS — Z170 Estrogen receptor positive status [ER+]: Secondary | ICD-10-CM | POA: Diagnosis not present

## 2021-04-29 DIAGNOSIS — C50212 Malignant neoplasm of upper-inner quadrant of left female breast: Secondary | ICD-10-CM | POA: Diagnosis not present

## 2021-04-29 DIAGNOSIS — Z510 Encounter for antineoplastic radiation therapy: Secondary | ICD-10-CM | POA: Diagnosis not present

## 2021-05-03 DIAGNOSIS — E1165 Type 2 diabetes mellitus with hyperglycemia: Secondary | ICD-10-CM | POA: Diagnosis not present

## 2021-05-03 DIAGNOSIS — C50919 Malignant neoplasm of unspecified site of unspecified female breast: Secondary | ICD-10-CM | POA: Diagnosis not present

## 2021-05-03 DIAGNOSIS — R5383 Other fatigue: Secondary | ICD-10-CM | POA: Diagnosis not present

## 2021-05-03 DIAGNOSIS — I10 Essential (primary) hypertension: Secondary | ICD-10-CM | POA: Diagnosis not present

## 2021-05-03 DIAGNOSIS — E782 Mixed hyperlipidemia: Secondary | ICD-10-CM | POA: Diagnosis not present

## 2021-05-03 DIAGNOSIS — E1122 Type 2 diabetes mellitus with diabetic chronic kidney disease: Secondary | ICD-10-CM | POA: Diagnosis not present

## 2021-05-03 DIAGNOSIS — E7849 Other hyperlipidemia: Secondary | ICD-10-CM | POA: Diagnosis not present

## 2021-05-07 DIAGNOSIS — I10 Essential (primary) hypertension: Secondary | ICD-10-CM | POA: Diagnosis not present

## 2021-05-07 DIAGNOSIS — C50919 Malignant neoplasm of unspecified site of unspecified female breast: Secondary | ICD-10-CM | POA: Diagnosis not present

## 2021-05-07 DIAGNOSIS — Z510 Encounter for antineoplastic radiation therapy: Secondary | ICD-10-CM | POA: Diagnosis not present

## 2021-05-07 DIAGNOSIS — Z170 Estrogen receptor positive status [ER+]: Secondary | ICD-10-CM | POA: Diagnosis not present

## 2021-05-07 DIAGNOSIS — M1810 Unilateral primary osteoarthritis of first carpometacarpal joint, unspecified hand: Secondary | ICD-10-CM | POA: Diagnosis not present

## 2021-05-07 DIAGNOSIS — C50212 Malignant neoplasm of upper-inner quadrant of left female breast: Secondary | ICD-10-CM | POA: Diagnosis not present

## 2021-05-07 DIAGNOSIS — E1165 Type 2 diabetes mellitus with hyperglycemia: Secondary | ICD-10-CM | POA: Diagnosis not present

## 2021-05-07 DIAGNOSIS — E7849 Other hyperlipidemia: Secondary | ICD-10-CM | POA: Diagnosis not present

## 2021-05-10 DIAGNOSIS — C50212 Malignant neoplasm of upper-inner quadrant of left female breast: Secondary | ICD-10-CM | POA: Diagnosis not present

## 2021-05-10 DIAGNOSIS — Z510 Encounter for antineoplastic radiation therapy: Secondary | ICD-10-CM | POA: Diagnosis not present

## 2021-05-10 DIAGNOSIS — Z170 Estrogen receptor positive status [ER+]: Secondary | ICD-10-CM | POA: Diagnosis not present

## 2021-05-13 DIAGNOSIS — C50212 Malignant neoplasm of upper-inner quadrant of left female breast: Secondary | ICD-10-CM | POA: Diagnosis not present

## 2021-05-13 DIAGNOSIS — Z170 Estrogen receptor positive status [ER+]: Secondary | ICD-10-CM | POA: Diagnosis not present

## 2021-05-13 DIAGNOSIS — Z510 Encounter for antineoplastic radiation therapy: Secondary | ICD-10-CM | POA: Diagnosis not present

## 2021-05-14 DIAGNOSIS — R911 Solitary pulmonary nodule: Secondary | ICD-10-CM | POA: Diagnosis not present

## 2021-05-14 DIAGNOSIS — R0789 Other chest pain: Secondary | ICD-10-CM | POA: Diagnosis not present

## 2021-05-14 DIAGNOSIS — R079 Chest pain, unspecified: Secondary | ICD-10-CM | POA: Diagnosis not present

## 2021-05-14 DIAGNOSIS — Z9049 Acquired absence of other specified parts of digestive tract: Secondary | ICD-10-CM | POA: Diagnosis not present

## 2021-05-14 DIAGNOSIS — S29011A Strain of muscle and tendon of front wall of thorax, initial encounter: Secondary | ICD-10-CM | POA: Diagnosis not present

## 2021-05-14 DIAGNOSIS — Z510 Encounter for antineoplastic radiation therapy: Secondary | ICD-10-CM | POA: Diagnosis not present

## 2021-05-14 DIAGNOSIS — R091 Pleurisy: Secondary | ICD-10-CM | POA: Diagnosis not present

## 2021-05-14 DIAGNOSIS — E1165 Type 2 diabetes mellitus with hyperglycemia: Secondary | ICD-10-CM | POA: Diagnosis not present

## 2021-05-14 DIAGNOSIS — R071 Chest pain on breathing: Secondary | ICD-10-CM | POA: Diagnosis not present

## 2021-05-14 DIAGNOSIS — Z885 Allergy status to narcotic agent status: Secondary | ICD-10-CM | POA: Diagnosis not present

## 2021-05-14 DIAGNOSIS — I10 Essential (primary) hypertension: Secondary | ICD-10-CM | POA: Diagnosis not present

## 2021-05-14 DIAGNOSIS — C50212 Malignant neoplasm of upper-inner quadrant of left female breast: Secondary | ICD-10-CM | POA: Diagnosis not present

## 2021-05-14 DIAGNOSIS — J9811 Atelectasis: Secondary | ICD-10-CM | POA: Diagnosis not present

## 2021-05-14 DIAGNOSIS — Z170 Estrogen receptor positive status [ER+]: Secondary | ICD-10-CM | POA: Diagnosis not present

## 2021-05-14 DIAGNOSIS — Z20822 Contact with and (suspected) exposure to covid-19: Secondary | ICD-10-CM | POA: Diagnosis not present

## 2021-05-15 DIAGNOSIS — Z510 Encounter for antineoplastic radiation therapy: Secondary | ICD-10-CM | POA: Diagnosis not present

## 2021-05-15 DIAGNOSIS — C50212 Malignant neoplasm of upper-inner quadrant of left female breast: Secondary | ICD-10-CM | POA: Diagnosis not present

## 2021-05-15 DIAGNOSIS — Z170 Estrogen receptor positive status [ER+]: Secondary | ICD-10-CM | POA: Diagnosis not present

## 2021-05-16 DIAGNOSIS — Z510 Encounter for antineoplastic radiation therapy: Secondary | ICD-10-CM | POA: Diagnosis not present

## 2021-05-16 DIAGNOSIS — Z170 Estrogen receptor positive status [ER+]: Secondary | ICD-10-CM | POA: Diagnosis not present

## 2021-05-16 DIAGNOSIS — C50212 Malignant neoplasm of upper-inner quadrant of left female breast: Secondary | ICD-10-CM | POA: Diagnosis not present

## 2021-05-17 DIAGNOSIS — Z510 Encounter for antineoplastic radiation therapy: Secondary | ICD-10-CM | POA: Diagnosis not present

## 2021-05-17 DIAGNOSIS — Z170 Estrogen receptor positive status [ER+]: Secondary | ICD-10-CM | POA: Diagnosis not present

## 2021-05-17 DIAGNOSIS — C50212 Malignant neoplasm of upper-inner quadrant of left female breast: Secondary | ICD-10-CM | POA: Diagnosis not present

## 2021-05-20 DIAGNOSIS — C50212 Malignant neoplasm of upper-inner quadrant of left female breast: Secondary | ICD-10-CM | POA: Diagnosis not present

## 2021-05-20 DIAGNOSIS — Z510 Encounter for antineoplastic radiation therapy: Secondary | ICD-10-CM | POA: Diagnosis not present

## 2021-05-20 DIAGNOSIS — Z170 Estrogen receptor positive status [ER+]: Secondary | ICD-10-CM | POA: Diagnosis not present

## 2021-05-21 ENCOUNTER — Other Ambulatory Visit: Payer: Self-pay | Admitting: Hematology and Oncology

## 2021-05-21 ENCOUNTER — Other Ambulatory Visit: Payer: Self-pay | Admitting: General Surgery

## 2021-05-21 DIAGNOSIS — N6489 Other specified disorders of breast: Secondary | ICD-10-CM

## 2021-05-21 DIAGNOSIS — Z170 Estrogen receptor positive status [ER+]: Secondary | ICD-10-CM | POA: Diagnosis not present

## 2021-05-21 DIAGNOSIS — Z510 Encounter for antineoplastic radiation therapy: Secondary | ICD-10-CM | POA: Diagnosis not present

## 2021-05-21 DIAGNOSIS — C50212 Malignant neoplasm of upper-inner quadrant of left female breast: Secondary | ICD-10-CM | POA: Diagnosis not present

## 2021-05-22 ENCOUNTER — Encounter (HOSPITAL_COMMUNITY): Payer: Self-pay

## 2021-05-22 DIAGNOSIS — C50212 Malignant neoplasm of upper-inner quadrant of left female breast: Secondary | ICD-10-CM | POA: Diagnosis not present

## 2021-05-22 DIAGNOSIS — Z170 Estrogen receptor positive status [ER+]: Secondary | ICD-10-CM | POA: Diagnosis not present

## 2021-05-22 DIAGNOSIS — Z510 Encounter for antineoplastic radiation therapy: Secondary | ICD-10-CM | POA: Diagnosis not present

## 2021-05-23 ENCOUNTER — Ambulatory Visit
Admission: RE | Admit: 2021-05-23 | Discharge: 2021-05-23 | Disposition: A | Discharge status: Home / Self Care | Payer: Medicare Other | Source: Ambulatory Visit | Attending: General Surgery | Admitting: General Surgery

## 2021-05-23 DIAGNOSIS — N644 Mastodynia: Secondary | ICD-10-CM | POA: Diagnosis not present

## 2021-05-23 DIAGNOSIS — N6489 Other specified disorders of breast: Secondary | ICD-10-CM

## 2021-05-23 DIAGNOSIS — C50212 Malignant neoplasm of upper-inner quadrant of left female breast: Secondary | ICD-10-CM | POA: Diagnosis not present

## 2021-05-23 DIAGNOSIS — L7682 Other postprocedural complications of skin and subcutaneous tissue: Secondary | ICD-10-CM | POA: Diagnosis not present

## 2021-05-23 DIAGNOSIS — Z510 Encounter for antineoplastic radiation therapy: Secondary | ICD-10-CM | POA: Diagnosis not present

## 2021-05-23 DIAGNOSIS — Z170 Estrogen receptor positive status [ER+]: Secondary | ICD-10-CM | POA: Diagnosis not present

## 2021-05-24 DIAGNOSIS — Z170 Estrogen receptor positive status [ER+]: Secondary | ICD-10-CM | POA: Diagnosis not present

## 2021-05-24 DIAGNOSIS — C50212 Malignant neoplasm of upper-inner quadrant of left female breast: Secondary | ICD-10-CM | POA: Diagnosis not present

## 2021-05-24 DIAGNOSIS — Z510 Encounter for antineoplastic radiation therapy: Secondary | ICD-10-CM | POA: Diagnosis not present

## 2021-05-27 DIAGNOSIS — C50212 Malignant neoplasm of upper-inner quadrant of left female breast: Secondary | ICD-10-CM | POA: Diagnosis not present

## 2021-05-27 DIAGNOSIS — Z170 Estrogen receptor positive status [ER+]: Secondary | ICD-10-CM | POA: Diagnosis not present

## 2021-05-27 DIAGNOSIS — Z510 Encounter for antineoplastic radiation therapy: Secondary | ICD-10-CM | POA: Diagnosis not present

## 2021-05-28 DIAGNOSIS — Z510 Encounter for antineoplastic radiation therapy: Secondary | ICD-10-CM | POA: Diagnosis not present

## 2021-05-28 DIAGNOSIS — C50212 Malignant neoplasm of upper-inner quadrant of left female breast: Secondary | ICD-10-CM | POA: Diagnosis not present

## 2021-05-28 DIAGNOSIS — Z170 Estrogen receptor positive status [ER+]: Secondary | ICD-10-CM | POA: Diagnosis not present

## 2021-05-29 DIAGNOSIS — Z170 Estrogen receptor positive status [ER+]: Secondary | ICD-10-CM | POA: Diagnosis not present

## 2021-05-29 DIAGNOSIS — Z510 Encounter for antineoplastic radiation therapy: Secondary | ICD-10-CM | POA: Diagnosis not present

## 2021-05-29 DIAGNOSIS — C50212 Malignant neoplasm of upper-inner quadrant of left female breast: Secondary | ICD-10-CM | POA: Diagnosis not present

## 2021-05-30 DIAGNOSIS — C50212 Malignant neoplasm of upper-inner quadrant of left female breast: Secondary | ICD-10-CM | POA: Diagnosis not present

## 2021-05-30 DIAGNOSIS — Z170 Estrogen receptor positive status [ER+]: Secondary | ICD-10-CM | POA: Diagnosis not present

## 2021-05-30 DIAGNOSIS — Z510 Encounter for antineoplastic radiation therapy: Secondary | ICD-10-CM | POA: Diagnosis not present

## 2021-05-31 DIAGNOSIS — C50212 Malignant neoplasm of upper-inner quadrant of left female breast: Secondary | ICD-10-CM | POA: Diagnosis not present

## 2021-05-31 DIAGNOSIS — Z170 Estrogen receptor positive status [ER+]: Secondary | ICD-10-CM | POA: Diagnosis not present

## 2021-05-31 DIAGNOSIS — Z510 Encounter for antineoplastic radiation therapy: Secondary | ICD-10-CM | POA: Diagnosis not present

## 2021-06-03 DIAGNOSIS — Z510 Encounter for antineoplastic radiation therapy: Secondary | ICD-10-CM | POA: Diagnosis not present

## 2021-06-03 DIAGNOSIS — Z170 Estrogen receptor positive status [ER+]: Secondary | ICD-10-CM | POA: Diagnosis not present

## 2021-06-03 DIAGNOSIS — C50212 Malignant neoplasm of upper-inner quadrant of left female breast: Secondary | ICD-10-CM | POA: Diagnosis not present

## 2021-06-04 DIAGNOSIS — Z510 Encounter for antineoplastic radiation therapy: Secondary | ICD-10-CM | POA: Diagnosis not present

## 2021-06-04 DIAGNOSIS — Z170 Estrogen receptor positive status [ER+]: Secondary | ICD-10-CM | POA: Diagnosis not present

## 2021-06-04 DIAGNOSIS — C50212 Malignant neoplasm of upper-inner quadrant of left female breast: Secondary | ICD-10-CM | POA: Diagnosis not present

## 2021-06-05 DIAGNOSIS — Z510 Encounter for antineoplastic radiation therapy: Secondary | ICD-10-CM | POA: Diagnosis not present

## 2021-06-05 DIAGNOSIS — C50212 Malignant neoplasm of upper-inner quadrant of left female breast: Secondary | ICD-10-CM | POA: Diagnosis not present

## 2021-06-05 DIAGNOSIS — Z170 Estrogen receptor positive status [ER+]: Secondary | ICD-10-CM | POA: Diagnosis not present

## 2021-06-06 DIAGNOSIS — C50212 Malignant neoplasm of upper-inner quadrant of left female breast: Secondary | ICD-10-CM | POA: Diagnosis not present

## 2021-06-06 DIAGNOSIS — Z510 Encounter for antineoplastic radiation therapy: Secondary | ICD-10-CM | POA: Diagnosis not present

## 2021-06-06 DIAGNOSIS — Z170 Estrogen receptor positive status [ER+]: Secondary | ICD-10-CM | POA: Diagnosis not present

## 2021-06-07 DIAGNOSIS — Z510 Encounter for antineoplastic radiation therapy: Secondary | ICD-10-CM | POA: Diagnosis not present

## 2021-06-07 DIAGNOSIS — Z170 Estrogen receptor positive status [ER+]: Secondary | ICD-10-CM | POA: Diagnosis not present

## 2021-06-07 DIAGNOSIS — C50212 Malignant neoplasm of upper-inner quadrant of left female breast: Secondary | ICD-10-CM | POA: Diagnosis not present

## 2021-06-10 DIAGNOSIS — C50212 Malignant neoplasm of upper-inner quadrant of left female breast: Secondary | ICD-10-CM | POA: Diagnosis not present

## 2021-06-10 DIAGNOSIS — Z510 Encounter for antineoplastic radiation therapy: Secondary | ICD-10-CM | POA: Diagnosis not present

## 2021-06-10 DIAGNOSIS — Z170 Estrogen receptor positive status [ER+]: Secondary | ICD-10-CM | POA: Diagnosis not present

## 2021-06-14 DIAGNOSIS — I10 Essential (primary) hypertension: Secondary | ICD-10-CM | POA: Diagnosis not present

## 2021-06-14 DIAGNOSIS — E1165 Type 2 diabetes mellitus with hyperglycemia: Secondary | ICD-10-CM | POA: Diagnosis not present

## 2021-06-17 ENCOUNTER — Ambulatory Visit: Payer: Self-pay

## 2021-07-05 ENCOUNTER — Telehealth: Payer: Self-pay | Admitting: Hematology and Oncology

## 2021-07-05 NOTE — Telephone Encounter (Signed)
.  Called patient to schedule appointment per 4/21 inbaset, patient is aware of date and time. Pt requested week of 5/8  ?

## 2021-07-08 ENCOUNTER — Encounter: Payer: Self-pay | Admitting: *Deleted

## 2021-07-09 NOTE — Progress Notes (Signed)
? ?Patient Care Team: ?Sasser, Silvestre Moment, MD as PCP - General (Family Medicine) ?Burnell Blanks, MD as PCP - Cardiology (Cardiology) ?Nicholas Lose, MD as Consulting Physician (Hematology and Oncology) ?Jovita Kussmaul, MD as Consulting Physician (General Surgery) ?Mauro Kaufmann, RN as Oncology Nurse Navigator ?Rockwell Germany, RN as Oncology Nurse Navigator ? ?DIAGNOSIS:  ?Encounter Diagnosis  ?Name Primary?  ? Ductal carcinoma in situ (DCIS) of right breast   ? ? ?SUMMARY OF ONCOLOGIC HISTORY: ?Oncology History  ?Ductal carcinoma in situ (DCIS) of right breast  ?12/27/2018 Initial Diagnosis  ? Patient noted bilateral dark red spontaneous nipple discharge and a left breast lump palpable on exam. Mammogram showed calcifications in the right breast spanning 3.52m and no correlate for the previously palpable left breast lump. Breast MRI showed no evidence of malignancy bilaterally. Right breast biopsy showed intermediate grade DCIS, ER+ 100%, PR+ 70%.  ?  ?03/01/2019 Genetic Testing  ? Negative genetic testing:  No pathogenic variants detected on the [employer] Breast Cancer STAT panel or the [employer] Multi-Cancer panel. A variant of uncertain significance was detected in the APC gene called c.449A>G. The report date is 03/01/2019. ? ?The Breast Cancer STAT panel offered by [facility] includes sequencing and rearrangement analysis for the following 9 genes:  ATM, BRCA1, BRCA2, CDH1, CHEK2, PALB2, PTEN, STK11 and TP53.  The Multi-Cancer Panel offered by [facility] includes sequencing and/or deletion duplication testing of the following 85 genes: AIP, ALK, APC, ATM, AXIN2,BAP1,  BARD1, BLM, BMPR1A, BRCA1, BRCA2, BRIP1, CASR, CDC73, CDH1, CDK4, CDKN1B, CDKN1C, CDKN2A (p14ARF), CDKN2A (p16INK4a), CEBPA, CHEK2, CTNNA1, DICER1, DIS3L2, EGFR (c.2369C>T, p.Thr790Met variant only), EPCAM (Deletion/duplication testing only), FH, FLCN, GATA2, GPC3, GREM1 (Promoter region deletion/duplication testing only), HOXB13 (c.251G>A,  p.Gly84Glu), HRAS, KIT, MAX, MEN1, MET, MITF (c.952G>A, p.Glu318Lys variant only), MLH1, MSH2, MSH3, MSH6, MUTYH, NBN, NF1, NF2, NTHL1, PALB2, PDGFRA, PHOX2B, PMS2, POLD1, POLE, POT1, PRKAR1A, PTCH1, PTEN, RAD50, RAD51C, RAD51D, RB1, RECQL4, RET, RNF43, RUNX1, SDHAF2, SDHA (sequence changes only), SDHB, SDHC, SDHD, SMAD4, SMARCA4, SMARCB1, SMARCE1, STK11, SUFU, TERC, TERT, TMEM127, TP53, TSC1, TSC2, VHL, WRN and WT1.   ?  ?04/28/2019 Cancer Staging  ? Staging form: Breast, AJCC 8th Edition ?- Pathologic stage from 04/28/2019: Stage 0 (pTis (DCIS), pN0, cM0, ER+, PR+) ?  ?04/28/2019 Surgery  ? Right lumpectomy ([name] [phone]: intermediate grade DCIS, clear margins. No regional lymph nodes were examined. ?  ?06/16/2019 - 07/11/2019 [facility]  [facility] ?  ?07/2019 - 07/2024 Anti-estrogen oral therapy  ? Tamoxifen ?  ?03/22/2021 Relapse/Recurrence  ? Left Breast Biopsy: Grade 1-2 IDC (0.5 cm) ?Left Lumpectomy: 1.5 cm IG DCIS 0/1 LN Neg, ER 100%, PR 100%, Her 2 Neg, Ki 67: 15%, (no residual IDC) ?  ?06/13/2021 - 07/11/2021 Radiation Therapy  ? Adj XRT ?  ? ?CHIEF COMPLIANT: Follow-up after recent surgery for left breast cancer ? ?INTERVAL HISTORY: LLENNIX ROTUNDOis a 71y.o. with above-mentioned history of right breast DCIS having undergone lumpectomy and radiation. She presents to the clinic today for a follow-up. She states she just be really tired. She states skin is back to normal complains of nipple been tender and she get shooting pain. She also complains of a knot under nipple. Complains of fluid build up. She also states that she feels knots in her left arm. ? ? ?ALLERGIES:  is allergic to oxycodone-acetaminophen, propoxyphene, demerol [meperidine], morphine and related, oxycodone-aspirin, and chlorhexidine. ? ?MEDICATIONS:  ?Current Outpatient Medications  ?Medication Sig Dispense Refill  ?  Semaglutide,0.25 or 0.5MG/DOS, (OZEMPIC, 0.25 OR 0.5 MG/DOSE,) 2 MG/1.5ML SOPN Inject 0.5 mg into the skin  once a week.    ? aspirin 81 MG tablet Take 1 tablet by mouth daily.    ? Calcium Carbonate-Vit D-Min (CALCIUM 1200 PO) Take 1,200 mg by mouth 2 (two) times daily.    ? Cholecalciferol (VITAMIN D-3) 25 MCG (1000 UT) CAPS Take 3 capsules by mouth daily.    ? insulin aspart (NOVOLOG) 100 UNIT/ML injection Inject 5-15 Units into the skin 3 (three) times daily with meals. 5-15 units on sliding scale     ? insulin glargine (LANTUS) 100 UNIT/ML injection Inject 60 Units into the skin at bedtime.     ? letrozole (FEMARA) 2.5 MG tablet Take 1 tablet (2.5 mg total) by mouth daily. 90 tablet 3  ? lisinopril-hydrochlorothiazide (ZESTORETIC) 10-12.5 MG tablet Take 1 tablet by mouth daily.    ? Multiple Vitamins-Minerals (MULTIVITAMIN ADULT PO) Take by mouth daily.    ? Pediatric Multivitamins-Iron Vicki Mallet W/IRON PO) Take 2 tablets by mouth daily.    ? venlafaxine XR (EFFEXOR-XR) 75 MG 24 hr capsule Take 75 mg by mouth daily.    ? vitamin B-12 (CYANOCOBALAMIN) 500 MCG tablet Take 1,000 mcg by mouth 2 (two) times daily.    ? ?No current facility-administered medications for this visit.  ? ? ?PHYSICAL EXAMINATION: ?ECOG PERFORMANCE STATUS: 1 - Symptomatic but completely ambulatory ? ?Vitals:  ? 07/23/21 1041  ?BP: (!) 155/75  ?Pulse: 84  ?Resp: 18  ?Temp: 97.9 ?F (36.6 ?C)  ?SpO2: 98%  ? ?Filed Weights  ? 07/23/21 1041  ?Weight: 209 lb 9.6 oz (95.1 kg)  ? ? ?BREAST: No palpable masses or nodules in either right or left breasts. No palpable axillary supraclavicular or infraclavicular adenopathy no breast tenderness or nipple discharge. (exam performed in the presence of a chaperone) ? ?LABORATORY DATA:  ?I have reviewed the data as listed ? ?  Latest Ref Rng & Units 03/15/2021  ?  9:32 AM 04/25/2019  ?  9:30 AM 02/22/2019  ?  2:59 PM  ?CMP  ?Glucose 70 - 99 mg/dL 192   148   219    ?BUN 8 - 23 mg/dL 16   22   17     ?Creatinine 0.44 - 1.00 mg/dL 0.74   0.76   0.80    ?Sodium 135 - 145 mmol/L 137   137   138    ?Potassium 3.5 - 5.1  mmol/L 4.3   4.3   4.3    ?Chloride 98 - 111 mmol/L 101   105   103    ?CO2 22 - 32 mmol/L 27   23   26     ?Calcium 8.9 - 10.3 mg/dL 9.0   9.0   9.0    ?Total Protein 6.5 - 8.1 g/dL   7.2    ?Total Bilirubin 0.3 - 1.2 mg/dL   0.5    ?Alkaline Phos 38 - 126 U/L   69    ?AST 15 - 41 U/L   13    ?ALT 0 - 44 U/L   12    ? ? ?Lab Results  ?Component Value Date  ? WBC 7.7 02/22/2019  ? HGB 14.5 02/22/2019  ? HCT 43.4 02/22/2019  ? MCV 94.6 02/22/2019  ? PLT 246 02/22/2019  ? NEUTROABS 4.1 02/22/2019  ? ? ?ASSESSMENT & PLAN:  ?Ductal carcinoma in situ (DCIS) of right breast ?12/27/2018:Patient noted bilateral dark red spontaneous nipple discharge  and a left breast lump palpable on exam. Mammogram showed calcifications in the right breast spanning 3.41m and no correlate for the previously palpable left breast lump. Breast MRI showed no evidence of malignancy bilaterally. Right breast biopsy showed intermediate grade DCIS, ER+ 100%, PR+ 70%.  ?  ?04/28/2019: Right lumpectomy ([name]: intermediate grade DCIS, clear margins ER 100%, PR 70% ?[facility]  ?Plan: Adjuvant antiestrogen therapy with tamoxifen 20 mg daily x5 years was recommended but she did not take it. ?  ?  ?Malignant neoplasm involving both nipple and areola of left breast in female, estrogen receptor negative ([place] ?Left Breast Biopsy: Grade 1-2 IDC (0.5 cm) ?Left Lumpectomy: 1.5 cm IG DCIS 0/1 LN Neg, ER 100%, PR 100%, Her 2 Neg, Ki 67: 15%, (no residual IDC) ?  ?Recommendations: ?1. Adjuvant radiation therapy ?2. Adjuvant antiestrogen therapy: recommend Letrozole ?  ?Return to clinic in 3 months for survivorship care plan visit ? ? ? ?No orders of the defined types were placed in this encounter. ? ?The patient has a good understanding of the overall plan. she agrees with it. she will call with any problems that may develop before the next visit here. ?Total time spent: 30 mins including face to face time and time spent for planning, charting  and co-ordination of care ? ? VHarriette Ohara MD ?07/23/21 ? ? ? I DGardiner Coinsam scribing for Dr. GLindi Adie? ?I have reviewed the above documentation for accuracy and completeness, and I agree with the

## 2021-07-12 DIAGNOSIS — C50212 Malignant neoplasm of upper-inner quadrant of left female breast: Secondary | ICD-10-CM | POA: Diagnosis not present

## 2021-07-12 DIAGNOSIS — Z170 Estrogen receptor positive status [ER+]: Secondary | ICD-10-CM | POA: Diagnosis not present

## 2021-07-12 DIAGNOSIS — Z510 Encounter for antineoplastic radiation therapy: Secondary | ICD-10-CM | POA: Diagnosis not present

## 2021-07-23 ENCOUNTER — Other Ambulatory Visit (HOSPITAL_COMMUNITY): Payer: Self-pay

## 2021-07-23 ENCOUNTER — Other Ambulatory Visit: Payer: Self-pay

## 2021-07-23 ENCOUNTER — Inpatient Hospital Stay: Payer: Medicare Other | Attending: Hematology and Oncology | Admitting: Hematology and Oncology

## 2021-07-23 ENCOUNTER — Other Ambulatory Visit: Payer: Self-pay | Admitting: *Deleted

## 2021-07-23 DIAGNOSIS — Z923 Personal history of irradiation: Secondary | ICD-10-CM | POA: Insufficient documentation

## 2021-07-23 DIAGNOSIS — C50012 Malignant neoplasm of nipple and areola, left female breast: Secondary | ICD-10-CM | POA: Insufficient documentation

## 2021-07-23 DIAGNOSIS — Z171 Estrogen receptor negative status [ER-]: Secondary | ICD-10-CM | POA: Diagnosis not present

## 2021-07-23 DIAGNOSIS — D0511 Intraductal carcinoma in situ of right breast: Secondary | ICD-10-CM

## 2021-07-23 DIAGNOSIS — Z79811 Long term (current) use of aromatase inhibitors: Secondary | ICD-10-CM | POA: Insufficient documentation

## 2021-07-23 MED ORDER — OZEMPIC (0.25 OR 0.5 MG/DOSE) 2 MG/1.5ML ~~LOC~~ SOPN: 0.5000 mg | PEN_INJECTOR | SUBCUTANEOUS | Status: DC

## 2021-07-23 MED ORDER — LETROZOLE 2.5 MG PO TABS: 2.5000 mg | ORAL_TABLET | Freq: Every day | ORAL | 3 refills | Status: DC

## 2021-07-23 MED ORDER — LETROZOLE 2.5 MG PO TABS
2.5000 mg | ORAL_TABLET | Freq: Every day | ORAL | 3 refills | Status: DC
  Filled 2021-07-23: qty 90, 90d supply, fill #0

## 2021-07-23 NOTE — Assessment & Plan Note (Signed)
12/27/2018:Patient noted bilateral dark red spontaneous nipple discharge and a left breast lump palpable on exam. Mammogram showed calcifications in the right breast spanning 3.86m and no correlate for the previously palpable left breast lump. Breast MRI showed no evidence of malignancy bilaterally. Right breast biopsy showed intermediate grade DCIS, ER+ 100%, PR+ 70%.? ?? ?04/28/2019:?Right lumpectomy ([name]: intermediate grade DCIS, clear margins?ER 100%, PR 70% ?[facility] ?Plan: Adjuvant antiestrogen therapy with tamoxifen 20 mg daily x5 years was recommended but she did not take it. ?? ?? ?Malignant neoplasm involving both nipple and areola of left breast in [place], [place] ?Left Breast Biopsy: Grade 1-2 IDC (0.5 cm) ?Left Lumpectomy: 1.5 cm IG DCIS 0/1 LN Neg, ER 100%, PR 100%, Her 2 Neg, Ki 67: 15%, (no residual IDC) ?? ?Recommendations: ?1. Adjuvant radiation therapy ?2. Adjuvant antiestrogen therapy: recommend Letrozole ?? ?Return to clinic in 3 months for survivorship care plan visit ?

## 2021-07-24 ENCOUNTER — Encounter: Payer: Self-pay | Admitting: *Deleted

## 2021-07-24 DIAGNOSIS — D0511 Intraductal carcinoma in situ of right breast: Secondary | ICD-10-CM

## 2021-07-29 ENCOUNTER — Other Ambulatory Visit (HOSPITAL_COMMUNITY): Payer: Self-pay

## 2021-08-14 DIAGNOSIS — E782 Mixed hyperlipidemia: Secondary | ICD-10-CM | POA: Diagnosis not present

## 2021-08-14 DIAGNOSIS — E1165 Type 2 diabetes mellitus with hyperglycemia: Secondary | ICD-10-CM | POA: Diagnosis not present

## 2021-08-14 DIAGNOSIS — I10 Essential (primary) hypertension: Secondary | ICD-10-CM | POA: Diagnosis not present

## 2021-08-20 DIAGNOSIS — K120 Recurrent oral aphthae: Secondary | ICD-10-CM | POA: Diagnosis not present

## 2021-08-26 DIAGNOSIS — E782 Mixed hyperlipidemia: Secondary | ICD-10-CM | POA: Diagnosis not present

## 2021-08-26 DIAGNOSIS — E1122 Type 2 diabetes mellitus with diabetic chronic kidney disease: Secondary | ICD-10-CM | POA: Diagnosis not present

## 2021-08-26 DIAGNOSIS — E1165 Type 2 diabetes mellitus with hyperglycemia: Secondary | ICD-10-CM | POA: Diagnosis not present

## 2021-08-26 DIAGNOSIS — I10 Essential (primary) hypertension: Secondary | ICD-10-CM | POA: Diagnosis not present

## 2021-08-29 DIAGNOSIS — C50919 Malignant neoplasm of unspecified site of unspecified female breast: Secondary | ICD-10-CM | POA: Diagnosis not present

## 2021-08-29 DIAGNOSIS — E1165 Type 2 diabetes mellitus with hyperglycemia: Secondary | ICD-10-CM | POA: Diagnosis not present

## 2021-08-29 DIAGNOSIS — E7849 Other hyperlipidemia: Secondary | ICD-10-CM | POA: Diagnosis not present

## 2021-08-29 DIAGNOSIS — I10 Essential (primary) hypertension: Secondary | ICD-10-CM | POA: Diagnosis not present

## 2021-08-29 DIAGNOSIS — M1810 Unilateral primary osteoarthritis of first carpometacarpal joint, unspecified hand: Secondary | ICD-10-CM | POA: Diagnosis not present

## 2021-09-02 ENCOUNTER — Ambulatory Visit
Admission: RE | Admit: 2021-09-02 | Discharge: 2021-09-02 | Disposition: A | Discharge status: Home / Self Care | Payer: Medicare Other | Source: Ambulatory Visit | Attending: Hematology and Oncology | Admitting: Hematology and Oncology

## 2021-09-02 ENCOUNTER — Telehealth: Payer: Self-pay

## 2021-09-02 DIAGNOSIS — M85852 Other specified disorders of bone density and structure, left thigh: Secondary | ICD-10-CM | POA: Diagnosis not present

## 2021-09-02 DIAGNOSIS — Z780 Asymptomatic menopausal state: Secondary | ICD-10-CM | POA: Diagnosis not present

## 2021-09-02 NOTE — Telephone Encounter (Signed)
-----   Message from Gardenia Phlegm, NP sent at 09/02/2021  8:55 AM EDT ----- Please let patient know that her mild osteopenia is improving.   ----- Message ----- From: Interface, Rad Results In Sent: 09/02/2021   8:17 AM EDT To: Nicholas Lose, MD

## 2021-09-02 NOTE — Telephone Encounter (Signed)
Attempted to call pt to review results. LVM for call back.

## 2021-09-02 NOTE — Telephone Encounter (Signed)
Pt returned call. Was informed of bone density results and advised pt to continue her VitD/Ca supplement. She verbalized thanks and understanding.

## 2021-09-03 DIAGNOSIS — Z0001 Encounter for general adult medical examination with abnormal findings: Secondary | ICD-10-CM | POA: Diagnosis not present

## 2021-09-10 DIAGNOSIS — C50212 Malignant neoplasm of upper-inner quadrant of left female breast: Secondary | ICD-10-CM | POA: Diagnosis not present

## 2021-09-10 DIAGNOSIS — Z170 Estrogen receptor positive status [ER+]: Secondary | ICD-10-CM | POA: Diagnosis not present

## 2021-09-16 ENCOUNTER — Other Ambulatory Visit: Payer: Self-pay | Admitting: General Surgery

## 2021-09-16 DIAGNOSIS — Z170 Estrogen receptor positive status [ER+]: Secondary | ICD-10-CM

## 2021-09-27 IMAGING — MG DIGITAL DIAGNOSTIC BILAT W/ TOMO W/ CAD
6 of 9 series · 6 of 25 positions shown · non-contrast
Comparison: Previous exam(s).

CLINICAL DATA: 70-year-old female for annual follow-up. History of
RIGHT breast cancer and lumpectomy in 1716.

EXAM:
DIGITAL DIAGNOSTIC BILATERAL MAMMOGRAM WITH CAD AND TOMO

[R CC]
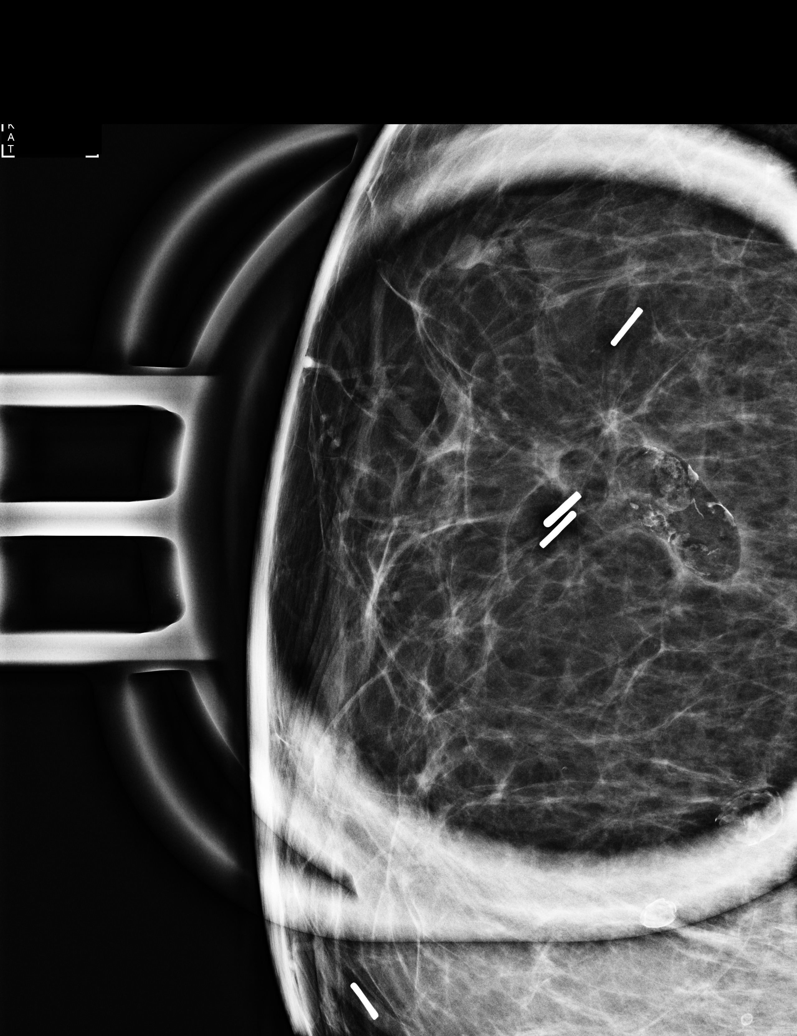

[R CC synth-2D]
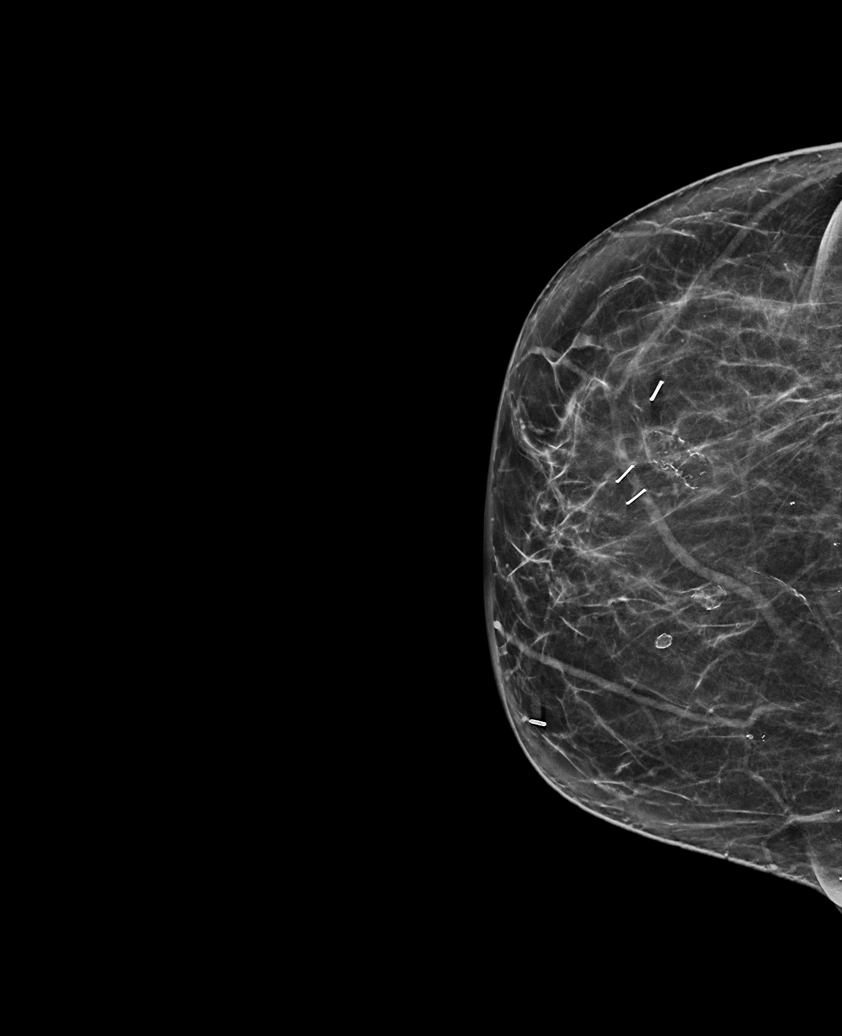

[R MLO synth-2D]
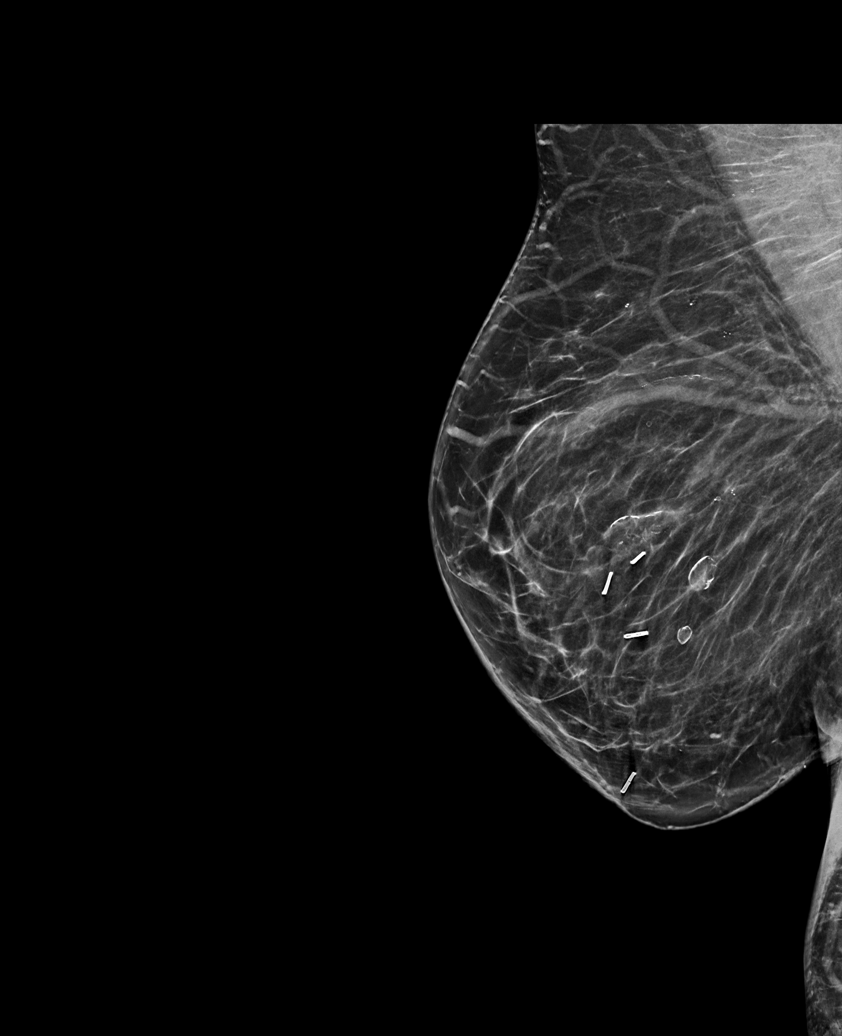

[L MLO synth-2D]
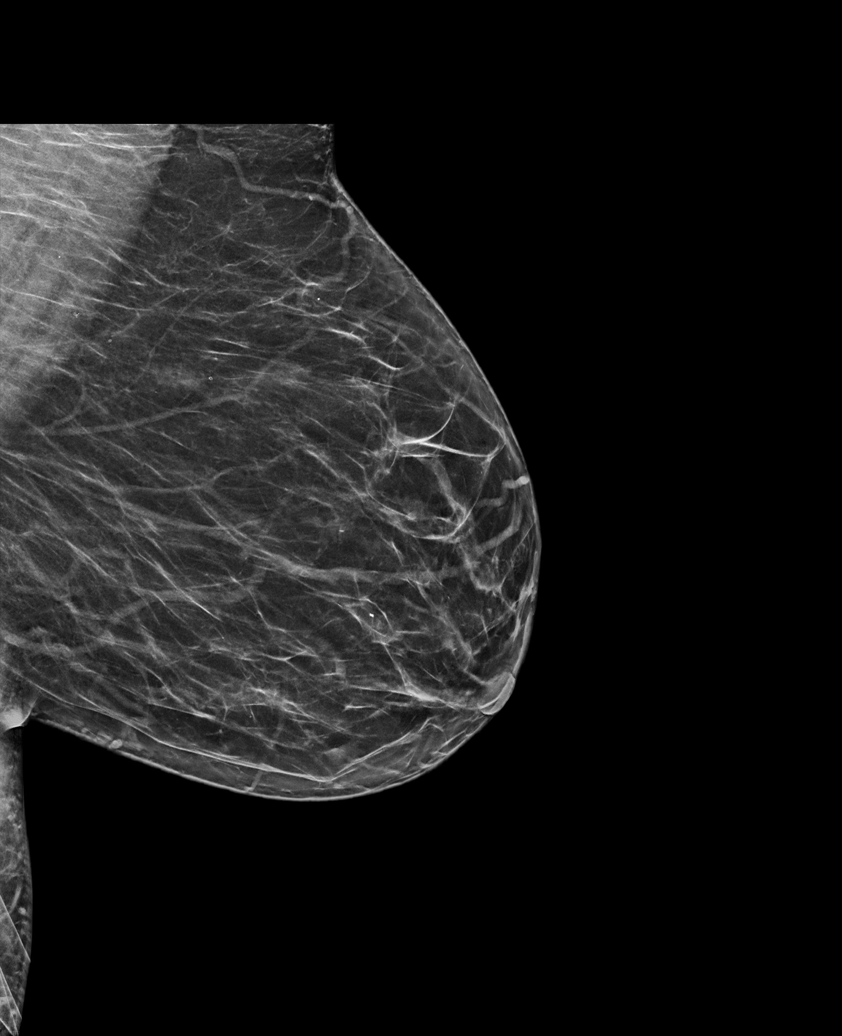

[L CC synth-2D]
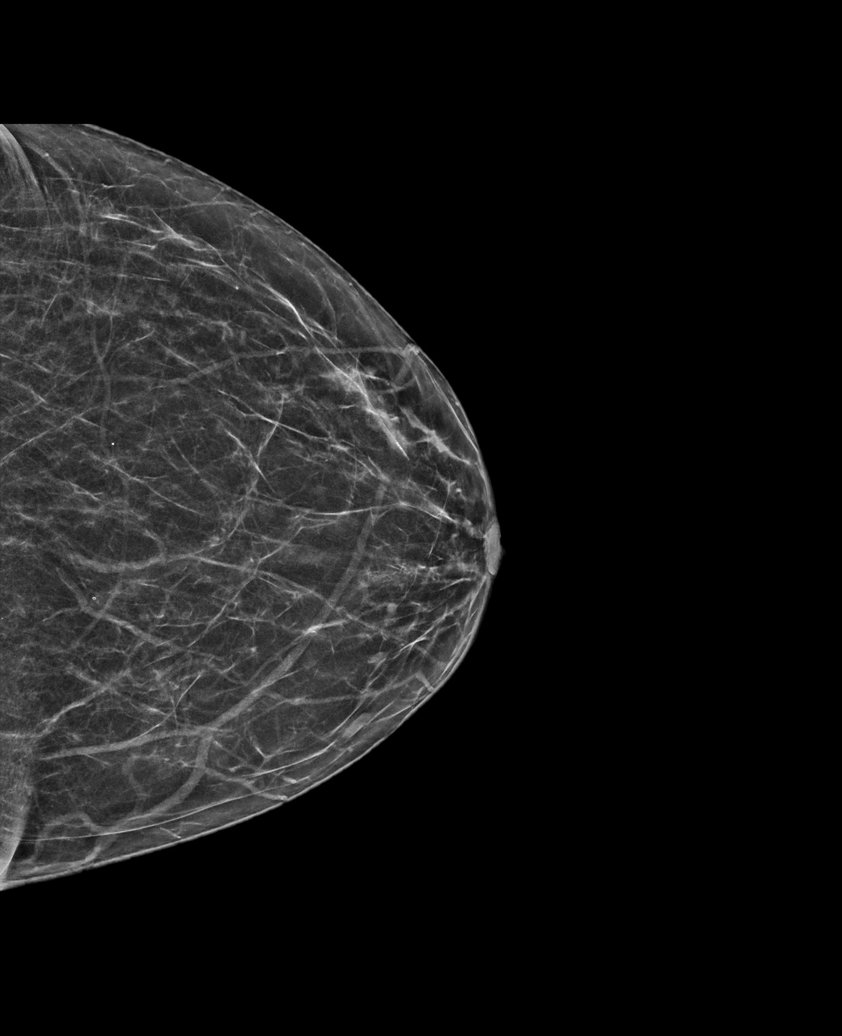

[L CC tomo · tomo slice 28/55.0]
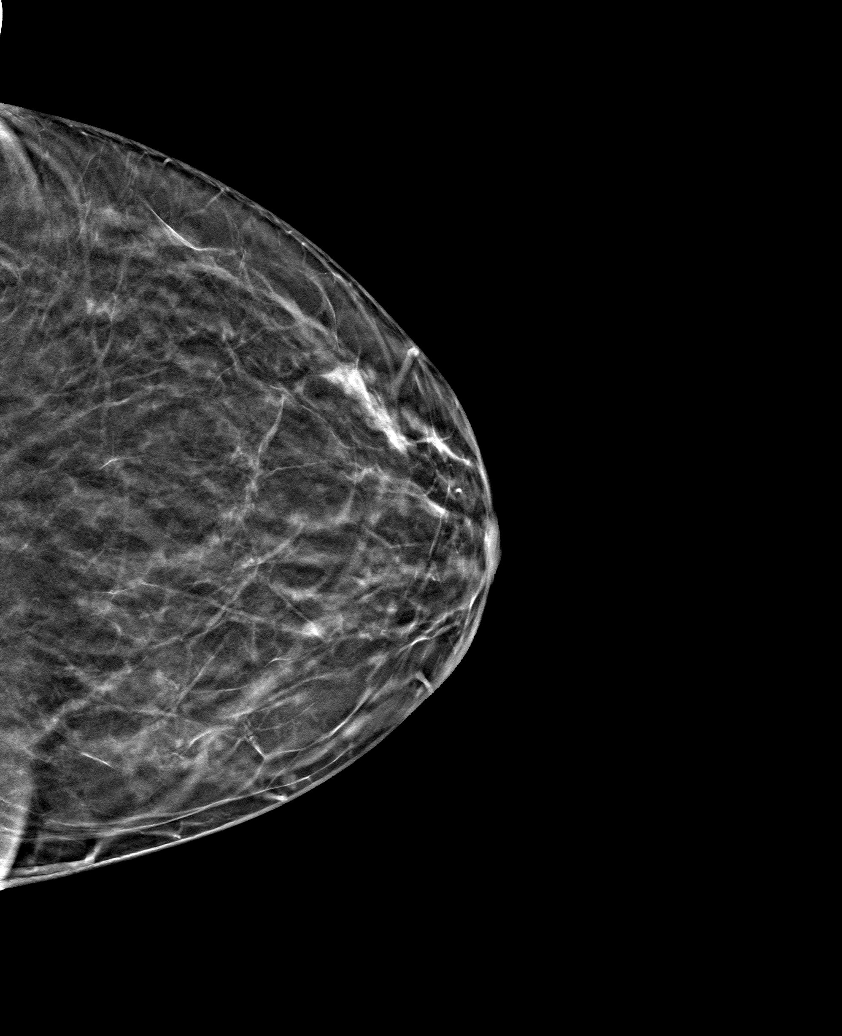

[6 of 25 positions shown; findings below may reference images not displayed]

ACR Breast Density Category b: There are scattered areas of
fibroglandular density.
FINDINGS: 2D and 3D full field views of both breasts and a magnification view
of the lumpectomy site demonstrate no suspicious mass, nonsurgical
distortion or worrisome calcifications.

Lumpectomy changes with dystrophic calcifications in the RIGHT
breast noted.

Mammographic images were processed with CAD.
IMPRESSION: No evidence of breast malignancy.

RECOMMENDATION:
Bilateral diagnostic mammogram in 1 year.

I have discussed the findings and recommendations with the patient.
If applicable, a reminder letter will be sent to the patient
regarding the next appointment.

BI-RADS CATEGORY  2: Benign.

## 2021-10-18 ENCOUNTER — Encounter: Payer: Self-pay | Admitting: *Deleted

## 2021-10-23 ENCOUNTER — Other Ambulatory Visit: Payer: Self-pay

## 2021-10-23 ENCOUNTER — Inpatient Hospital Stay: Payer: Medicare Other | Attending: Hematology and Oncology | Admitting: Adult Health

## 2021-10-23 ENCOUNTER — Encounter: Payer: Self-pay | Admitting: Adult Health

## 2021-10-23 ENCOUNTER — Inpatient Hospital Stay: Payer: Medicare Other

## 2021-10-23 ENCOUNTER — Telehealth: Payer: Self-pay

## 2021-10-23 VITALS — BP 165/73 | HR 89 | Temp 97.3°F | Resp 17 | Ht 59.0 in | Wt 208.4 lb

## 2021-10-23 DIAGNOSIS — C50012 Malignant neoplasm of nipple and areola, left female breast: Secondary | ICD-10-CM | POA: Insufficient documentation

## 2021-10-23 DIAGNOSIS — R5383 Other fatigue: Secondary | ICD-10-CM | POA: Diagnosis not present

## 2021-10-23 DIAGNOSIS — M8580 Other specified disorders of bone density and structure, unspecified site: Secondary | ICD-10-CM | POA: Diagnosis not present

## 2021-10-23 DIAGNOSIS — E559 Vitamin D deficiency, unspecified: Secondary | ICD-10-CM | POA: Diagnosis not present

## 2021-10-23 DIAGNOSIS — E538 Deficiency of other specified B group vitamins: Secondary | ICD-10-CM

## 2021-10-23 DIAGNOSIS — Z79811 Long term (current) use of aromatase inhibitors: Secondary | ICD-10-CM | POA: Diagnosis not present

## 2021-10-23 DIAGNOSIS — Z171 Estrogen receptor negative status [ER-]: Secondary | ICD-10-CM | POA: Insufficient documentation

## 2021-10-23 DIAGNOSIS — Z86000 Personal history of in-situ neoplasm of breast: Secondary | ICD-10-CM | POA: Insufficient documentation

## 2021-10-23 DIAGNOSIS — Z923 Personal history of irradiation: Secondary | ICD-10-CM | POA: Insufficient documentation

## 2021-10-23 LAB — CMP (CANCER CENTER ONLY)
ALT: 10 U/L (ref 0–44)
AST: 13 U/L — ABNORMAL LOW (ref 15–41)
Albumin: 4.1 g/dL (ref 3.5–5.0)
Alkaline Phosphatase: 76 U/L (ref 38–126)
Anion gap: 7 (ref 5–15)
BUN: 26 mg/dL — ABNORMAL HIGH (ref 8–23)
CO2: 30 mmol/L (ref 22–32)
Calcium: 9.6 mg/dL (ref 8.9–10.3)
Chloride: 101 mmol/L (ref 98–111)
Creatinine: 0.72 mg/dL (ref 0.44–1.00)
GFR, Estimated: >60 mL/min (ref >60)
Glucose, Bld: 186 mg/dL — ABNORMAL HIGH (ref 70–99)
Potassium: 4 mmol/L (ref 3.5–5.1)
Sodium: 138 mmol/L (ref 135–145)
Total Bilirubin: 0.5 mg/dL (ref 0.3–1.2)
Total Protein: 7.6 g/dL (ref 6.5–8.1)

## 2021-10-23 LAB — CBC WITH DIFFERENTIAL (CANCER CENTER ONLY)
Abs Immature Granulocytes: 0.03 10*3/uL (ref 0.00–0.07)
Basophils Absolute: 0.1 10*3/uL (ref 0.0–0.1)
Basophils Relative: 1 %
Eosinophils Absolute: 0 10*3/uL (ref 0.0–0.5)
Eosinophils Relative: 1 %
HCT: 41.4 % (ref 36.0–46.0)
Hemoglobin: 14.3 g/dL (ref 12.0–15.0)
Immature Granulocytes: 0 %
Lymphocytes Relative: 18 %
Lymphs Abs: 1.4 10*3/uL (ref 0.7–4.0)
MCH: 31.8 pg (ref 26.0–34.0)
MCHC: 34.5 g/dL (ref 30.0–36.0)
MCV: 92.2 fL (ref 80.0–100.0)
Monocytes Absolute: 0.7 10*3/uL (ref 0.1–1.0)
Monocytes Relative: 9 %
Neutro Abs: 5.5 10*3/uL (ref 1.7–7.7)
Neutrophils Relative %: 71 %
Platelet Count: 286 10*3/uL (ref 150–400)
RBC: 4.49 MIL/uL (ref 3.87–5.11)
RDW: 12.3 % (ref 11.5–15.5)
WBC Count: 7.7 10*3/uL (ref 4.0–10.5)
nRBC: 0 % (ref 0.0–0.2)

## 2021-10-23 LAB — IRON AND IRON BINDING CAPACITY (CC-WL,HP ONLY)
Iron: 116 ug/dL (ref 28–170)
Saturation Ratios: 38 % — ABNORMAL HIGH (ref 10.4–31.8)
TIBC: 309 ug/dL (ref 250–450)
UIBC: 193 ug/dL (ref 148–442)

## 2021-10-23 LAB — TSH: TSH: 1.198 u[IU]/mL (ref 0.350–4.500)

## 2021-10-23 LAB — VITAMIN D 25 HYDROXY (VIT D DEFICIENCY, FRACTURES): Vit D, 25-Hydroxy: 54.07 ng/mL (ref 30–100)

## 2021-10-23 LAB — FERRITIN: Ferritin: 291 ng/mL (ref 11–307)

## 2021-10-23 LAB — VITAMIN B12: Vitamin B-12: 362 pg/mL (ref 180–914)

## 2021-10-23 MED ORDER — KETOCONAZOLE 2 % EX CREA: 1.0000 | TOPICAL_CREAM | Freq: Every day | CUTANEOUS | 0 refills | Status: DC

## 2021-10-23 NOTE — Telephone Encounter (Signed)
-----   Message from Gardenia Phlegm, NP sent at 10/23/2021  3:38 PM EDT ----- Regarding: FW: All results normal.  Continue supplementation as she had been. ----- Message ----- From: Interface, Lab In [place] Sent: 10/23/2021  11:25 AM EDT To: Gardenia Phlegm, NP

## 2021-10-23 NOTE — Telephone Encounter (Signed)
Contacted pt to let her know her labs were normal per NP. He daughter will call  imaging tomorrow to get her set up for diagnostic MM she missed. Pt asks if she is supposed to have a CT abd/pelvis and states it was mentioned per Dr [name] but she is uncertain. Advised I would discuss this with NP and someone would be in touch to let her know. She verbalized thanks and understanding.

## 2021-10-23 NOTE — Telephone Encounter (Signed)
We did not talk about this.  I am happy to order if Dr. [name] wants it.  I will reach out.  Thanks, [place]

## 2021-10-23 NOTE — Progress Notes (Signed)
SURVIVORSHIP VISIT:  BRIEF ONCOLOGIC HISTORY:  Oncology History  Ductal carcinoma in situ (DCIS) of right breast  12/27/2018 Initial Diagnosis   Patient noted bilateral dark red spontaneous nipple discharge and a left breast lump palpable on exam. Mammogram showed calcifications in the right breast spanning 3.14m and no correlate for the previously palpable left breast lump. Breast MRI showed no evidence of malignancy bilaterally. Right breast biopsy showed intermediate grade DCIS, ER+ 100%, PR+ 70%.    03/01/2019 Genetic Testing   Negative genetic testing:  No pathogenic variants detected on the [facility] Breast Cancer STAT panel or the [facility]ae Multi-Cancer panel. A variant of uncertain significance was detected in the APC gene called c.449A>G. The report date is 03/01/2019.  The Breast Cancer STAT panel offered by [facility] includes sequencing and rearrangement analysis for the following 9 genes:  ATM, BRCA1, BRCA2, CDH1, CHEK2, PALB2, PTEN, STK11 and TP53.  The Multi-Cancer Panel offered by [facility] includes sequencing and/or deletion duplication testing of the following 85 genes: AIP, ALK, APC, ATM, AXIN2,BAP1,  BARD1, BLM, BMPR1A, BRCA1, BRCA2, BRIP1, CASR, CDC73, CDH1, CDK4, CDKN1B, CDKN1C, CDKN2A (p14ARF), CDKN2A (p16INK4a), CEBPA, CHEK2, CTNNA1, DICER1, DIS3L2, EGFR (c.2369C>T, p.Thr790Met variant only), EPCAM (Deletion/duplication testing only), FH, FLCN, GATA2, GPC3, GREM1 (Promoter region deletion/duplication testing only), HOXB13 (c.251G>A, p.Gly84Glu), HRAS, KIT, MAX, MEN1, MET, MITF (c.952G>A, p.Glu318Lys variant only), MLH1, MSH2, MSH3, MSH6, MUTYH, NBN, NF1, NF2, NTHL1, PALB2, PDGFRA, PHOX2B, PMS2, POLD1, POLE, POT1, PRKAR1A, PTCH1, PTEN, RAD50, RAD51C, RAD51D, RB1, RECQL4, RET, RNF43, RUNX1, SDHAF2, SDHA (sequence changes only), SDHB, SDHC, SDHD, SMAD4, SMARCA4, SMARCB1, SMARCE1, STK11, SUFU, TERC, TERT, TMEM127, TP53, TSC1, TSC2, VHL, WRN and WT1.     04/28/2019 Cancer Staging   Staging  form: Breast, AJCC 8th Edition - Pathologic stage from 04/28/2019: Stage 0 (pTis (DCIS), pN0, cM0, ER+, PR+)   04/28/2019 Surgery   Right lumpectomy ([name] ([phone]: intermediate grade DCIS, clear margins. No regional lymph nodes were examined.   06/16/2019 - 07/11/2019 Radiation Therapy   [facility]   07/[facility] - 07/2024 Anti-estrogen oral therapy   Tamoxifen   03/22/2021 Relapse/Recurrence   Left Breast Biopsy: Grade 1-2 IDC (0.5 cm) Left Lumpectomy: 1.5 cm IG DCIS 0/1 LN Neg, ER 100%, PR 100%, Her 2 Neg, Ki 67: 15%, (no residual IDC)   06/13/2021 - 07/11/2021 Radiation Therapy   Adj XRT   07/2021 -  Anti-estrogen oral therapy   Letrozole     INTERVAL HISTORY:  Ms. KBraddyto review her survivorship care plan detailing her treatment course for breast cancer, as well as monitoring long-term side effects of that treatment, education regarding health maintenance, screening, and overall wellness and health promotion.     Overall, Ms. KLanyonreports feeling quite well. She is taking Letrozole and notes she has a level of fatigue she had not previously experienced.  She takes vitamin d and b12, levels have not been checked recently.  She is also experiencing a seroma and is following up with Dr. TMarlou Starksabuot managing this.  REVIEW OF SYSTEMS:  Review of Systems  Constitutional:  Positive for fatigue. Negative for appetite change, chills, fever and unexpected weight change.  HENT:   Negative for hearing loss, lump/mass and trouble swallowing.   Eyes:  Negative for eye problems and icterus.  Respiratory:  Negative for chest tightness, cough and shortness of breath.   Cardiovascular:  Negative for chest pain, leg swelling and palpitations.  Gastrointestinal:  Negative for abdominal distention, abdominal pain, constipation, diarrhea, nausea and vomiting.  Endocrine:  Positive for hot flashes (occur with blood sugar fluctuation).  Genitourinary:  Negative for difficulty urinating.    Musculoskeletal:  Negative for arthralgias.  Skin:  Negative for itching and rash.  Neurological:  Negative for dizziness, extremity weakness, headaches and numbness.  Hematological:  Negative for adenopathy. Does not bruise/bleed easily.  Psychiatric/Behavioral:  Negative for depression. The patient is not nervous/anxious.   Breast: Denies any new nodularity, masses, tenderness, nipple changes, or nipple discharge.    ONCOLOGY TREATMENT TEAM:  1. Surgeon:  Dr. [name] at [facility] 2. Medical Oncologist: Dr. Lindi Adie  3. Radiation Oncologist: Dr. Lynnette Caffey    PAST MEDICAL/SURGICAL HISTORY:  Past Medical History:  Diagnosis Date   Breast mass 12/27/2018   right- cancer, s/p lumpectomy, radiation   Coccidioidomycosis, pulmonary (HCC)    Complication of anesthesia    DDD (degenerative disc disease), cervical    Dementia ([place])    Early per Dr. Luan Pulling   Depression 2009   DM (diabetes mellitus screen) 2009   Family history of bone cancer    Family history of brain cancer    Family history of breast cancer    Family history of esophageal cancer    Family history of lung cancer    Glaucoma    HTN (hypertension)    Morbid obesity ([place])    Nodule of upper lobe of left lung 12/27/2018   Personal history of radiation therapy    PONV (postoperative nausea and vomiting)    Sleep apnea    had surgery   Type II diabetes mellitus, uncontrolled 01/31/2019   Past Surgical History:  Procedure Laterality Date   ABDOMINAL HYSTERECTOMY     TAH BSO   BREAST BIOPSY Right 02/07/2019   BREAST LUMPECTOMY Right 04/28/2019   BREAST LUMPECTOMY WITH RADIOACTIVE SEED AND SENTINEL LYMPH NODE BIOPSY Left 03/22/2021   Procedure: LEFT BREAST LUMPECTOMY WITH RADIOACTIVE SEED AND SENTINEL LYMPH NODE BIOPSY;  Surgeon: Jovita Kussmaul, MD;  Location: [place];  Service: General;  Laterality: Left;   BREAST LUMPECTOMY WITH RADIOACTIVE SEED LOCALIZATION Right 04/28/2019   Procedure:  RIGHT BREAST LUMPECTOMY WITH RADIOACTIVE SEED LOCALIZATION;  Surgeon: Jovita Kussmaul, MD;  Location: [place];  Service: General;  Laterality: Right;   CHOLECYSTECTOMY     LAPAROSCOPIC GASTRIC SLEEVE RESECTION     TUBAL LIGATION     UVULOPALATOPHARYNGOPLASTY       ALLERGIES:  Allergies  Allergen Reactions   Oxycodone-Acetaminophen Other (See Comments), Itching, Rash and Hives    Other reaction(s): Hallucinations   Propoxyphene Other (See Comments), Hives, Itching and Rash    Other reaction(s): Delusions (intolerance)   Demerol [Meperidine] Other (See Comments)    hallucinations   Morphine And Related Hives   Oxycodone-Aspirin Other (See Comments) and Hives   Chlorhexidine Rash     CURRENT MEDICATIONS:  Outpatient Encounter Medications as of 10/23/2021  Medication Sig Note   aspirin 81 MG tablet Take 1 tablet by mouth daily.    Calcium Carbonate-Vit D-Min (CALCIUM 1200 PO) Take 1,200 mg by mouth 2 (two) times daily.    Cholecalciferol (VITAMIN D-3) 25 MCG (1000 UT) CAPS Take 3 capsules by mouth daily.    insulin aspart (NOVOLOG) 100 UNIT/ML injection Inject 5-15 Units into the skin 3 (three) times daily with meals. 5-15 units on sliding scale     insulin glargine (LANTUS) 100 UNIT/ML injection Inject 60 Units into the skin at bedtime.     letrozole (FEMARA) 2.5 MG tablet Take  1 tablet (2.5 mg total) by mouth daily.    lisinopril-hydrochlorothiazide (ZESTORETIC) 10-12.5 MG tablet Take 1 tablet by mouth daily.    Multiple Vitamins-Minerals (MULTIVITAMIN ADULT PO) Take by mouth daily.    Pediatric Multivitamins-Iron Vicki Mallet W/IRON PO) Take 2 tablets by mouth daily.    Semaglutide,0.25 or 0.5MG/DOS, (OZEMPIC, 0.25 OR 0.5 MG/DOSE,) 2 MG/1.5ML SOPN Inject 0.5 mg into the skin once a week. 10/23/2021: Patient was increased to 1.18m/DOS    venlafaxine XR (EFFEXOR-XR) 75 MG 24 hr capsule Take 75 mg by mouth daily.    vitamin B-12 (CYANOCOBALAMIN) 500 MCG tablet Take  1,000 mcg by mouth 2 (two) times daily.    No facility-administered encounter medications on file as of 10/23/2021.     ONCOLOGIC FAMILY HISTORY:  Family History  Problem Relation Age of Onset   Heart disease Mother        "enlarged heart and leaking valves"   COPD Mother    Diabetes Mother    Breast cancer Mother [phone]  Cancer Mother        cancer involving the colon, esophagus, and liver   Peripheral vascular disease Father 527  Heart disease Father    Diabetes Maternal Grandfather    Polycystic kidney disease Sister    Polycystic kidney disease Brother    Hypertension Daughter    Breast cancer Maternal Grandmother 80   Breast cancer Other 758      maternal grandmother's sister   Brain cancer Other 617  Lung cancer Other      GENETIC COUNSELING/TESTING: Negative  SOCIAL HISTORY:  Social History   Socioeconomic History   Marital status: Widowed    Spouse name: Not on file   Number of children: 3   Years of education: Not on file   Highest education level: Not on file  Occupational History   Occupation: Retired-Daycare  Tobacco Use   Smoking status: Never   Smokeless tobacco: Never  Vaping Use   Vaping Use: Never used  Substance and Sexual Activity   Alcohol use: Yes    Comment: social   Drug use: No   Sexual activity: Not Currently    Birth control/protection: Surgical  Other Topics Concern   Not on file  Social History Narrative   Lives at home with children. Married for 48 years.      Retired.   Social Determinants of Health   Financial Resource Strain: Not on file  Food Insecurity: Not on file  Transportation Needs: Not on file  Physical Activity: Not on file  Stress: Not on file  Social Connections: Not on file  Intimate Partner Violence: Not on file     OBSERVATIONS/OBJECTIVE:   LABORATORY DATA:  None for this visit.  DIAGNOSTIC IMAGING:  None for this visit.      ASSESSMENT AND PLAN:  Ms.. KMastersis a pleasant 71y.o. female with  Stage 1A left breast invasive ductal carcinoma, ER+/PR+/HER2-, diagnosed in November 2022, treated with lumpectomy, adjuvant radiation therapy, and anti-estrogen therapy with letrozole beginning in May 2023.  She presents to the Survivorship Clinic for our initial meeting and routine follow-up post-completion of treatment for breast cancer.    1. Stage 1A left breast cancer:  Ms. KRevelleis continuing to recover from definitive treatment for breast cancer.  Considering her level of fatigue please see #2.  For future imaging please see #3.  Today, a comprehensive survivorship care plan and treatment summary was reviewed with the patient today detailing  her breast cancer diagnosis, treatment course, potential late/long-term effects of treatment, appropriate follow-up care with recommendations for the future, and patient education resources.  A copy of this summary, along with a letter will be sent to the patient's primary care provider via mail/fax/In Basket message after today's visit.    2.  Severe fatigue: She has a history of B12 deficiency, iron deficiency, and vitamin D deficiency.  These have not been checked recently and she does take some supplements.  We will check these today as this could be increasing her level of fatigue.  We also discussed the role letrozole may be playing and causing the fatigue.  I recommended that she take 2 weeks off of the letrozole and we will follow-up with her as a phone visit at that time to see if the fatigue has improved.  If so we can change her antiestrogen therapy to anastrozole and if not she can restart the letrozole.  In the meantime we will follow-up with her on the lab testing we added on to today's appointment.  We also discussed that if the fatigue was not being caused by the letrozole and does not improve we could change her Effexor to 175 mg tablet daily and 137.5 mg tablet daily since she previously did not tolerate the 150 mg she was on.  3.  Left breast  seroma: My nurse is working on getting the mammogram, ultrasound, and possible aspiration rescheduled.  Bilateral diagnostic mammogram is due in November 2023.  4. Bone health:  Given Ms. Maradiaga age/history of breast cancer and her current treatment regimen including anti-estrogen therapy with letrozole, she is at risk for bone demineralization.  Her last DEXA scan was September 02, 2021, which showed osteopenia with a T-score of -2.0.  She is taking calcium and vitamin D and we will recheck bone density in June 2025.  In the meantime, she was encouraged to increase her consumption of foods rich in calcium, as well as increase her weight-bearing activities.  She was given education on specific activities to promote bone health.  5. Cancer screening:  Due to Ms. Pucci history and her age, she should receive screening for skin cancers, colon cancer, and gynecologic cancers.  The information and recommendations are listed on the patient's comprehensive care plan/treatment summary and were reviewed in detail with the patient.    6. Health maintenance and wellness promotion: Ms. Horwitz was encouraged to consume 5-7 servings of fruits and vegetables per day. We reviewed the "Nutrition Rainbow" handout.  She was also encouraged to engage in moderate to vigorous exercise for 30 minutes per day most days of the week. We discussed the [employer] fitness program, which is designed for cancer survivors to help them become more physically fit after cancer treatments.  She was instructed to limit her alcohol consumption and continue to abstain from tobacco use.     7. Support services/counseling: It is not uncommon for this period of the patient's cancer care trajectory to be one of many emotions and stressors.  She was given information regarding our available services and encouraged to contact me with any questions or for help enrolling in any of our support group/programs.    Follow up instructions:    -Return  to cancer center in 02/2022 for f/u with Dr. Lindi Adie -Phone visit in 2 weeks  -Mammogram due in 01/2022 -Bone Density 08/2023 -Follow up with surgery per Dr. [name] -She is welcome to return back to the Survivorship Clinic at any time;  no additional follow-up needed at this time.  -Consider referral back to survivorship as a long-term survivor for continued surveillance  The patient was provided an opportunity to ask questions and all were answered. The patient agreed with the plan and demonstrated an understanding of the instructions.   The patient was advised to call back or seek an in-person evaluation if the symptoms worsen or if the condition fails to improve as anticipated.   Total encounter time:50 minutes*in face-to-face visit time, chart review, lab review, care coordination, order entry, and documentation of the encounter time.    Wilber Bihari, NP 10/23/21 11:04 AM Medical Oncology and Hematology [facility] [place], [place] [address] Tel. [phone]    Fax. [phone]  *Total Encounter Time as defined by the Centers for Medicare and Medicaid Services includes, in addition to the face-to-face time of a patient visit (documented in the note above) non-face-to-face time: obtaining and reviewing outside history, ordering and reviewing medications, tests or procedures, care coordination (communications with other health care professionals or caregivers) and documentation in the medical record.

## 2021-11-03 NOTE — Progress Notes (Signed)
HEMATOLOGY-ONCOLOGY TELEPHONE VISIT PROGRESS NOTE  I connected with our patient on 11/06/21 at  8:15 AM EDT by telephone and verified that I am speaking with the correct person using two identifiers.  I discussed the limitations, risks, security and privacy concerns of performing an evaluation and management service by telephone and the availability of in person appointments.  I also discussed with the patient that there may be a patient responsible charge related to this service. The patient expressed understanding and agreed to proceed.   History of Present Illness: Miranda Fuller is a 71 y.o. with above-mentioned history of right breast DCIS having undergone lumpectomy and radiation,.  She did not take adjuvant antiestrogen therapy because of concern for side effects. She presents to the clinic today for a telephone follow-up to discuss after have been off letrozole.  She could not tolerate letrozole and discontinued it in August.  Since she stopped it she is starting to feel better.  She wants to consider taking anastrozole.  Oncology History  Ductal carcinoma in situ (DCIS) of right breast  12/27/2018 Initial Diagnosis   Patient noted bilateral dark red spontaneous nipple discharge and a left breast lump palpable on exam. Mammogram showed calcifications in the right breast spanning 3.75m and no correlate for the previously palpable left breast lump. Breast MRI showed no evidence of malignancy bilaterally. Right breast biopsy showed intermediate grade DCIS, ER+ 100%, PR+ 70%.    03/01/2019 Genetic Testing   Negative genetic testing:  No pathogenic variants detected on the [facility]ae Breast Cancer STAT panel or the [facility]ae Multi-Cancer panel. A variant of uncertain significance was detected in the APC gene called c.449A>G. The report date is 03/01/2019.  The Breast Cancer STAT panel offered by [facility] includes sequencing and rearrangement analysis for the following 9 genes:  ATM, BRCA1, BRCA2, CDH1,  CHEK2, PALB2, PTEN, STK11 and TP53.  The Multi-Cancer Panel offered by [facility] includes sequencing and/or deletion duplication testing of the following 85 genes: AIP, ALK, APC, ATM, AXIN2,BAP1,  BARD1, BLM, BMPR1A, BRCA1, BRCA2, BRIP1, CASR, CDC73, CDH1, CDK4, CDKN1B, CDKN1C, CDKN2A (p14ARF), CDKN2A (p16INK4a), CEBPA, CHEK2, CTNNA1, DICER1, DIS3L2, EGFR (c.2369C>T, p.Thr790Met variant only), EPCAM (Deletion/duplication testing only), FH, FLCN, GATA2, GPC3, GREM1 (Promoter region deletion/duplication testing only), HOXB13 (c.251G>A, p.Gly84Glu), HRAS, KIT, MAX, MEN1, MET, MITF (c.952G>A, p.Glu318Lys variant only), MLH1, MSH2, MSH3, MSH6, MUTYH, NBN, NF1, NF2, NTHL1, PALB2, PDGFRA, PHOX2B, PMS2, POLD1, POLE, POT1, PRKAR1A, PTCH1, PTEN, RAD50, RAD51C, RAD51D, RB1, RECQL4, RET, RNF43, RUNX1, SDHAF2, SDHA (sequence changes only), SDHB, SDHC, SDHD, SMAD4, SMARCA4, SMARCB1, SMARCE1, STK11, SUFU, TERC, TERT, TMEM127, TP53, TSC1, TSC2, VHL, WRN and WT1.     04/28/2019 Cancer Staging   Staging form: Breast, AJCC 8th Edition - Pathologic stage from 04/28/2019: Stage 0 (pTis (DCIS), pN0, cM0, ER+, PR+)   04/28/2019 Surgery   Right lumpectomy ([name] ([phone]: intermediate grade DCIS, clear margins. No regional lymph nodes were examined.   06/16/2019 - 07/11/2019 [facility]   [facility]   07/2019 - 07/2024 Anti-estrogen oral therapy   Tamoxifen   Malignant neoplasm involving both nipple and areola of left breast in female, estrogen receptor negative ([place]  01/31/2021 Initial Diagnosis   Left Breast Biopsy: Grade 1-2 IDC (0.5 cm), ER positive, PR positive, Ki-67 15%, HER2 IHC 2+, FISH negative    01/31/2021 Cancer Staging   Staging form: Breast, AJCC 8th Edition - Clinical stage from 01/31/2021: Stage IA (cT1a, cN0, cM0, G2, ER+, PR+, HER2-) - Signed by CGardenia Phlegm NP on 10/23/2021  Histologic grading system: 3 grade system   03/22/2021 Surgery   Left Lumpectomy: 1.5 cm  DCIS, no  invasive cancer, 0/1 LN Neg, ER 100%, PR 100%, Her 2 Neg, Ki 67: 15%, (no residual IDC)   06/13/2021 - 07/11/2021 Radiation Therapy   Adjuvant radiation in [facility]   07/2021 -  Anti-estrogen oral therapy   Letrozole      REVIEW OF SYSTEMS:   Constitutional: Denies fevers, chills or abnormal weight loss All other systems were reviewed with the patient and are negative. Observations/Objective:     Assessment Plan:  Malignant neoplasm involving both nipple and areola of left breast in female, estrogen receptor negative ([place]) 01/31/2021: Left Breast Biopsy: Grade 1-2 IDC (0.5 cm) 03/22/2021: Left Lumpectomy: 1.5 cm IG DCIS 0/1 LN Neg, ER 100%, PR 100%, Her 2 Neg, Ki 67: 15%, (no residual IDC)   Recommendations: 1. Adjuvant radiation therapy in [place] completed 07/11/2021 2. Adjuvant antiestrogen therapy: Letrozole started 08/03/2021 stopped 10/23/21, switching to anastrozole 11/07/2018 3/2 a tablet daily.   Severe fatigue: Lab work 10/23/2021: Hemoglobin 14.3, CBC normal, TSH 1.19, B12 362, blood sugar 186, creatinine 0.72, LFTs normal, vitamin D 54, iron studies showed iron saturation 38%, ferritin 291     Ductal carcinoma in situ (DCIS) of right breast 12/27/2018:Patient noted bilateral dark red spontaneous nipple discharge and a left breast lump palpable on exam. Mammogram showed calcifications in the right breast spanning 3.25m and no correlate for the previously palpable left breast lump. Breast MRI showed no evidence of malignancy bilaterally. Right breast biopsy showed intermediate grade DCIS, ER+ 100%, PR+ 70%.    04/28/2019: Right lumpectomy ([name]: intermediate grade DCIS, clear margins ER 100%, PR 70% [facility]  Return to clinic in 1 month to assess tolerance to anastrozole therapy.  I discussed the assessment and treatment plan with the patient. The patient was provided an opportunity to ask questions and all were answered. The  patient agreed with the plan and demonstrated an understanding of the instructions. The patient was advised to call back or seek an in-person evaluation if the symptoms worsen or if the condition fails to improve as anticipated.   I provided 12 minutes of non-face-to-face time during this encounter.  This includes time for charting and coordination of care   VHarriette Ohara MD

## 2021-11-06 ENCOUNTER — Other Ambulatory Visit (HOSPITAL_COMMUNITY): Payer: Self-pay

## 2021-11-06 ENCOUNTER — Inpatient Hospital Stay (HOSPITAL_BASED_OUTPATIENT_CLINIC_OR_DEPARTMENT_OTHER): Payer: Medicare Other | Admitting: Hematology and Oncology

## 2021-11-06 DIAGNOSIS — D0511 Intraductal carcinoma in situ of right breast: Secondary | ICD-10-CM

## 2021-11-06 DIAGNOSIS — C50012 Malignant neoplasm of nipple and areola, left female breast: Secondary | ICD-10-CM

## 2021-11-06 DIAGNOSIS — Z171 Estrogen receptor negative status [ER-]: Secondary | ICD-10-CM | POA: Diagnosis not present

## 2021-11-06 MED ORDER — ANASTROZOLE 1 MG PO TABS
0.5000 mg | ORAL_TABLET | Freq: Every day | ORAL | 0 refills | Status: DC
  Filled 2021-11-06: qty 30, 60d supply, fill #0

## 2021-11-06 NOTE — Assessment & Plan Note (Signed)
12/27/2018:Patient noted bilateral dark red spontaneous nipple discharge and a left breast lump palpable on exam. Mammogram showed calcifications in the right breast spanning 3.80m and no correlate for the previously palpable left breast lump. Breast MRI showed no evidence of malignancy bilaterally. Right breast biopsy showed intermediate grade DCIS, ER+ 100%, PR+ 70%.  04/28/2019:Right lumpectomy ([name]: intermediate grade DCIS, clear marginsER 100%, PR 70% [facility]

## 2021-11-06 NOTE — Assessment & Plan Note (Signed)
01/31/2021: Left Breast Biopsy: Grade 1-2 IDC (0.5 cm) 03/22/2021: Left Lumpectomy: 1.5 cm IG DCIS 0/1 LN Neg, ER 100%, PR 100%, Her 2 Neg, Ki 67: 15%, (no residual IDC)  Recommendations: 1. Adjuvant radiation therapy in [place] completed 07/11/2021 2. Adjuvant antiestrogen therapy: Letrozole started 08/03/2021  Letrozole toxicities:  Severe fatigue: Lab work 10/23/2021: Hemoglobin 14.3, CBC normal, TSH 1.19, B12 362, blood sugar 186, creatinine 0.72, LFTs normal, vitamin D 54, iron studies showed iron saturation 38%, ferritin 291  I discussed with the patient that there are no issues with the lab work.  Most likely cause of her fatigue is probably diabetes

## 2021-11-21 ENCOUNTER — Other Ambulatory Visit: Payer: Self-pay | Admitting: Adult Health

## 2021-11-21 ENCOUNTER — Ambulatory Visit
Admission: RE | Admit: 2021-11-21 | Discharge: 2021-11-21 | Disposition: A | Discharge status: Home / Self Care | Payer: Medicare Other | Source: Ambulatory Visit | Attending: Adult Health | Admitting: Adult Health

## 2021-11-21 DIAGNOSIS — E538 Deficiency of other specified B group vitamins: Secondary | ICD-10-CM

## 2021-11-21 DIAGNOSIS — Z171 Estrogen receptor negative status [ER-]: Secondary | ICD-10-CM

## 2021-11-21 DIAGNOSIS — E559 Vitamin D deficiency, unspecified: Secondary | ICD-10-CM

## 2021-11-21 DIAGNOSIS — R5383 Other fatigue: Secondary | ICD-10-CM

## 2021-11-21 DIAGNOSIS — N644 Mastodynia: Secondary | ICD-10-CM | POA: Diagnosis not present

## 2021-12-12 DIAGNOSIS — Z170 Estrogen receptor positive status [ER+]: Secondary | ICD-10-CM | POA: Diagnosis not present

## 2021-12-12 DIAGNOSIS — C50212 Malignant neoplasm of upper-inner quadrant of left female breast: Secondary | ICD-10-CM | POA: Diagnosis not present

## 2021-12-14 DIAGNOSIS — E1165 Type 2 diabetes mellitus with hyperglycemia: Secondary | ICD-10-CM | POA: Diagnosis not present

## 2021-12-14 DIAGNOSIS — I10 Essential (primary) hypertension: Secondary | ICD-10-CM | POA: Diagnosis not present

## 2021-12-17 DIAGNOSIS — E1165 Type 2 diabetes mellitus with hyperglycemia: Secondary | ICD-10-CM | POA: Diagnosis not present

## 2021-12-17 DIAGNOSIS — E782 Mixed hyperlipidemia: Secondary | ICD-10-CM | POA: Diagnosis not present

## 2021-12-17 DIAGNOSIS — I10 Essential (primary) hypertension: Secondary | ICD-10-CM | POA: Diagnosis not present

## 2021-12-17 DIAGNOSIS — R5383 Other fatigue: Secondary | ICD-10-CM | POA: Diagnosis not present

## 2022-01-23 ENCOUNTER — Other Ambulatory Visit: Payer: Self-pay | Admitting: Hematology and Oncology

## 2022-01-23 ENCOUNTER — Other Ambulatory Visit (HOSPITAL_COMMUNITY): Payer: Self-pay

## 2022-01-23 MED ORDER — ANASTROZOLE 1 MG PO TABS
0.5000 mg | ORAL_TABLET | Freq: Every day | ORAL | 3 refills | Status: DC
  Filled 2022-01-23: qty 30, 60d supply, fill #0
  Filled 2022-03-31: qty 30, 60d supply, fill #1

## 2022-01-24 ENCOUNTER — Other Ambulatory Visit (HOSPITAL_COMMUNITY): Payer: Self-pay

## 2022-02-13 DIAGNOSIS — E1165 Type 2 diabetes mellitus with hyperglycemia: Secondary | ICD-10-CM | POA: Diagnosis not present

## 2022-02-13 DIAGNOSIS — I10 Essential (primary) hypertension: Secondary | ICD-10-CM | POA: Diagnosis not present

## 2022-02-17 DIAGNOSIS — C50919 Malignant neoplasm of unspecified site of unspecified female breast: Secondary | ICD-10-CM | POA: Diagnosis not present

## 2022-02-17 DIAGNOSIS — M1810 Unilateral primary osteoarthritis of first carpometacarpal joint, unspecified hand: Secondary | ICD-10-CM | POA: Diagnosis not present

## 2022-02-17 DIAGNOSIS — E1165 Type 2 diabetes mellitus with hyperglycemia: Secondary | ICD-10-CM | POA: Diagnosis not present

## 2022-02-17 DIAGNOSIS — E7849 Other hyperlipidemia: Secondary | ICD-10-CM | POA: Diagnosis not present

## 2022-02-17 DIAGNOSIS — Z23 Encounter for immunization: Secondary | ICD-10-CM | POA: Diagnosis not present

## 2022-02-17 DIAGNOSIS — H6590 Unspecified nonsuppurative otitis media, unspecified ear: Secondary | ICD-10-CM | POA: Diagnosis not present

## 2022-02-17 DIAGNOSIS — I10 Essential (primary) hypertension: Secondary | ICD-10-CM | POA: Diagnosis not present

## 2022-02-25 ENCOUNTER — Inpatient Hospital Stay: Payer: Medicare Other | Admitting: Hematology and Oncology

## 2022-02-25 ENCOUNTER — Telehealth: Payer: Self-pay | Admitting: *Deleted

## 2022-02-25 NOTE — Assessment & Plan Note (Deleted)
01/31/2021 left Breast Biopsy: Grade 1-2 IDC (0.5 cm) 03/22/2021: Left Lumpectomy: 1.5 cm IG DCIS 0/1 LN Neg, ER 100%, PR 100%, Her 2 Neg, Ki 67: 15%, (no residual IDC)   Recommendations: 1. Adjuvant radiation therapy at [facility] completed 07/11/2021 2. Adjuvant antiestrogen therapy: recommend Letrozole started May 2023  Letrozole toxicities:  Breast cancer surveillance: Breast exam 02/25/2022: Benign Mammogram and ultrasound 11/21/2021: No evidence of malignancy density category B  Return to clinic in 1 year for follow-up

## 2022-02-25 NOTE — Assessment & Plan Note (Deleted)
01/31/2021 left Breast Biopsy: Grade 1-2 IDC (0.5 cm) 03/22/2021: Left Lumpectomy: 1.5 cm IG DCIS 0/1 LN Neg, ER 100%, PR 100%, Her 2 Neg, Ki 67: 15%, (no residual IDC)   Recommendations: 1. Adjuvant radiation therapy at [facility] completed 07/11/2021 2. Adjuvant antiestrogen therapy: recommend Letrozole started May 2023  Letrozole toxicities:  Breast cancer surveillance: Breast exam 02/25/2022: Benign Mammogram and ultrasound 11/21/2021: No evidence of malignancy density category B  Return to clinic in 1 year for follow-up

## 2022-02-25 NOTE — Progress Notes (Incomplete)
Patient Care Team: Sasser, Silvestre Moment, MD as PCP - General (Family Medicine) Burnell Blanks, MD as PCP - Cardiology (Cardiology) Nicholas Lose, MD as Consulting Physician (Hematology and Oncology) Jovita Kussmaul, MD as Consulting Physician (General Surgery) Renaldo Harrison, MD as Referring Physician (Radiation Oncology)  DIAGNOSIS:  Encounter Diagnoses  Name Primary?   Ductal carcinoma in situ (DCIS) of right breast Yes   Malignant neoplasm involving both nipple and areola of left breast in female, estrogen receptor negative ([place])     SUMMARY OF ONCOLOGIC HISTORY: Oncology History  Ductal carcinoma in situ (DCIS) of right breast  12/27/2018 Initial Diagnosis   Patient noted bilateral dark red spontaneous nipple discharge and a left breast lump palpable on exam. Mammogram showed calcifications in the right breast spanning 3.13m and no correlate for the previously palpable left breast lump. Breast MRI showed no evidence of malignancy bilaterally. Right breast biopsy showed intermediate grade DCIS, ER+ 100%, PR+ 70%.    03/01/2019 Genetic Testing   Negative genetic testing:  No pathogenic variants detected on the [facility] Breast Cancer STAT panel or the [facility]ae Multi-Cancer panel. A variant of uncertain significance was detected in the APC gene called c.449A>G. The report date is 03/01/2019.  The Breast Cancer STAT panel offered by [facility] includes sequencing and rearrangement analysis for the following 9 genes:  ATM, BRCA1, BRCA2, CDH1, CHEK2, PALB2, PTEN, STK11 and TP53.  The Multi-Cancer Panel offered by [facility] includes sequencing and/or deletion duplication testing of the following 85 genes: AIP, ALK, APC, ATM, AXIN2,BAP1,  BARD1, BLM, BMPR1A, BRCA1, BRCA2, BRIP1, CASR, CDC73, CDH1, CDK4, CDKN1B, CDKN1C, CDKN2A (p14ARF), CDKN2A (p16INK4a), CEBPA, CHEK2, CTNNA1, DICER1, DIS3L2, EGFR (c.2369C>T, p.Thr790Met variant only), EPCAM (Deletion/duplication testing only), FH, FLCN, GATA2,  GPC3, GREM1 (Promoter region deletion/duplication testing only), HOXB13 (c.251G>A, p.Gly84Glu), HRAS, KIT, MAX, MEN1, MET, MITF (c.952G>A, p.Glu318Lys variant only), MLH1, MSH2, MSH3, MSH6, MUTYH, NBN, NF1, NF2, NTHL1, PALB2, PDGFRA, PHOX2B, PMS2, POLD1, POLE, POT1, PRKAR1A, PTCH1, PTEN, RAD50, RAD51C, RAD51D, RB1, RECQL4, RET, RNF43, RUNX1, SDHAF2, SDHA (sequence changes only), SDHB, SDHC, SDHD, SMAD4, SMARCA4, SMARCB1, SMARCE1, STK11, SUFU, TERC, TERT, TMEM127, TP53, TSC1, TSC2, VHL, WRN and WT1.     04/28/2019 Cancer Staging   Staging form: Breast, AJCC 8th Edition - Pathologic stage from 04/28/2019: Stage 0 (pTis (DCIS), pN0, cM0, ER+, PR+)   04/28/2019 Surgery   Right lumpectomy ([name] [phone]: intermediate grade DCIS, clear margins. No regional lymph nodes were examined.   06/16/2019 - 07/11/2019 [facility]   [facility]   [facility] - 07/2024 Anti-estrogen oral therapy   Tamoxifen   Malignant neoplasm involving both nipple and areola of left breast in female, estrogen receptor negative ([place]  01/31/2021 Initial Diagnosis   Left Breast Biopsy: Grade 1-2 IDC (0.5 cm), ER positive, PR positive, Ki-67 15%, HER2 IHC 2+, FISH negative    01/31/2021 Cancer Staging   Staging form: Breast, AJCC 8th Edition - Clinical stage from 01/31/2021: Stage IA (cT1a, cN0, cM0, G2, ER+, PR+, HER2-) - Signed by CGardenia Phlegm NP on 10/23/2021 Histologic grading system: 3 grade system   03/22/2021 Surgery   Left Lumpectomy: 1.5 cm  DCIS, no invasive cancer, 0/1 LN Neg, ER 100%, PR 100%, Her 2 Neg, Ki 67: 15%, (no residual IDC)   06/13/2021 - 07/11/2021 [facility]iation [facility]   [facility]  [facility] -  Anti-estrogen oral therapy   Letrozole      CHIEF COMPLIANT: Anastrozole follow up   INTERVAL HISTORY: Miranda Burkitt  Fuller is a 71 y.o. with above-mentioned history of right breast DCIS having undergone lumpectomy and radiation,.  She did not take adjuvant antiestrogen therapy  because of concern for side effects. She presents to the clinic today for a follow-up.   ALLERGIES:  is allergic to oxycodone-acetaminophen, propoxyphene, demerol [meperidine], morphine and related, oxycodone-aspirin, and chlorhexidine.  MEDICATIONS:  Current Outpatient Medications  Medication Sig Dispense Refill   anastrozole (ARIMIDEX) 1 MG tablet Take 1/2 tablet (0.5 mg total) by mouth daily. 30 tablet 3   aspirin 81 MG tablet Take 1 tablet by mouth daily.     Calcium Carbonate-Vit D-Min (CALCIUM 1200 PO) Take 1,200 mg by mouth 2 (two) times daily.     Cholecalciferol (VITAMIN D-3) 25 MCG (1000 UT) CAPS Take 3 capsules by mouth daily.     insulin aspart (NOVOLOG) 100 UNIT/ML injection Inject 5-15 Units into the skin 3 (three) times daily with meals. 5-15 units on sliding scale      insulin glargine (LANTUS) 100 UNIT/ML injection Inject 60 Units into the skin at bedtime.      ketoconazole (NIZORAL) 2 % cream Apply 1 Application topically daily. 15 g 0   lisinopril-hydrochlorothiazide (ZESTORETIC) 10-12.5 MG tablet Take 1 tablet by mouth daily.     Multiple Vitamins-Minerals (MULTIVITAMIN ADULT PO) Take by mouth daily.     Pediatric Multivitamins-Iron Vicki Mallet W/IRON PO) Take 2 tablets by mouth daily.     Semaglutide,0.25 or 0.5MG/DOS, (OZEMPIC, 0.25 OR 0.5 MG/DOSE,) 2 MG/1.5ML SOPN Inject 0.5 mg into the skin once a week.     venlafaxine XR (EFFEXOR-XR) 75 MG 24 hr capsule Take 75 mg by mouth daily.     vitamin B-12 (CYANOCOBALAMIN) 500 MCG tablet Take 1,000 mcg by mouth 2 (two) times daily.     No current facility-administered medications for this visit.    PHYSICAL EXAMINATION: ECOG PERFORMANCE STATUS: {CHL ONC ECOG PS:[phone]}  There were no vitals filed for this visit. There were no vitals filed for this visit.  BREAST:*** No palpable masses or nodules in either right or left breasts. No palpable axillary supraclavicular or infraclavicular adenopathy no breast tenderness  or nipple discharge. (exam performed in the presence of a chaperone)  LABORATORY DATA:  I have reviewed the data as listed    Latest Ref Rng & Units 10/23/2021   10:59 AM 03/15/2021    9:32 AM 04/25/2019    9:30 AM  CMP  Glucose 70 - 99 mg/dL 186  192  148   BUN 8 - 23 mg/dL _0 Creatinine 0.44 - 1.00 mg/dL 0.72  0.74  0.76   Sodium 135 - 145 mmol/L 138  137  137   Potassium 3.5 - 5.1 mmol/L 4.0  4.3  4.3   Chloride 98 - 111 mmol/L 101  101  105   CO2 22 - 32 mmol/L _1 Calcium 8.9 - 10.3 mg/dL 9.6  9.0  9.0   Total Protein 6.5 - 8.1 g/dL 7.6     Total Bilirubin 0.3 - 1.2 mg/dL 0.5     Alkaline Phos 38 - 126 U/L 76     AST 15 - 41 U/L 13     ALT 0 - 44 U/L 10       Lab Results  Component Value Date   WBC 7.7 10/23/2021   HGB 14.3 10/23/2021   HCT 41.4 10/23/2021   MCV 92.2 10/23/2021   PLT 286 10/23/2021  NEUTROABS 5.5 10/23/2021    ASSESSMENT & PLAN:  No problem-specific Assessment & Plan notes found for this encounter.    No orders of the defined types were placed in this encounter.  The patient has a good understanding of the overall plan. she agrees with it. she will call with any problems that may develop before the next visit here. Total time spent: 30 mins including face to face time and time spent for planning, charting and co-ordination of care   Miranda Fuller, [place] 02/25/22    I Gardiner Coins am scribing for Dr. Lindi Fuller  ***

## 2022-02-25 NOTE — Assessment & Plan Note (Deleted)
04/28/2019: Right lumpectomy [name]): intermediate grade DCIS, clear margins ER 100%, PR 70% Adjuvant radiation at [place]: Adjuvant antiestrogen therapy with tamoxifen 20 mg daily x5 years was recommended but she did not take it.

## 2022-02-25 NOTE — Telephone Encounter (Signed)
Received call from pt stating she tested positive for Covid 02/24/22 and needs to reschedule appt for today.  Appt rescheduled, pt educated to contact PCP and alert their office of positive Covid test for further evaluation and tx.  Pt verbalized understanding.

## 2022-03-12 ENCOUNTER — Telehealth: Payer: Self-pay | Admitting: Hematology and Oncology

## 2022-03-12 NOTE — Telephone Encounter (Signed)
Rescheduled appointment per provider on call. Left voicemail.  

## 2022-03-24 ENCOUNTER — Ambulatory Visit: Payer: Medicare Other | Admitting: Hematology and Oncology

## 2022-03-31 ENCOUNTER — Other Ambulatory Visit (HOSPITAL_COMMUNITY): Payer: Self-pay

## 2022-04-10 ENCOUNTER — Inpatient Hospital Stay: Payer: Medicare Other | Attending: Hematology and Oncology | Admitting: Hematology and Oncology

## 2022-04-10 ENCOUNTER — Other Ambulatory Visit: Payer: Self-pay

## 2022-04-10 VITALS — BP 163/82 | HR 95 | Temp 97.5°F | Resp 17 | Wt 203.7 lb

## 2022-04-10 DIAGNOSIS — C50012 Malignant neoplasm of nipple and areola, left female breast: Secondary | ICD-10-CM | POA: Diagnosis not present

## 2022-04-10 DIAGNOSIS — R5383 Other fatigue: Secondary | ICD-10-CM | POA: Diagnosis not present

## 2022-04-10 DIAGNOSIS — Z79811 Long term (current) use of aromatase inhibitors: Secondary | ICD-10-CM | POA: Diagnosis not present

## 2022-04-10 DIAGNOSIS — Z171 Estrogen receptor negative status [ER-]: Secondary | ICD-10-CM | POA: Diagnosis not present

## 2022-04-10 DIAGNOSIS — Z923 Personal history of irradiation: Secondary | ICD-10-CM | POA: Insufficient documentation

## 2022-04-10 MED ORDER — ANASTROZOLE 1 MG PO TABS: 0.5000 mg | ORAL_TABLET | Freq: Every day | ORAL | 6 refills | Status: DC

## 2022-04-10 NOTE — Progress Notes (Signed)
Patient Care Team: Sasser, Silvestre Moment, MD as PCP - General (Family Medicine) Burnell Blanks, MD as PCP - Cardiology (Cardiology) Nicholas Lose, MD as Consulting Physician (Hematology and Oncology) Jovita Kussmaul, MD as Consulting Physician (General Surgery) Renaldo Harrison, MD as Referring Physician (Radiation Oncology)  DIAGNOSIS:  Encounter Diagnosis  Name Primary?   Malignant neoplasm involving both nipple and areola of left breast in female, estrogen receptor negative ([place]) Yes    SUMMARY OF ONCOLOGIC HISTORY: Oncology History  Ductal carcinoma in situ (DCIS) of right breast  12/27/2018 Initial Diagnosis   Patient noted bilateral dark red spontaneous nipple discharge and a left breast lump palpable on exam. Mammogram showed calcifications in the right breast spanning 3.40m and no correlate for the previously palpable left breast lump. Breast MRI showed no evidence of malignancy bilaterally. Right breast biopsy showed intermediate grade DCIS, ER+ 100%, PR+ 70%.    03/01/2019 Genetic Testing   Negative genetic testing:  No pathogenic variants detected on the [facility]ae Breast Cancer STAT panel or the [facility]ae Multi-Cancer panel. A variant of uncertain significance was detected in the APC gene called c.449A>G. The report date is 03/01/2019.  The Breast Cancer STAT panel offered by [facility] includes sequencing and rearrangement analysis for the following 9 genes:  ATM, BRCA1, BRCA2, CDH1, CHEK2, PALB2, PTEN, STK11 and TP53.  The Multi-Cancer Panel offered by [facility] includes sequencing and/or deletion duplication testing of the following 85 genes: AIP, ALK, APC, ATM, AXIN2,BAP1,  BARD1, BLM, BMPR1A, BRCA1, BRCA2, BRIP1, CASR, CDC73, CDH1, CDK4, CDKN1B, CDKN1C, CDKN2A (p14ARF), CDKN2A (p16INK4a), CEBPA, CHEK2, CTNNA1, DICER1, DIS3L2, EGFR (c.2369C>T, p.Thr790Met variant only), EPCAM (Deletion/duplication testing only), FH, FLCN, GATA2, GPC3, GREM1 (Promoter region deletion/duplication  testing only), HOXB13 (c.251G>A, p.Gly84Glu), HRAS, KIT, MAX, MEN1, MET, MITF (c.952G>A, p.Glu318Lys variant only), MLH1, MSH2, MSH3, MSH6, MUTYH, NBN, NF1, NF2, NTHL1, PALB2, PDGFRA, PHOX2B, PMS2, POLD1, POLE, POT1, PRKAR1A, PTCH1, PTEN, RAD50, RAD51C, RAD51D, RB1, RECQL4, RET, RNF43, RUNX1, SDHAF2, SDHA (sequence changes only), SDHB, SDHC, SDHD, SMAD4, SMARCA4, SMARCB1, SMARCE1, STK11, SUFU, TERC, TERT, TMEM127, TP53, TSC1, TSC2, VHL, WRN and WT1.     04/28/2019 Cancer Staging   Staging form: Breast, AJCC 8th Edition - Pathologic stage from 04/28/2019: Stage 0 (pTis (DCIS), pN0, cM0, ER+, PR+)   04/28/2019 Surgery   Right lumpectomy ([name] [phone]: intermediate grade DCIS, clear margins. No regional lymph nodes were examined.   06/16/2019 - 07/11/2019 [facility]   [facility]   [facility] 07/2024 Anti-estrogen oral therapy   Tamoxifen   Malignant neoplasm involving both nipple and areola of left breast in female, estrogen receptor negative ([place]  01/31/2021 Initial Diagnosis   Left Breast Biopsy: Grade 1-2 IDC (0.5 cm), ER positive, PR positive, Ki-67 15%, HER2 IHC 2+, FISH negative    01/31/2021 Cancer Staging   Staging form: Breast, AJCC 8th Edition - Clinical stage from 01/31/2021: Stage IA (cT1a, cN0, cM0, G2, ER+, PR+, HER2-) - Signed by CGardenia Phlegm NP on 10/23/2021 Histologic grading system: 3 grade system   03/22/2021 Surgery   Left Lumpectomy: 1.5 cm  DCIS, no invasive cancer, 0/1 LN Neg, ER 100%, PR 100%, Her 2 Neg, Ki 67: 15%, (no residual IDC)   06/13/2021 - 07/11/2021 [facility]iation Therapy   [facility]  [facility] -  Anti-estrogen oral therapy   Letrozole      CHIEF COMPLIANT: Right breast DCIS  INTERVAL HISTORY: LMINETTE MANDERSis a 72y.o. with above-mentioned history of right breast DCIS  having undergone lumpectomy and radiation. Currently on anastrozole. She presents to the clinic today for a follow-up. She reports that she can  tolerate the anastrozole she takes it at night and she doesn't have the night sweats. She complains of been fatigue all the time.    ALLERGIES:  is allergic to oxycodone-acetaminophen, propoxyphene, demerol [meperidine], morphine and related, oxycodone-aspirin, and chlorhexidine.  MEDICATIONS:  Current Outpatient Medications  Medication Sig Dispense Refill   ezetimibe (ZETIA) 10 MG tablet Take 10 mg by mouth daily.     anastrozole (ARIMIDEX) 1 MG tablet Take 1/2 tablet (0.5 mg total) by mouth daily. 30 tablet 6   aspirin 81 MG tablet Take 1 tablet by mouth daily.     Calcium Carbonate-Vit D-Min (CALCIUM 1200 PO) Take 1,200 mg by mouth 2 (two) times daily.     Cholecalciferol (VITAMIN D-3) 25 MCG (1000 UT) CAPS Take 3 capsules by mouth daily.     insulin aspart (NOVOLOG) 100 UNIT/ML injection Inject 5-15 Units into the skin 3 (three) times daily with meals. 5-15 units on sliding scale      insulin glargine (LANTUS) 100 UNIT/ML injection Inject 60 Units into the skin at bedtime.      lisinopril-hydrochlorothiazide (ZESTORETIC) 10-12.5 MG tablet Take 1 tablet by mouth daily.     Multiple Vitamins-Minerals (MULTIVITAMIN ADULT PO) Take by mouth daily.     Pediatric Multivitamins-Iron Vicki Mallet W/IRON PO) Take 2 tablets by mouth daily.     Semaglutide,0.25 or 0.'5MG'$ /DOS, (OZEMPIC, 0.25 OR 0.5 MG/DOSE,) 2 MG/1.5ML SOPN Inject 0.5 mg into the skin once a week.     venlafaxine XR (EFFEXOR-XR) 75 MG 24 hr capsule Take 75 mg by mouth daily.     vitamin B-12 (CYANOCOBALAMIN) 500 MCG tablet Take 1,000 mcg by mouth 2 (two) times daily.     No current facility-administered medications for this visit.    PHYSICAL EXAMINATION: ECOG PERFORMANCE STATUS: 1 - Symptomatic but completely ambulatory  Vitals:   04/10/22 1407  BP: (!) 163/82  Pulse: 95  Resp: 17  Temp: (!) 97.5 F (36.4 C)  SpO2: 97%   Filed Weights   04/10/22 1407  Weight: 203 lb 11.2 oz (92.4 kg)    BREAST: No palpable masses or  nodules in either right or left breasts. No palpable axillary supraclavicular or infraclavicular adenopathy no breast tenderness or nipple discharge. Scar tissue (exam performed in the presence of a chaperone)  LABORATORY DATA:  I have reviewed the data as listed    Latest Ref Rng & Units 10/23/2021   10:59 AM 03/15/2021    9:32 AM 04/25/2019    9:30 AM  CMP  Glucose 70 - 99 mg/dL 186  192  148   BUN 8 - 23 mg/dL '26  16  22   '$ Creatinine 0.44 - 1.00 mg/dL 0.72  0.74  0.76   Sodium 135 - 145 mmol/L 138  137  137   Potassium 3.5 - 5.1 mmol/L 4.0  4.3  4.3   Chloride 98 - 111 mmol/L 101  101  105   CO2 22 - 32 mmol/L '30  27  23   '$ Calcium 8.9 - 10.3 mg/dL 9.6  9.0  9.0   Total Protein 6.5 - 8.1 g/dL 7.6     Total Bilirubin 0.3 - 1.2 mg/dL 0.5     Alkaline Phos 38 - 126 U/L 76     AST 15 - 41 U/L 13     ALT 0 - 44 U/L 10  Lab Results  Component Value Date   WBC 7.7 10/23/2021   HGB 14.3 10/23/2021   HCT 41.4 10/23/2021   MCV 92.2 10/23/2021   PLT 286 10/23/2021   NEUTROABS 5.5 10/23/2021    ASSESSMENT & PLAN:  Malignant neoplasm involving both nipple and areola of left breast in female, estrogen receptor negative ([place]) 01/31/2021: Left Breast Biopsy: Grade 1-2 IDC (0.5 cm) 03/22/2021: Left Lumpectomy: 1.5 cm IG DCIS 0/1 LN Neg, ER 100%, PR 100%, Her 2 Neg, Ki 67: 15%, (no residual IDC)  (04/28/2019: Right lumpectomy [name]): intermediate grade DCIS, clear margins ER 100%, PR 70% Adjuvant radiation at [facility], Christel Mormon antiestrogen therapy at that time)  Recommendations: 1. Adjuvant radiation therapy in [place] completed 07/11/2021 2. Adjuvant antiestrogen therapy: Letrozole started 08/03/2021 stopped 10/23/21, switching to anastrozole 11/07/2018       Severe fatigue: Lab work 10/23/2021: Hemoglobin 14.3, CBC normal, TSH 1.19, B12 362, blood sugar 186, creatinine 0.72, LFTs normal, vitamin D 54, iron studies showed iron saturation 38%, ferritin 291  Breast cancer surveillance: Breast exam  04/10/2022: Benign Mammogram 11/21/2021: No evidence of malignancy.  Breast density category B  Return to clinic in 1 year for follow-up   No orders of the defined types were placed in this encounter.  The patient has a good understanding of the overall plan. she agrees with it. she will call with any problems that may develop before the next visit here. Total time spent: 30 mins including face to face time and time spent for planning, charting and co-ordination of care   Harriette Ohara, MD 04/10/22    I Gardiner Coins am acting as a Education administrator for [employer]  I have reviewed the above documentation for accuracy and completeness, and I agree with the above.

## 2022-04-10 NOTE — Assessment & Plan Note (Signed)
01/31/2021: Left Breast Biopsy: Grade 1-2 IDC (0.5 cm) 03/22/2021: Left Lumpectomy: 1.5 cm IG DCIS 0/1 LN Neg, ER 100%, PR 100%, Her 2 Neg, Ki 67: 15%, (no residual IDC)  (04/28/2019: Right lumpectomy [name]): intermediate grade DCIS, clear margins ER 100%, PR 70% Adjuvant radiation at [facility], Christel Mormon antiestrogen therapy at that time)  Recommendations: 1. Adjuvant radiation therapy in [place] completed 07/11/2021 2. Adjuvant antiestrogen therapy: Letrozole started 08/03/2021 stopped 10/23/21, switching to anastrozole 11/07/2018       Severe fatigue: Lab work 10/23/2021: Hemoglobin 14.3, CBC normal, TSH 1.19, B12 362, blood sugar 186, creatinine 0.72, LFTs normal, vitamin D 54, iron studies showed iron saturation 38%, ferritin 291  Breast cancer surveillance: Breast exam 04/10/2022: Benign Mammogram 11/21/2021: No evidence of malignancy.  Breast density category B  Return to clinic in 1 year for follow-up

## 2022-05-27 DIAGNOSIS — E162 Hypoglycemia, unspecified: Secondary | ICD-10-CM | POA: Diagnosis not present

## 2022-05-27 DIAGNOSIS — H6990 Unspecified Eustachian tube disorder, unspecified ear: Secondary | ICD-10-CM | POA: Diagnosis not present

## 2022-05-27 DIAGNOSIS — B370 Candidal stomatitis: Secondary | ICD-10-CM | POA: Diagnosis not present

## 2022-05-27 DIAGNOSIS — B3731 Acute candidiasis of vulva and vagina: Secondary | ICD-10-CM | POA: Diagnosis not present

## 2022-05-27 DIAGNOSIS — R5383 Other fatigue: Secondary | ICD-10-CM | POA: Diagnosis not present

## 2022-05-27 DIAGNOSIS — J069 Acute upper respiratory infection, unspecified: Secondary | ICD-10-CM | POA: Diagnosis not present

## 2022-05-27 DIAGNOSIS — B3781 Candidal esophagitis: Secondary | ICD-10-CM | POA: Diagnosis not present

## 2022-05-27 DIAGNOSIS — R030 Elevated blood-pressure reading, without diagnosis of hypertension: Secondary | ICD-10-CM | POA: Diagnosis not present

## 2022-06-16 DIAGNOSIS — I10 Essential (primary) hypertension: Secondary | ICD-10-CM | POA: Diagnosis not present

## 2022-06-16 DIAGNOSIS — E1165 Type 2 diabetes mellitus with hyperglycemia: Secondary | ICD-10-CM | POA: Diagnosis not present

## 2022-06-16 DIAGNOSIS — E782 Mixed hyperlipidemia: Secondary | ICD-10-CM | POA: Diagnosis not present

## 2022-06-16 DIAGNOSIS — E1122 Type 2 diabetes mellitus with diabetic chronic kidney disease: Secondary | ICD-10-CM | POA: Diagnosis not present

## 2022-06-16 DIAGNOSIS — E7849 Other hyperlipidemia: Secondary | ICD-10-CM | POA: Diagnosis not present

## 2022-06-19 DIAGNOSIS — R5381 Other malaise: Secondary | ICD-10-CM | POA: Diagnosis not present

## 2022-06-19 DIAGNOSIS — M1810 Unilateral primary osteoarthritis of first carpometacarpal joint, unspecified hand: Secondary | ICD-10-CM | POA: Diagnosis not present

## 2022-06-19 DIAGNOSIS — E1165 Type 2 diabetes mellitus with hyperglycemia: Secondary | ICD-10-CM | POA: Diagnosis not present

## 2022-06-19 DIAGNOSIS — E7849 Other hyperlipidemia: Secondary | ICD-10-CM | POA: Diagnosis not present

## 2022-06-19 DIAGNOSIS — C50919 Malignant neoplasm of unspecified site of unspecified female breast: Secondary | ICD-10-CM | POA: Diagnosis not present

## 2022-06-19 DIAGNOSIS — I10 Essential (primary) hypertension: Secondary | ICD-10-CM | POA: Diagnosis not present

## 2022-06-19 DIAGNOSIS — Z8739 Personal history of other diseases of the musculoskeletal system and connective tissue: Secondary | ICD-10-CM | POA: Diagnosis not present

## 2022-07-01 NOTE — Progress Notes (Signed)
Patient Care Team: Sasser, Clarene Critchley, MD as PCP - General (Family Medicine) Kathleene Hazel, MD as PCP - Cardiology (Cardiology) Serena Croissant, MD as Consulting Physician (Hematology and Oncology) Griselda Miner, MD as Consulting Physician (General Surgery) Nils Pyle, MD as Referring Physician (Radiation Oncology)  DIAGNOSIS: No diagnosis found.  SUMMARY OF ONCOLOGIC HISTORY: Oncology History  Ductal carcinoma in situ (DCIS) of right breast  12/27/2018 Initial Diagnosis   Patient noted bilateral dark red spontaneous nipple discharge and a left breast lump palpable on exam. Mammogram showed calcifications in the right breast spanning 3.59mm and no correlate for the previously palpable left breast lump. Breast MRI showed no evidence of malignancy bilaterally. Right breast biopsy showed intermediate grade DCIS, ER+ 100%, PR+ 70%.    03/01/2019 Genetic Testing   Negative genetic testing:  No pathogenic variants detected on the [facility] Breast Cancer STAT panel or the [facility] Multi-Cancer panel. A variant of uncertain significance was detected in the APC gene called c.449A>G. The report date is 03/01/2019.  The Breast Cancer STAT panel offered by [facility] includes sequencing and rearrangement analysis for the following 9 genes:  ATM, BRCA1, BRCA2, CDH1, CHEK2, PALB2, PTEN, STK11 and TP53.  The Multi-Cancer Panel offered by [facility] includes sequencing and/or deletion duplication testing of the following 85 genes: AIP, ALK, APC, ATM, AXIN2,BAP1,  BARD1, BLM, BMPR1A, BRCA1, BRCA2, BRIP1, CASR, CDC73, CDH1, CDK4, CDKN1B, CDKN1C, CDKN2A (p14ARF), CDKN2A (p16INK4a), CEBPA, CHEK2, CTNNA1, DICER1, DIS3L2, EGFR (c.2369C>T, p.Thr790Met variant only), EPCAM (Deletion/duplication testing only), FH, FLCN, GATA2, GPC3, GREM1 (Promoter region deletion/duplication testing only), HOXB13 (c.251G>A, p.Gly84Glu), HRAS, KIT, MAX, MEN1, MET, MITF (c.952G>A, p.Glu318Lys variant only), MLH1, MSH2, MSH3, MSH6,  MUTYH, NBN, NF1, NF2, NTHL1, PALB2, PDGFRA, PHOX2B, PMS2, POLD1, POLE, POT1, PRKAR1A, PTCH1, PTEN, RAD50, RAD51C, RAD51D, RB1, RECQL4, RET, RNF43, RUNX1, SDHAF2, SDHA (sequence changes only), SDHB, SDHC, SDHD, SMAD4, SMARCA4, SMARCB1, SMARCE1, STK11, SUFU, TERC, TERT, TMEM127, TP53, TSC1, TSC2, VHL, WRN and WT1.     04/28/2019 Cancer Staging   Staging form: Breast, AJCC 8th Edition - Pathologic stage from 04/28/2019: Stage 0 (pTis (DCIS), pN0, cM0, ER+, PR+)   04/28/2019 Surgery   Right lumpectomy [name]) [phone]): intermediate grade DCIS, clear margins. No regional lymph nodes were examined.   06/16/2019 - 07/11/2019 Radiation Therapy   Radiation at [facility].   07/2019 - 07/2024 Anti-estrogen oral therapy   Tamoxifen   Malignant neoplasm involving both nipple and areola of left breast in female, estrogen receptor negative  01/31/2021 Initial Diagnosis   Left Breast Biopsy: Grade 1-2 IDC (0.5 cm), ER positive, PR positive, Ki-67 15%, HER2 IHC 2+, FISH negative    01/31/2021 Cancer Staging   Staging form: Breast, AJCC 8th Edition - Clinical stage from 01/31/2021: Stage IA (cT1a, cN0, cM0, G2, ER+, PR+, HER2-) - Signed by Loa Socks, NP on 10/23/2021 Histologic grading system: 3 grade system   03/22/2021 Surgery   Left Lumpectomy: 1.5 cm  DCIS, no invasive cancer, 0/1 LN Neg, ER 100%, PR 100%, Her 2 Neg, Ki 67: 15%, (no residual IDC)   06/13/2021 - 07/11/2021 Radiation Therapy   Adjuvant radiation in [facility]   07/2021 -  Anti-estrogen oral therapy   Letrozole      CHIEF COMPLIANT: Right breast DCIS   INTERVAL HISTORY: Miranda Fuller is a  72 y.o. with above-mentioned history of right breast DCIS having undergone lumpectomy and radiation. Currently on anastrozole. She presents to the clinic today for a follow-up.    ALLERGIES:  is  allergic to oxycodone-acetaminophen, propoxyphene, demerol [meperidine], morphine and related, oxycodone-aspirin, and chlorhexidine.  MEDICATIONS:   Current Outpatient Medications  Medication Sig Dispense Refill   anastrozole (ARIMIDEX) 1 MG tablet Take 1/2 tablet (0.5 mg total) by mouth daily. 30 tablet 6   aspirin 81 MG tablet Take 1 tablet by mouth daily.     Calcium Carbonate-Vit D-Min (CALCIUM 1200 PO) Take 1,200 mg by mouth 2 (two) times daily.     Cholecalciferol (VITAMIN D-3) 25 MCG (1000 UT) CAPS Take 3 capsules by mouth daily.     ezetimibe (ZETIA) 10 MG tablet Take 10 mg by mouth daily.     insulin aspart (NOVOLOG) 100 UNIT/ML injection Inject 5-15 Units into the skin 3 (three) times daily with meals. 5-15 units on sliding scale      insulin glargine (LANTUS) 100 UNIT/ML injection Inject 60 Units into the skin at bedtime.      lisinopril-hydrochlorothiazide (ZESTORETIC) 10-12.5 MG tablet Take 1 tablet by mouth daily.     Multiple Vitamins-Minerals (MULTIVITAMIN ADULT PO) Take by mouth daily.     Pediatric Multivitamins-Iron Kirke Corin W/IRON PO) Take 2 tablets by mouth daily.     Semaglutide,0.25 or 0.5MG /DOS, (OZEMPIC, 0.25 OR 0.5 MG/DOSE,) 2 MG/1.5ML SOPN Inject 0.5 mg into the skin once a week.     venlafaxine XR (EFFEXOR-XR) 75 MG 24 hr capsule Take 75 mg by mouth daily.     vitamin B-12 (CYANOCOBALAMIN) 500 MCG tablet Take 1,000 mcg by mouth 2 (two) times daily.     No current facility-administered medications for this visit.    PHYSICAL EXAMINATION: ECOG PERFORMANCE STATUS: {CHL ONC ECOG PS:[phone]}  There were no vitals filed for this visit. There were no vitals filed for this visit.  BREAST:*** No palpable masses or nodules in either right or left breasts. No palpable axillary supraclavicular or infraclavicular adenopathy no breast tenderness or nipple discharge. (exam performed in the presence of a chaperone)  LABORATORY DATA:  I have reviewed the data as listed    Latest Ref Rng & Units 10/23/2021   10:59 AM 03/15/2021    9:32 AM 04/25/2019    9:30 AM  CMP  Glucose 70 - 99 mg/dL 161  096  045   BUN 8 -  23 mg/dL Creatinine 0.44 - 1.00 mg/dL 4.09  8.11  9.14   Sodium 135 - 145 mmol/L 138  137  137   Potassium 3.5 - 5.1 mmol/L 4.0  4.3  4.3   Chloride 98 - 111 mmol/L 101  101  105   CO2 22 - 32 mmol/L Calcium 8.9 - 10.3 mg/dL 9.6  9.0  9.0   Total Protein 6.5 - 8.1 g/dL 7.6     Total Bilirubin 0.3 - 1.2 mg/dL 0.5     Alkaline Phos 38 - 126 U/L 76     AST 15 - 41 U/L 13     ALT 0 - 44 U/L 10       Lab Results  Component Value Date   WBC 7.7 10/23/2021   HGB 14.3 10/23/2021   HCT 41.4 10/23/2021   MCV 92.2 10/23/2021   PLT 286 10/23/2021   NEUTROABS 5.5 10/23/2021    ASSESSMENT & PLAN:  No problem-specific Assessment & Plan notes found for this encounter.    No orders of the defined types were placed in this encounter.  The patient has a good understanding of the overall  plan. she agrees with it. she will call with any problems that may develop before the next visit here. Total time spent: 30 mins including face to face time and time spent for planning, charting and co-ordination of care   Sherlyn Lick, CMA 07/01/22    I Janan Ridge am acting as a Neurosurgeon for [employer]  ***

## 2022-07-02 ENCOUNTER — Inpatient Hospital Stay: Payer: Medicare Other | Attending: Hematology and Oncology | Admitting: Hematology and Oncology

## 2022-07-02 ENCOUNTER — Other Ambulatory Visit: Payer: Self-pay

## 2022-07-02 VITALS — BP 164/68 | HR 80 | Temp 97.8°F | Resp 18 | Ht 59.0 in | Wt 201.9 lb

## 2022-07-02 DIAGNOSIS — C50112 Malignant neoplasm of central portion of left female breast: Secondary | ICD-10-CM | POA: Insufficient documentation

## 2022-07-02 DIAGNOSIS — Z79811 Long term (current) use of aromatase inhibitors: Secondary | ICD-10-CM | POA: Diagnosis not present

## 2022-07-02 DIAGNOSIS — D0511 Intraductal carcinoma in situ of right breast: Secondary | ICD-10-CM

## 2022-07-02 DIAGNOSIS — Z923 Personal history of irradiation: Secondary | ICD-10-CM | POA: Diagnosis not present

## 2022-07-02 DIAGNOSIS — Z171 Estrogen receptor negative status [ER-]: Secondary | ICD-10-CM | POA: Insufficient documentation

## 2022-07-02 NOTE — Assessment & Plan Note (Addendum)
01/31/2021: Left Breast Biopsy: Grade 1-2 IDC (0.5 cm) 03/22/2021: Left Lumpectomy: 1.5 cm IG DCIS 0/1 LN Neg, ER 100%, PR 100%, Her 2 Neg, Ki 67: 15%, (no residual IDC)   (04/28/2019: Right lumpectomy [name]): intermediate grade DCIS, clear margins ER 100%, PR 70% Adjuvant radiation at [facility], [name] antiestrogen therapy at that time)   Recommendations: 1. Adjuvant radiation therapy in [place] completed 07/11/2021 2. Adjuvant antiestrogen therapy: Letrozole started 08/03/2021 stopped 10/23/21, switched to anastrozole 11/06/2021     Severe fatigue: Lab work 10/23/2021: Hemoglobin 14.3, CBC normal, TSH 1.19, B12 362, blood sugar 186, creatinine 0.72, LFTs normal, vitamin D 54, iron studies showed iron saturation 38%, ferritin 291   Breast cancer surveillance: Breast exam 07/02/2022: Benign Mammogram 11/21/2021: No evidence of malignancy.  Breast density category B   Return to clinic in 1 year for follow-up

## 2022-07-08 ENCOUNTER — Ambulatory Visit
Admission: RE | Admit: 2022-07-08 | Discharge: 2022-07-08 | Disposition: A | Discharge status: Home / Self Care | Payer: Medicare Other | Source: Ambulatory Visit | Attending: Hematology and Oncology | Admitting: Hematology and Oncology

## 2022-07-08 DIAGNOSIS — R921 Mammographic calcification found on diagnostic imaging of breast: Secondary | ICD-10-CM | POA: Diagnosis not present

## 2022-07-08 DIAGNOSIS — N6489 Other specified disorders of breast: Secondary | ICD-10-CM | POA: Diagnosis not present

## 2022-07-08 DIAGNOSIS — D0511 Intraductal carcinoma in situ of right breast: Secondary | ICD-10-CM

## 2022-07-08 DIAGNOSIS — Z853 Personal history of malignant neoplasm of breast: Secondary | ICD-10-CM | POA: Diagnosis not present

## 2022-10-15 DIAGNOSIS — E1165 Type 2 diabetes mellitus with hyperglycemia: Secondary | ICD-10-CM | POA: Diagnosis not present

## 2022-10-15 DIAGNOSIS — I10 Essential (primary) hypertension: Secondary | ICD-10-CM | POA: Diagnosis not present

## 2022-10-22 DIAGNOSIS — E1165 Type 2 diabetes mellitus with hyperglycemia: Secondary | ICD-10-CM | POA: Diagnosis not present

## 2022-10-22 DIAGNOSIS — E7849 Other hyperlipidemia: Secondary | ICD-10-CM | POA: Diagnosis not present

## 2022-10-22 DIAGNOSIS — E1122 Type 2 diabetes mellitus with diabetic chronic kidney disease: Secondary | ICD-10-CM | POA: Diagnosis not present

## 2022-10-22 DIAGNOSIS — I10 Essential (primary) hypertension: Secondary | ICD-10-CM | POA: Diagnosis not present

## 2022-11-06 DIAGNOSIS — M1810 Unilateral primary osteoarthritis of first carpometacarpal joint, unspecified hand: Secondary | ICD-10-CM | POA: Diagnosis not present

## 2022-11-06 DIAGNOSIS — C50919 Malignant neoplasm of unspecified site of unspecified female breast: Secondary | ICD-10-CM | POA: Diagnosis not present

## 2022-11-06 DIAGNOSIS — I10 Essential (primary) hypertension: Secondary | ICD-10-CM | POA: Diagnosis not present

## 2022-11-06 DIAGNOSIS — E7849 Other hyperlipidemia: Secondary | ICD-10-CM | POA: Diagnosis not present

## 2022-11-06 DIAGNOSIS — Z8739 Personal history of other diseases of the musculoskeletal system and connective tissue: Secondary | ICD-10-CM | POA: Diagnosis not present

## 2022-11-06 DIAGNOSIS — E1165 Type 2 diabetes mellitus with hyperglycemia: Secondary | ICD-10-CM | POA: Diagnosis not present

## 2022-11-06 DIAGNOSIS — R5381 Other malaise: Secondary | ICD-10-CM | POA: Diagnosis not present

## 2022-12-18 ENCOUNTER — Other Ambulatory Visit: Payer: Self-pay | Admitting: Hematology and Oncology

## 2022-12-18 DIAGNOSIS — Z853 Personal history of malignant neoplasm of breast: Secondary | ICD-10-CM

## 2023-01-15 DIAGNOSIS — M1810 Unilateral primary osteoarthritis of first carpometacarpal joint, unspecified hand: Secondary | ICD-10-CM | POA: Diagnosis not present

## 2023-01-15 DIAGNOSIS — E1122 Type 2 diabetes mellitus with diabetic chronic kidney disease: Secondary | ICD-10-CM | POA: Diagnosis not present

## 2023-01-28 DIAGNOSIS — E113392 Type 2 diabetes mellitus with moderate nonproliferative diabetic retinopathy without macular edema, left eye: Secondary | ICD-10-CM | POA: Diagnosis not present

## 2023-01-28 DIAGNOSIS — Z794 Long term (current) use of insulin: Secondary | ICD-10-CM | POA: Diagnosis not present

## 2023-01-28 DIAGNOSIS — E113491 Type 2 diabetes mellitus with severe nonproliferative diabetic retinopathy without macular edema, right eye: Secondary | ICD-10-CM | POA: Diagnosis not present

## 2023-01-28 DIAGNOSIS — H35033 Hypertensive retinopathy, bilateral: Secondary | ICD-10-CM | POA: Diagnosis not present

## 2023-02-18 ENCOUNTER — Ambulatory Visit
Admission: RE | Admit: 2023-02-18 | Discharge: 2023-02-18 | Disposition: A | Discharge status: Home / Self Care | Payer: Medicare Other | Source: Ambulatory Visit | Attending: Hematology and Oncology | Admitting: Hematology and Oncology

## 2023-02-18 ENCOUNTER — Ambulatory Visit
Admission: RE | Admit: 2023-02-18 | Discharge: 2023-02-18 | Disposition: A | Discharge status: Home / Self Care | Payer: Medicare Other | Source: Ambulatory Visit | Attending: Hematology and Oncology

## 2023-02-18 ENCOUNTER — Other Ambulatory Visit: Payer: Self-pay | Admitting: Hematology and Oncology

## 2023-02-18 DIAGNOSIS — N6312 Unspecified lump in the right breast, upper inner quadrant: Secondary | ICD-10-CM | POA: Diagnosis not present

## 2023-02-18 DIAGNOSIS — N6321 Unspecified lump in the left breast, upper outer quadrant: Secondary | ICD-10-CM | POA: Diagnosis not present

## 2023-02-18 DIAGNOSIS — Z853 Personal history of malignant neoplasm of breast: Secondary | ICD-10-CM

## 2023-02-25 DIAGNOSIS — R5383 Other fatigue: Secondary | ICD-10-CM | POA: Diagnosis not present

## 2023-02-25 DIAGNOSIS — E1165 Type 2 diabetes mellitus with hyperglycemia: Secondary | ICD-10-CM | POA: Diagnosis not present

## 2023-02-25 DIAGNOSIS — R739 Hyperglycemia, unspecified: Secondary | ICD-10-CM | POA: Diagnosis not present

## 2023-02-25 DIAGNOSIS — E7849 Other hyperlipidemia: Secondary | ICD-10-CM | POA: Diagnosis not present

## 2023-02-26 DIAGNOSIS — E1165 Type 2 diabetes mellitus with hyperglycemia: Secondary | ICD-10-CM | POA: Diagnosis not present

## 2023-02-26 DIAGNOSIS — E1122 Type 2 diabetes mellitus with diabetic chronic kidney disease: Secondary | ICD-10-CM | POA: Diagnosis not present

## 2023-02-26 DIAGNOSIS — E7849 Other hyperlipidemia: Secondary | ICD-10-CM | POA: Diagnosis not present

## 2023-02-26 DIAGNOSIS — I10 Essential (primary) hypertension: Secondary | ICD-10-CM | POA: Diagnosis not present

## 2023-03-04 DIAGNOSIS — I10 Essential (primary) hypertension: Secondary | ICD-10-CM | POA: Diagnosis not present

## 2023-03-04 DIAGNOSIS — R55 Syncope and collapse: Secondary | ICD-10-CM | POA: Diagnosis not present

## 2023-03-04 DIAGNOSIS — R5381 Other malaise: Secondary | ICD-10-CM | POA: Diagnosis not present

## 2023-03-04 DIAGNOSIS — R0789 Other chest pain: Secondary | ICD-10-CM | POA: Diagnosis not present

## 2023-03-04 DIAGNOSIS — E7849 Other hyperlipidemia: Secondary | ICD-10-CM | POA: Diagnosis not present

## 2023-03-04 DIAGNOSIS — Z1389 Encounter for screening for other disorder: Secondary | ICD-10-CM | POA: Diagnosis not present

## 2023-03-04 DIAGNOSIS — Z8739 Personal history of other diseases of the musculoskeletal system and connective tissue: Secondary | ICD-10-CM | POA: Diagnosis not present

## 2023-03-04 DIAGNOSIS — E1165 Type 2 diabetes mellitus with hyperglycemia: Secondary | ICD-10-CM | POA: Diagnosis not present

## 2023-03-04 DIAGNOSIS — N3090 Cystitis, unspecified without hematuria: Secondary | ICD-10-CM | POA: Diagnosis not present

## 2023-03-04 DIAGNOSIS — C50919 Malignant neoplasm of unspecified site of unspecified female breast: Secondary | ICD-10-CM | POA: Diagnosis not present

## 2023-03-20 DIAGNOSIS — E7849 Other hyperlipidemia: Secondary | ICD-10-CM | POA: Diagnosis not present

## 2023-03-20 DIAGNOSIS — Z0001 Encounter for general adult medical examination with abnormal findings: Secondary | ICD-10-CM | POA: Diagnosis not present

## 2023-03-20 DIAGNOSIS — E1165 Type 2 diabetes mellitus with hyperglycemia: Secondary | ICD-10-CM | POA: Diagnosis not present

## 2023-03-20 DIAGNOSIS — I10 Essential (primary) hypertension: Secondary | ICD-10-CM | POA: Diagnosis not present

## 2023-04-14 ENCOUNTER — Inpatient Hospital Stay: Payer: Medicare Other | Attending: Hematology and Oncology | Admitting: Hematology and Oncology

## 2023-04-14 NOTE — Assessment & Plan Note (Deleted)
01/31/2021: Left Breast Biopsy: Grade 1-2 IDC (0.5 cm) 03/22/2021: Left Lumpectomy: 1.5 cm IG DCIS 0/1 LN Neg, ER 100%, PR 100%, Her 2 Neg, Ki 67: 15%, (no residual IDC)   (04/28/2019: Right lumpectomy [name]): intermediate grade DCIS, clear margins ER 100%, PR 70% Adjuvant radiation at [facility], [name] antiestrogen therapy at that time)   Recommendations: 1. Adjuvant radiation therapy in [place] completed 07/11/2021 2. Adjuvant antiestrogen therapy: Letrozole started 08/03/2021 stopped 10/23/21, switched to anastrozole 11/06/2021, currently taking half a tablet daily.   Anastrozole toxicities: Hot flashes and night sweats   Severe fatigue: Lab work 10/23/2021: Hemoglobin 14.3, CBC normal, TSH 1.19, B12 362, blood sugar 186, creatinine 0.72, LFTs normal, vitamin D 54, iron studies showed iron saturation 38%, ferritin 291   Breast cancer surveillance: Breast exam 04/14/2023: Palpable areas of concern: Mammogram ultrasound: No abnormalities detected Mammogram 02/18/2023: No evidence of malignancy.  Breast density category C

## 2023-04-22 ENCOUNTER — Other Ambulatory Visit: Payer: Self-pay | Admitting: Hematology and Oncology

## 2023-04-24 ENCOUNTER — Telehealth: Payer: Self-pay | Admitting: Hematology and Oncology

## 2023-04-24 NOTE — Telephone Encounter (Signed)
 Called patient to schedule April appointment. Patient advised me that she missed her appointment in January as she was sick and asked for a sooner appointment. Patient asked about prescriptions and she was transferred to the Nurse on duty.

## 2023-05-04 NOTE — Assessment & Plan Note (Signed)
01/31/2021: Left Breast Biopsy: Grade 1-2 IDC (0.5 cm) 03/22/2021: Left Lumpectomy: 1.5 cm IG DCIS 0/1 LN Neg, ER 100%, PR 100%, Her 2 Neg, Ki 67: 15%, (no residual IDC)   (04/28/2019: Right lumpectomy [name]): intermediate grade DCIS, clear margins ER 100%, PR 70% Adjuvant radiation at [facility], [name] antiestrogen therapy at that time)   Recommendations: 1. Adjuvant radiation therapy in [place] completed 07/11/2021 2. Adjuvant antiestrogen therapy: Letrozole started 08/03/2021 stopped 10/23/21, switched to anastrozole 11/06/2021, currently taking half a tablet daily.   Anastrozole toxicities: Hot flashes and night sweats   Severe fatigue: Lab work 10/23/2021: Hemoglobin 14.3, CBC normal, TSH 1.19, B12 362, blood sugar 186, creatinine 0.72, LFTs normal, vitamin D 54, iron studies showed iron saturation 38%, ferritin 291   Breast cancer surveillance: Breast exam 05/05/23: Benign Mammogram 02/18/23: No evidence of malignancy.  Breast density category B  RTC in 1 year

## 2023-05-05 ENCOUNTER — Inpatient Hospital Stay: Payer: Medicare Other | Attending: Hematology and Oncology | Admitting: Hematology and Oncology

## 2023-05-05 VITALS — BP 155/69 | HR 74 | Temp 98.0°F | Resp 17 | Ht 59.0 in | Wt 200.3 lb

## 2023-05-05 DIAGNOSIS — D0511 Intraductal carcinoma in situ of right breast: Secondary | ICD-10-CM

## 2023-05-05 DIAGNOSIS — Z79811 Long term (current) use of aromatase inhibitors: Secondary | ICD-10-CM | POA: Insufficient documentation

## 2023-05-05 DIAGNOSIS — C50012 Malignant neoplasm of nipple and areola, left female breast: Secondary | ICD-10-CM | POA: Diagnosis not present

## 2023-05-05 DIAGNOSIS — Z171 Estrogen receptor negative status [ER-]: Secondary | ICD-10-CM | POA: Insufficient documentation

## 2023-05-05 DIAGNOSIS — Z7985 Long-term (current) use of injectable non-insulin antidiabetic drugs: Secondary | ICD-10-CM | POA: Insufficient documentation

## 2023-05-05 MED ORDER — ANASTROZOLE 1 MG PO TABS: 1.0000 mg | ORAL_TABLET | Freq: Every day | ORAL | 3 refills | Status: DC

## 2023-05-05 NOTE — Progress Notes (Signed)
Patient Care Team: Sasser, Clarene Critchley, MD as PCP - General (Family Medicine) Kathleene Hazel, MD as PCP - Cardiology (Cardiology) Serena Croissant, MD as Consulting Physician (Hematology and Oncology) Griselda Miner, MD as Consulting Physician (General Surgery) Nils Pyle, MD as Referring Physician (Radiation Oncology)  DIAGNOSIS:  Encounter Diagnosis  Name Primary?   Ductal carcinoma in situ (DCIS) of right breast Yes    SUMMARY OF ONCOLOGIC HISTORY: Oncology History  Ductal carcinoma in situ (DCIS) of right breast  12/27/2018 Initial Diagnosis   Patient noted bilateral dark red spontaneous nipple discharge and a left breast lump palpable on exam. Mammogram showed calcifications in the right breast spanning 3.60mm and no correlate for the previously palpable left breast lump. Breast MRI showed no evidence of malignancy bilaterally. Right breast biopsy showed intermediate grade DCIS, ER+ 100%, PR+ 70%.    03/01/2019 Genetic Testing   Negative genetic testing:  No pathogenic variants detected on the [facility] Breast Cancer STAT panel or the [facility] Multi-Cancer panel. A variant of uncertain significance was detected in the APC gene called c.449A>G. The report date is 03/01/2019.  The Breast Cancer STAT panel offered by [facility] includes sequencing and rearrangement analysis for the following 9 genes:  ATM, BRCA1, BRCA2, CDH1, CHEK2, PALB2, PTEN, STK11 and TP53.  The Multi-Cancer Panel offered by [facility] includes sequencing and/or deletion duplication testing of the following 85 genes: AIP, ALK, APC, ATM, AXIN2,BAP1,  BARD1, BLM, BMPR1A, BRCA1, BRCA2, BRIP1, CASR, CDC73, CDH1, CDK4, CDKN1B, CDKN1C, CDKN2A (p14ARF), CDKN2A (p16INK4a), CEBPA, CHEK2, CTNNA1, DICER1, DIS3L2, EGFR (c.2369C>T, p.Thr790Met variant only), EPCAM (Deletion/duplication testing only), FH, FLCN, GATA2, GPC3, GREM1 (Promoter region deletion/duplication testing only), HOXB13 (c.251G>A, p.Gly84Glu), HRAS, KIT, MAX, MEN1,  MET, MITF (c.952G>A, p.Glu318Lys variant only), MLH1, MSH2, MSH3, MSH6, MUTYH, NBN, NF1, NF2, NTHL1, PALB2, PDGFRA, PHOX2B, PMS2, POLD1, POLE, POT1, PRKAR1A, PTCH1, PTEN, RAD50, RAD51C, RAD51D, RB1, RECQL4, RET, RNF43, RUNX1, SDHAF2, SDHA (sequence changes only), SDHB, SDHC, SDHD, SMAD4, SMARCA4, SMARCB1, SMARCE1, STK11, SUFU, TERC, TERT, TMEM127, TP53, TSC1, TSC2, VHL, WRN and WT1.     04/28/2019 Cancer Staging   Staging form: Breast, AJCC 8th Edition - Pathologic stage from 04/28/2019: Stage 0 (pTis (DCIS), pN0, cM0, ER+, PR+)   04/28/2019 Surgery   Right lumpectomy [name]) [phone]): intermediate grade DCIS, clear margins. No regional lymph nodes were examined.   06/16/2019 - 07/11/2019 Radiation Therapy   Radiation at [facility].   07/2019 - 07/2024 Anti-estrogen oral therapy   Tamoxifen   Malignant neoplasm involving both nipple and areola of left breast in female, estrogen receptor negative (HCC)  01/31/2021 Initial Diagnosis   Left Breast Biopsy: Grade 1-2 IDC (0.5 cm), ER positive, PR positive, Ki-67 15%, HER2 IHC 2+, FISH negative    01/31/2021 Cancer Staging   Staging form: Breast, AJCC 8th Edition - Clinical stage from 01/31/2021: Stage IA (cT1a, cN0, cM0, G2, ER+, PR+, HER2-) - Signed by Loa Socks, NP on 10/23/2021 Histologic grading system: 3 grade system   03/22/2021 Surgery   Left Lumpectomy: 1.5 cm  DCIS, no invasive cancer, 0/1 LN Neg, ER 100%, PR 100%, Her 2 Neg, Ki 67: 15%, (no residual IDC)   06/13/2021 - 07/11/2021 Radiation Therapy   Adjuvant radiation in [facility]   07/2021 -  Anti-estrogen oral therapy   Letrozole      CHIEF COMPLIANT: Follow-up on letrozole therapy  HISTORY OF PRESENT ILLNESS:   History of Present Illness   Miranda Fuller is a 73 year old female with breast  cancer who presents for follow-up regarding her medication and symptoms. She is accompanied by her granddaughter, [place].  She has been on breast cancer medication for almost two  years, taking half a tablet. She experiences night sweats approximately once a week, which she attributes to the medication. Her last mammogram in December showed some breast density (C category), but no abnormalities were detected.  She feels very tired all the time and experiences shortness of breath with minimal exertion, such as doing housework. She needs to sit down frequently to rest but is able to perform daily activities like cooking, cleaning, and driving, albeit more slowly due to fatigue.  She experienced a fainting episode in her yard before Thanksgiving, after which an EKG indicated a blockage in the left ventricle. She has not had another fainting episode since but continues to feel dizzy and tired.  She is currently taking venlafaxine, though the dose was not specified. She also takes Ozempic, which she has been on for a year at a dose of 1.0 mg. She reports not noticing any change with Ozempic and mentions feeling hungry shortly after taking NovoLog.         ALLERGIES:  is allergic to oxycodone-acetaminophen, propoxyphene, demerol [meperidine], morphine and codeine, oxycodone-aspirin, and chlorhexidine.  MEDICATIONS:  Current Outpatient Medications  Medication Sig Dispense Refill   anastrozole (ARIMIDEX) 1 MG tablet Take 1 tablet (1 mg total) by mouth daily. 45 tablet 3   aspirin 81 MG tablet Take 1 tablet by mouth daily.     Aspirin-Calcium Carbonate (BAYER WOMENS) [phone] MG TABS Take by mouth.     calcium carbonate (OS-CAL) 1250 (500 Ca) MG chewable tablet Chew by mouth.     Calcium Carbonate-Vit D-Min (CALCIUM 1200 PO) Take 1,200 mg by mouth 2 (two) times daily.     Cholecalciferol (VITAMIN D-3) 25 MCG (1000 UT) CAPS Take 3 capsules by mouth daily.     ezetimibe (ZETIA) 10 MG tablet Take 10 mg by mouth daily.     insulin aspart (NOVOLOG) 100 UNIT/ML injection Inject 5-15 Units into the skin 3 (three) times daily with meals. 5-15 units on sliding scale      insulin glargine  (LANTUS) 100 UNIT/ML injection Inject 50 Units into the skin at bedtime.     lisinopril-hydrochlorothiazide (ZESTORETIC) 10-12.5 MG tablet Take 1 tablet by mouth daily.     MAGNESIUM PO Take by mouth.     Multiple Vitamins-Minerals (MULTIVITAMIN ADULT PO) Take by mouth daily.     Multiple Vitamins/Iron TABS Take by mouth.     pantoprazole (PROTONIX) 40 MG tablet Take by mouth.     Pediatric Multivitamins-Iron Kirke Corin W/IRON PO) Take 2 tablets by mouth daily.     Semaglutide,0.25 or 0.5MG /DOS, (OZEMPIC, 0.25 OR 0.5 MG/DOSE,) 2 MG/1.5ML SOPN Inject 0.5 mg into the skin once a week.     venlafaxine XR (EFFEXOR-XR) 75 MG 24 hr capsule Take 75 mg by mouth daily.     vitamin B-12 (CYANOCOBALAMIN) 500 MCG tablet Take 1,000 mcg by mouth 2 (two) times daily.     No current facility-administered medications for this visit.    PHYSICAL EXAMINATION: ECOG PERFORMANCE STATUS: 1 - Symptomatic but completely ambulatory  Vitals:   05/05/23 1152  BP: (!) 155/69  Pulse: 74  Resp: 17  Temp: 98 F (36.7 C)  SpO2: 100%   Filed Weights   05/05/23 1152  Weight: 200 lb 4.8 oz (90.9 kg)    Physical Exam   BREAST: Breasts symmetrical, no masses,  no tenderness.      (exam performed in the presence of a chaperone)  LABORATORY DATA:  I have reviewed the data as listed    Latest Ref Rng & Units 10/23/2021   10:59 AM 03/15/2021    9:32 AM 04/25/2019    9:30 AM  CMP  Glucose 70 - 99 mg/dL 130  865  784   BUN 8 - 23 mg/dL 26  16  22    Creatinine 0.44 - 1.00 mg/dL 6.96  2.95  2.84   Sodium 135 - 145 mmol/L 138  137  137   Potassium 3.5 - 5.1 mmol/L 4.0  4.3  4.3   Chloride 98 - 111 mmol/L 101  101  105   CO2 22 - 32 mmol/L 30  27  23    Calcium 8.9 - 10.3 mg/dL 9.6  9.0  9.0   Total Protein 6.5 - 8.1 g/dL 7.6     Total Bilirubin 0.3 - 1.2 mg/dL 0.5     Alkaline Phos 38 - 126 U/L 76     AST 15 - 41 U/L 13     ALT 0 - 44 U/L 10       Lab Results  Component Value Date   WBC 7.7 10/23/2021    HGB 14.3 10/23/2021   HCT 41.4 10/23/2021   MCV 92.2 10/23/2021   PLT 286 10/23/2021   NEUTROABS 5.5 10/23/2021    ASSESSMENT & PLAN:  Ductal carcinoma in situ (DCIS) of right breast 01/31/2021: Left Breast Biopsy: Grade 1-2 IDC (0.5 cm) 03/22/2021: Left Lumpectomy: 1.5 cm IG DCIS 0/1 LN Neg, ER 100%, PR 100%, Her 2 Neg, Ki 67: 15%, (no residual IDC)   (04/28/2019: Right lumpectomy [name]): intermediate grade DCIS, clear margins ER 100%, PR 70% Adjuvant radiation at [facility], Refused antiestrogen therapy at that time)   Recommendations: 1. Adjuvant radiation therapy in [place] completed 07/11/2021 2. Adjuvant antiestrogen therapy: Letrozole started 08/03/2021 stopped 10/23/21, switched to anastrozole 11/06/2021, currently taking half a tablet daily.   Anastrozole toxicities: Hot flashes and night sweats   Severe fatigue: Lab work 10/23/2021: Hemoglobin 14.3, CBC normal, TSH 1.19, B12 362, blood sugar 186, creatinine 0.72, LFTs normal, vitamin D 54, iron studies showed iron saturation 38%, ferritin 291   Breast cancer surveillance: Breast exam 05/05/23: Benign Mammogram 02/18/23: No evidence of malignancy.  Breast density category B  RTC in 1 year ------------------------------------- Assessment and Plan    Breast Cancer (Post-Surgical Follow-Up) Two years post-surgery for breast cancer. Currently on venlafaxine (75 mg, half a tablet daily) with occasional night sweats. No new breast masses or abnormalities on physical exam. Last mammogram and ultrasound in December were normal, with some breast density noted (C category). - Perform breast exam - Schedule next mammogram and ultrasound for December Renewed anastrozole  Cardiac Symptoms   Reported syncope episode before Thanksgiving, with subsequent EKG indicating left ventricular blockage. Experiences dizziness and fatigue but no recurrent syncope. Awaiting cardiologist follow-up on the seventh. Dizziness unlikely related to breast cancer  medication. - Follow up with cardiologist on the seventh  Diabetes Mellitus Currently on Ozempic (1.0 mg) and NovoLog. Reports no significant change with Ozempic over the past year. Experiences hunger shortly after meals despite NovoLog.   General Health Maintenance Reports significant fatigue and shortness of breath with exertion. No regular exercise routine. Encouraged to increase physical activity as tolerated. - Encourage regular physical activity as tolerated  Follow-up - Schedule follow-up visit as needed.  No orders of the defined types were placed in this encounter.  The patient has a good understanding of the overall plan. she agrees with it. she will call with any problems that may develop before the next visit here. Total time spent: 30 mins including face to face time and time spent for planning, charting and co-ordination of care   Tamsen Meek, MD 05/05/23

## 2023-05-06 ENCOUNTER — Other Ambulatory Visit (HOSPITAL_COMMUNITY): Payer: Self-pay

## 2023-05-06 ENCOUNTER — Other Ambulatory Visit: Payer: Self-pay | Admitting: *Deleted

## 2023-05-06 ENCOUNTER — Other Ambulatory Visit (HOSPITAL_BASED_OUTPATIENT_CLINIC_OR_DEPARTMENT_OTHER): Payer: Self-pay

## 2023-05-06 MED ORDER — ANASTROZOLE 1 MG PO TABS
1.0000 mg | ORAL_TABLET | Freq: Every day | ORAL | 3 refills | Status: AC
  Filled 2023-05-06 (×2): qty 45, 45d supply, fill #0

## 2023-05-07 ENCOUNTER — Other Ambulatory Visit: Payer: Self-pay

## 2023-05-07 ENCOUNTER — Other Ambulatory Visit (HOSPITAL_COMMUNITY): Payer: Self-pay

## 2023-05-07 MED ORDER — ANASTROZOLE 1 MG PO TABS
1.0000 mg | ORAL_TABLET | Freq: Every day | ORAL | 3 refills | Status: DC
  Filled 2023-05-07: qty 45, 45d supply, fill #0

## 2023-05-15 ENCOUNTER — Other Ambulatory Visit (HOSPITAL_COMMUNITY): Payer: Self-pay

## 2023-05-15 DIAGNOSIS — E782 Mixed hyperlipidemia: Secondary | ICD-10-CM | POA: Diagnosis not present

## 2023-05-15 DIAGNOSIS — E1122 Type 2 diabetes mellitus with diabetic chronic kidney disease: Secondary | ICD-10-CM | POA: Diagnosis not present

## 2023-05-22 ENCOUNTER — Ambulatory Visit: Payer: Medicare Other | Admitting: Cardiovascular Disease

## 2023-05-22 NOTE — Progress Notes (Deleted)
 No chief complaint on file.  History of Present Illness: 73 yo female with history of CAD, DM, HTN, sleep apnea, obesity, depression, breast cancer and dementia here today as a new patient for the evaluation of ***. She was seen in our office in 2016 by Dr. Antoine Poche and I saw her once in March 2020. She had a cardiac cath in 2011 which showed mild luminal irregularities but no obstructive CAD.  Exercise stress test in 2016 with no ischemic EKG changes. Echo December 2019 with LVEF=60-65%, sclerotic aortic valve without stenosis. Coronary CTA March 2020 with mild to moderate disease in the LAD and Circumflex with calcium score of 304. Positive CT FFR in a small caliber OM branch. She did not return for follow up after this scan.  She tells me today that she ***     Primary Care Physician: Estanislado Pandy, MD   Past Medical History:  Diagnosis Date   Breast mass 12/27/2018   right- cancer, s/p lumpectomy, radiation   Coccidioidomycosis, pulmonary (HCC)    Complication of anesthesia    DDD (degenerative disc disease), cervical    Dementia (HCC)    Early per Dr. Juanetta Gosling   Depression 2009   DM (diabetes mellitus screen) 2009   Family history of bone cancer    Family history of brain cancer    Family history of breast cancer    Family history of esophageal cancer    Family history of lung cancer    Glaucoma    HTN (hypertension)    Morbid obesity (HCC)    Nodule of upper lobe of left lung 12/27/2018   Personal history of radiation therapy    PONV (postoperative nausea and vomiting)    Sleep apnea    had surgery   Type II diabetes mellitus, uncontrolled 01/31/2019    Past Surgical History:  Procedure Laterality Date   ABDOMINAL HYSTERECTOMY     TAH BSO   BREAST BIOPSY Right 02/07/2019   BREAST LUMPECTOMY Right 04/28/2019   BREAST LUMPECTOMY WITH RADIOACTIVE SEED AND SENTINEL LYMPH NODE BIOPSY Left 03/22/2021   Procedure: LEFT BREAST LUMPECTOMY WITH RADIOACTIVE SEED AND SENTINEL  LYMPH NODE BIOPSY;  Surgeon: Griselda Miner, MD;  Location: [facility];  Service: General;  Laterality: Left;   BREAST LUMPECTOMY WITH RADIOACTIVE SEED LOCALIZATION Right 04/28/2019   Procedure: RIGHT BREAST LUMPECTOMY WITH RADIOACTIVE SEED LOCALIZATION;  Surgeon: Griselda Miner, MD;  Location: [facility];  Service: General;  Laterality: Right;   CHOLECYSTECTOMY     LAPAROSCOPIC GASTRIC SLEEVE RESECTION     TUBAL LIGATION     UVULOPALATOPHARYNGOPLASTY      Current Outpatient Medications  Medication Sig Dispense Refill   anastrozole (ARIMIDEX) 1 MG tablet Take 1 tablet (1 mg total) by mouth daily. 45 tablet 3   anastrozole (ARIMIDEX) 1 MG tablet Take 1 tablet (1 mg total) by mouth daily. 45 tablet 3   aspirin 81 MG tablet Take 1 tablet by mouth daily.     Aspirin-Calcium Carbonate (BAYER WOMENS) [phone] MG TABS Take by mouth.     calcium carbonate (OS-CAL) 1250 (500 Ca) MG chewable tablet Chew by mouth.     Calcium Carbonate-Vit D-Min (CALCIUM 1200 PO) Take 1,200 mg by mouth 2 (two) times daily.     Cholecalciferol (VITAMIN D-3) 25 MCG (1000 UT) CAPS Take 3 capsules by mouth daily.     ezetimibe (ZETIA) 10 MG tablet Take 10 mg by mouth daily.  insulin aspart (NOVOLOG) 100 UNIT/ML injection Inject 5-15 Units into the skin 3 (three) times daily with meals. 5-15 units on sliding scale      insulin glargine (LANTUS) 100 UNIT/ML injection Inject 50 Units into the skin at bedtime.     lisinopril-hydrochlorothiazide (ZESTORETIC) 10-12.5 MG tablet Take 1 tablet by mouth daily.     MAGNESIUM PO Take by mouth.     Multiple Vitamins-Minerals (MULTIVITAMIN ADULT PO) Take by mouth daily.     Multiple Vitamins/Iron TABS Take by mouth.     pantoprazole (PROTONIX) 40 MG tablet Take by mouth.     Pediatric Multivitamins-Iron Kirke Corin W/IRON PO) Take 2 tablets by mouth daily.     Semaglutide,0.25 or 0.5MG /DOS, (OZEMPIC, 0.25 OR 0.5 MG/DOSE,) 2 MG/1.5ML SOPN Inject 0.5 mg  into the skin once a week.     venlafaxine XR (EFFEXOR-XR) 75 MG 24 hr capsule Take 75 mg by mouth daily.     vitamin B-12 (CYANOCOBALAMIN) 500 MCG tablet Take 1,000 mcg by mouth 2 (two) times daily.     No current facility-administered medications for this visit.    Allergies  Allergen Reactions   Oxycodone-Acetaminophen Other (See Comments), Itching, Rash and Hives    Other reaction(s): Hallucinations   Propoxyphene Other (See Comments), Hives, Itching and Rash    Other reaction(s): Delusions (intolerance)   Demerol [Meperidine] Other (See Comments)    hallucinations   Morphine And Codeine Hives   Oxycodone-Aspirin Other (See Comments) and Hives   Chlorhexidine Rash    Social History   Socioeconomic History   Marital status: Widowed    Spouse name: Not on file   Number of children: 3   Years of education: Not on file   Highest education level: Not on file  Occupational History   Occupation: Retired-Daycare  Tobacco Use   Smoking status: Never   Smokeless tobacco: Never  Vaping Use   Vaping status: Never Used  Substance and Sexual Activity   Alcohol use: Yes    Comment: social   Drug use: No   Sexual activity: Not Currently    Birth control/protection: Surgical  Other Topics Concern   Not on file  Social History Narrative   Lives at home with children. Married for 48 years.      Retired.   Social Drivers of Corporate investment banker Strain: Not on file  Food Insecurity: Not on file  Transportation Needs: Not on file  Physical Activity: Not on file  Stress: Not on file  Social Connections: Unknown (07/29/2021)   Received from [facility], [facility]   Social Network    Social Network: Not on file  Intimate Partner Violence: Unknown (06/20/2021)   Received from [facility], [facility]   HITS    Physically Hurt: Not on file    Insult or Talk Down To: Not on file    Threaten Physical Harm: Not on file    Scream or Curse: Not on file    Family  History  Problem Relation Age of Onset   Heart disease Mother        "enlarged heart and leaking valves"   COPD Mother    Diabetes Mother    Breast cancer Mother 44   Cancer Mother        cancer involving the colon, esophagus, and liver   Peripheral vascular disease Father 92   Heart disease Father    Diabetes Maternal Grandfather    Polycystic kidney disease Sister  Polycystic kidney disease Brother    Hypertension Daughter    Breast cancer Maternal Grandmother 85   Breast cancer Other 74       maternal grandmother's sister   Brain cancer Other 62   Lung cancer Other     Review of Systems:  As stated in the HPI and otherwise negative.   There were no vitals taken for this visit.  Physical Examination: General: Well developed, well nourished, NAD  HEENT: OP clear, mucus membranes moist  SKIN: warm, dry. No rashes. Neuro: No focal deficits  Musculoskeletal: Muscle strength 5/5 all ext  Psychiatric: Mood and affect normal  Neck: No JVD, no carotid bruits, no thyromegaly, no lymphadenopathy.  Lungs:Clear bilaterally, no wheezes, rhonci, crackles Cardiovascular: Regular rate and rhythm. No murmurs, gallops or rubs. Abdomen:Soft. Bowel sounds present. Non-tender.  Extremities: No lower extremity edema. Pulses are 2 + in the bilateral DP/PT.  EKG:  EKG is *** ordered today. The ekg ordered today demonstrates   Recent Labs: No results found for requested labs within last 365 days.   Lipid Panel    Component Value Date/Time   CHOL  01/30/2010 0305    184        ATP III CLASSIFICATION:  <200     mg/dL   Desirable  811-914  mg/dL   Borderline High  >=782    mg/dL   High          TRIG 956 (H) 01/30/2010 0305   HDL 44 01/30/2010 0305   CHOLHDL 4.2 01/30/2010 0305   VLDL 62 (H) 01/30/2010 0305   LDLCALC  01/30/2010 0305    78        Total Cholesterol/HDL:CHD Risk Coronary Heart Disease Risk Table                     Men   Women  1/2 Average Risk   3.4   3.3   Average Risk       5.0   4.4  2 X Average Risk   9.6   7.1  3 X Average Risk  23.4   11.0        Use the calculated Patient Ratio above and the CHD Risk Table to determine the patient's CHD Risk.        ATP III CLASSIFICATION (LDL):  <100     mg/dL   Optimal  213-086  mg/dL   Near or Above                    Optimal  130-159  mg/dL   Borderline  578-469  mg/dL   High  >629     mg/dL   Very High     Wt Readings from Last 3 Encounters:  05/05/23 90.9 kg  07/02/22 91.6 kg  04/10/22 92.4 kg    Assessment and Plan:   1. CAD without angina:   2.   Labs/ tests ordered today include:  No orders of the defined types were placed in this encounter.    Disposition:   FU with me in 3 months   Signed, Verne Carrow, MD 05/22/2023 6:37 AM    [facility] [address] [place], [place], [place]  [address] Phone: [phone]; Fax: [phone]

## 2023-06-15 DIAGNOSIS — E782 Mixed hyperlipidemia: Secondary | ICD-10-CM | POA: Diagnosis not present

## 2023-06-15 DIAGNOSIS — E1122 Type 2 diabetes mellitus with diabetic chronic kidney disease: Secondary | ICD-10-CM | POA: Diagnosis not present

## 2023-06-22 ENCOUNTER — Ambulatory Visit (INDEPENDENT_AMBULATORY_CARE_PROVIDER_SITE_OTHER)

## 2023-06-22 ENCOUNTER — Encounter: Payer: Self-pay | Admitting: Cardiovascular Disease

## 2023-06-22 ENCOUNTER — Ambulatory Visit: Attending: Cardiovascular Disease | Admitting: Cardiovascular Disease

## 2023-06-22 VITALS — BP 132/68 | HR 89 | Ht 59.0 in | Wt 201.4 lb

## 2023-06-22 DIAGNOSIS — R55 Syncope and collapse: Secondary | ICD-10-CM

## 2023-06-22 DIAGNOSIS — E113392 Type 2 diabetes mellitus with moderate nonproliferative diabetic retinopathy without macular edema, left eye: Secondary | ICD-10-CM | POA: Diagnosis not present

## 2023-06-22 DIAGNOSIS — I25118 Atherosclerotic heart disease of native coronary artery with other forms of angina pectoris: Secondary | ICD-10-CM

## 2023-06-22 DIAGNOSIS — Z794 Long term (current) use of insulin: Secondary | ICD-10-CM | POA: Diagnosis not present

## 2023-06-22 DIAGNOSIS — H35033 Hypertensive retinopathy, bilateral: Secondary | ICD-10-CM | POA: Diagnosis not present

## 2023-06-22 DIAGNOSIS — E113491 Type 2 diabetes mellitus with severe nonproliferative diabetic retinopathy without macular edema, right eye: Secondary | ICD-10-CM | POA: Diagnosis not present

## 2023-06-22 MED ORDER — METOPROLOL TARTRATE 100 MG PO TABS: 100.0000 mg | ORAL_TABLET | Freq: Once | ORAL | 0 refills | Status: DC

## 2023-06-22 NOTE — Progress Notes (Unsigned)
 ZIO serial # [id number] from office inventory applied to patient.

## 2023-06-22 NOTE — Progress Notes (Signed)
 Chief Complaint  Patient presents with   New Patient (Initial Visit)    Chest pain    History of Present Illness: 73 yo female with history of breast cancer, DM, HTN, obesity, sleep apnea and mild dementia who is here today to re-establish cardiology care. I saw her as a new patient in 2020 for the evaluation of chest pain. She had a cardiac cath in 2011 which showed mild luminal irregularities but no obstructive CAD. Exercise stress test in 2016 with no ischemic EKG changes. Echo December 2019 with LVEF=60-65%, sclerotic aortic valve without stenosis. Coronary CTA in March 2020 with mild to moderate non-obstructive disease in the LAD and Circumflex with probable flow limiting disease in the small caliber obtuse marginal branch (small caliber vessel).   She tells me today that she has left sided chest pain. This pain can occur with exertion or at rest. The pain lasts for up to 5 minutes. She has dyspnea when the pain occurs. The pain can occur with exertion. She also describes a syncopal episode in November 2024 and near syncope last month. The first time she was walking in the yard. The last event happened while sitting in her car. Things seemed to go black for a few seconds. No palpitations, lower extremity edema, orthopnea, PND.    Primary Care Physician: Estanislado Pandy, MD   Past Medical History:  Diagnosis Date   Breast mass 12/27/2018   right- cancer, s/p lumpectomy, radiation   Coccidioidomycosis, pulmonary (HCC)    Complication of anesthesia    DDD (degenerative disc disease), cervical    Dementia (HCC)    Early per Dr. Juanetta Gosling   Depression 2009   DM (diabetes mellitus screen) 2009   Family history of bone cancer    Family history of brain cancer    Family history of breast cancer    Family history of esophageal cancer    Family history of lung cancer    Glaucoma    HTN (hypertension)    Morbid obesity (HCC)    Nodule of upper lobe of left lung 12/27/2018   Personal  history of radiation therapy    PONV (postoperative nausea and vomiting)    Sleep apnea    had surgery   Type II diabetes mellitus, uncontrolled 01/31/2019    Past Surgical History:  Procedure Laterality Date   ABDOMINAL HYSTERECTOMY     TAH BSO   BREAST BIOPSY Right 02/07/2019   BREAST LUMPECTOMY Right 04/28/2019   BREAST LUMPECTOMY WITH RADIOACTIVE SEED AND SENTINEL LYMPH NODE BIOPSY Left 03/22/2021   Procedure: LEFT BREAST LUMPECTOMY WITH RADIOACTIVE SEED AND SENTINEL LYMPH NODE BIOPSY;  Surgeon: Griselda Miner, MD;  Location: [facility];  Service: General;  Laterality: Left;   BREAST LUMPECTOMY WITH RADIOACTIVE SEED LOCALIZATION Right 04/28/2019   Procedure: RIGHT BREAST LUMPECTOMY WITH RADIOACTIVE SEED LOCALIZATION;  Surgeon: Griselda Miner, MD;  Location: [facility];  Service: General;  Laterality: Right;   CHOLECYSTECTOMY     LAPAROSCOPIC GASTRIC SLEEVE RESECTION     TUBAL LIGATION     UVULOPALATOPHARYNGOPLASTY      Current Outpatient Medications  Medication Sig Dispense Refill   aspirin 81 MG tablet Take 1 tablet by mouth daily.     calcium carbonate (OS-CAL) 1250 (500 Ca) MG chewable tablet Chew by mouth.     Calcium Carbonate-Vit D-Min (CALCIUM 1200 PO) Take 1,200 mg by mouth 2 (two) times daily.     Cholecalciferol (VITAMIN D-3) 25  MCG (1000 UT) CAPS Take 3 capsules by mouth daily.     ezetimibe (ZETIA) 10 MG tablet Take 10 mg by mouth daily.     insulin aspart (NOVOLOG) 100 UNIT/ML injection Inject 5-15 Units into the skin 3 (three) times daily with meals. 5-15 units on sliding scale      insulin glargine (LANTUS) 100 UNIT/ML injection Inject 50 Units into the skin at bedtime.     lisinopril-hydrochlorothiazide (ZESTORETIC) 10-12.5 MG tablet Take 1 tablet by mouth daily.     MAGNESIUM PO Take by mouth.     metoprolol tartrate (LOPRESSOR) 100 MG tablet Take 1 tablet (100 mg total) by mouth once for 1 dose. Take 90-120 minutes prior to scan.  Hold for SBP less than 110. 1 tablet 0   Multiple Vitamins-Minerals (MULTIVITAMIN ADULT PO) Take by mouth daily.     Multiple Vitamins/Iron TABS Take by mouth.     pantoprazole (PROTONIX) 40 MG tablet Take by mouth.     Semaglutide,0.25 or 0.5MG /DOS, (OZEMPIC, 0.25 OR 0.5 MG/DOSE,) 2 MG/1.5ML SOPN Inject 0.5 mg into the skin once a week.     venlafaxine XR (EFFEXOR-XR) 75 MG 24 hr capsule Take 75 mg by mouth daily.     vitamin B-12 (CYANOCOBALAMIN) 500 MCG tablet Take 1,000 mcg by mouth 2 (two) times daily.     anastrozole (ARIMIDEX) 1 MG tablet Take 1 tablet (1 mg total) by mouth daily. (Patient not taking: Reported on 06/22/2023) 45 tablet 3   anastrozole (ARIMIDEX) 1 MG tablet Take 1 tablet (1 mg total) by mouth daily. (Patient not taking: Reported on 06/22/2023) 45 tablet 3   Aspirin-Calcium Carbonate (BAYER WOMENS) 81-777 MG TABS Take by mouth. (Patient not taking: Reported on 06/22/2023)     Pediatric Multivitamins-Iron Kirke Corin W/IRON PO) Take 2 tablets by mouth daily. (Patient not taking: Reported on 06/22/2023)     No current facility-administered medications for this visit.    Allergies  Allergen Reactions   Oxycodone-Acetaminophen Other (See Comments), Itching, Rash and Hives    Other reaction(s): Hallucinations   Propoxyphene Other (See Comments), Hives, Itching and Rash    Other reaction(s): Delusions (intolerance)   Demerol [Meperidine] Other (See Comments)    hallucinations   Morphine And Codeine Hives   Oxycodone-Aspirin Other (See Comments) and Hives   Chlorhexidine Rash    Social History   Socioeconomic History   Marital status: Widowed    Spouse name: Not on file   Number of children: 3   Years of education: Not on file   Highest education level: Not on file  Occupational History   Occupation: Retired-Daycare  Tobacco Use   Smoking status: Never   Smokeless tobacco: Never  Vaping Use   Vaping status: Never Used  Substance and Sexual Activity   Alcohol use:  Yes    Comment: social   Drug use: No   Sexual activity: Not Currently    Birth control/protection: Surgical  Other Topics Concern   Not on file  Social History Narrative   Lives at home with children. Married for 48 years.      Retired.   Social Drivers of Corporate investment banker Strain: Not on file  Food Insecurity: Not on file  Transportation Needs: Not on file  Physical Activity: Not on file  Stress: Not on file  Social Connections: Unknown (07/29/2021)   Received from [facility], [facility]   Social Network    Social Network: Not on file  Intimate Partner Violence:  Unknown (06/20/2021)   Received from [facility], [facility]   HITS    Physically Hurt: Not on file    Insult or Talk Down To: Not on file    Threaten Physical Harm: Not on file    Scream or Curse: Not on file    Family History  Problem Relation Age of Onset   Heart disease Mother        "enlarged heart and leaking valves"   COPD Mother    Diabetes Mother    Breast cancer Mother 62   Cancer Mother        cancer involving the colon, esophagus, and liver   Peripheral vascular disease Father 57   Heart disease Father    Polycystic kidney disease Sister    Polycystic kidney disease Brother    Breast cancer Maternal Grandmother 47   Diabetes Maternal Grandfather    Hypertension Daughter    Breast cancer Other 24       maternal grandmother's sister   Brain cancer Other 61   Lung cancer Other     Review of Systems:  As stated in the HPI and otherwise negative.   BP 132/68   Pulse 89   Ht 4\' 11"  (1.499 m)   Wt 91.4 kg   SpO2 95%   BMI 40.68 kg/m   Physical Examination: General: Well developed, well nourished, NAD  HEENT: OP clear, mucus membranes moist  SKIN: warm, dry. No rashes. Neuro: No focal deficits  Musculoskeletal: Muscle strength 5/5 all ext  Psychiatric: Mood and affect normal  Neck: No JVD, no carotid bruits, no thyromegaly, no lymphadenopathy.  Lungs:Clear  bilaterally, no wheezes, rhonci, crackles Cardiovascular: Regular rate and rhythm. No murmurs, gallops or rubs. Abdomen:Soft. Bowel sounds present. Non-tender.  Extremities: No lower extremity edema. Pulses are 2 + in the bilateral DP/PT.  EKG:  EKG is ordered today. The ekg ordered today demonstrates  EKG Interpretation Date/Time:  Monday June 22 2023 15:31:21 EDT Ventricular Rate:  87 PR Interval:  188 QRS Duration:  84 QT Interval:  372 QTC Calculation: 447 R Axis:   -39  Text Interpretation: Normal sinus rhythm Left axis deviation Moderate voltage criteria for LVH, may be normal variant ( R in aVL , Cornell product ) Confirmed by Verne Carrow [phone]) on 06/22/2023 3:39:18 PM    Recent Labs: No results found for requested labs within last 365 days.   Lipid Panel    Component Value Date/Time   CHOL  01/30/2010 0305    184        ATP III CLASSIFICATION:  <200     mg/dL   Desirable  469-629  mg/dL   Borderline High  >=528    mg/dL   High          TRIG 413 (H) 01/30/2010 0305   HDL 44 01/30/2010 0305   CHOLHDL 4.2 01/30/2010 0305   VLDL 62 (H) 01/30/2010 0305   LDLCALC  01/30/2010 0305    78        Total Cholesterol/HDL:CHD Risk Coronary Heart Disease Risk Table                     Men   Women  1/2 Average Risk   3.4   3.3  Average Risk       5.0   4.4  2 X Average Risk   9.6   7.1  3 X Average Risk  23.4   11.0  Use the calculated Patient Ratio above and the CHD Risk Table to determine the patient's CHD Risk.        ATP III CLASSIFICATION (LDL):  <100     mg/dL   Optimal  409-811  mg/dL   Near or Above                    Optimal  130-159  mg/dL   Borderline  914-782  mg/dL   High  >956     mg/dL   Very High     Wt Readings from Last 3 Encounters:  06/22/23 91.4 kg  05/05/23 90.9 kg  07/02/22 91.6 kg    Assessment and Plan:   1. CAD with angina: She has chest pain which is mostly at rest but with exertional component at times. Will  arrange a coronary CTA to exclude obstructive CAD. Echo to assess LV function.   2. Syncope: Will assess LVEF with echo. 14 day cardiac monitor  Labs/ tests ordered today include:   Orders Placed This Encounter  Procedures   CT CORONARY MORPH W/CTA COR W/SCORE W/CA W/CM &/OR WO/CM   Basic metabolic panel with GFR   LONG TERM MONITOR (3-14 DAYS)   EKG 12-Lead   ECHOCARDIOGRAM COMPLETE   Disposition:   F/U with me in 6 weeks  Signed, Verne Carrow, MD, [facility] 06/22/2023 4:43 PM    [facility] [address] [place], [place], [place]  [address] Phone: [phone]; Fax: [phone]

## 2023-06-22 NOTE — Patient Instructions (Addendum)
 Medication Instructions:  Your physician has recommended you make the following change in your medication:   START Metoprolol Tartrate (Lopressor) 100 mg ONCE 2 hr prior to scan.   *If you need a refill on your cardiac medications before your next appointment, please call your pharmacy*  Lab Work: To be completed today: BMP  Testing/Procedures: Your physician has requested that you have an echocardiogram. Echocardiography is a painless test that uses sound waves to create images of your heart. It provides your doctor with information about the size and shape of your heart and how well your heart's chambers and valves are working. This procedure takes approximately one hour. There are no restrictions for this procedure. Please do NOT wear cologne, perfume, aftershave, or lotions (deodorant is allowed). Please arrive 15 minutes prior to your appointment time.  Please note: We ask at that you not bring children with you during ultrasound (echo/ vascular) testing. Due to room size and safety concerns, children are not allowed in the ultrasound rooms during exams. Our front office staff cannot provide observation of children in our lobby area while testing is being conducted. An adult accompanying a patient to their appointment will only be allowed in the ultrasound room at the discretion of the ultrasound technician under special circumstances. We apologize for any inconvenience.  Cardiac CT Angiogram - see instructions below  Your physician has requested that you wear a Zio heart monitor for 14 days. This will be mailed to your home with instructions on how to apply the monitor and how to return it when finished. Please allow 2 weeks after returning the heart monitor before our office calls you with the results.   Follow-Up: At [facility], you and your health needs are our priority.  As part of our continuing mission to provide you with exceptional heart care, our providers are all part  of one team.  This team includes your primary Cardiologist (physician) and Advanced Practice Providers or APPs (Physician Assistants and Nurse Practitioners) who all work together to provide you with the care you need, when you need it.  Your next appointment:   4-6 weeks - see below  Provider:   Verne Carrow, MD    We recommend signing up for the patient portal called "MyChart".  Sign up information is provided on this After Visit Summary.  MyChart is used to connect with patients for Virtual Visits (Telemedicine).  Patients are able to view lab/test results, encounter notes, upcoming appointments, etc.  Non-urgent messages can be sent to your provider as well.   To learn more about what you can do with MyChart, go to [email or web address].   Other Instructions   Your cardiac CT will be scheduled at one of the below locations:   [facility] [address] [place], [place] [address] [phone]   If scheduled at [facility], please arrive at the [facility] and Children's Entrance (Entrance C2) of [facility] 30 minutes prior to test start time. You can use the FREE valet parking offered at entrance C (encouraged to control the heart rate for the test)  Proceed to the [facility] Radiology Department (first floor) to check-in and test prep.  All radiology patients and guests should use entrance C2 at [facility], accessed from [facility], even though the hospital's physical address listed is [address].     Please follow these instructions carefully (unless otherwise directed):  An IV will be required for this test  and Nitroglycerin will be given.    On the Night Before the Test: Be sure to Drink plenty of water. Do not consume any caffeinated/decaffeinated beverages or chocolate 12 hours prior to your test. Do not take any antihistamines 12 hours prior to your test.  On the Day of the Test: Drink plenty of  water until 1 hour prior to the test. Do not eat any food 1 hour prior to test. You may take your regular medications prior to the test.  Take metoprolol (Lopressor) two hours prior to test. If you take Furosemide/Hydrochlorothiazide/Spironolactone, please HOLD on the morning of the test. FEMALES- please wear underwire-free bra if available, avoid dresses & tight clothing      After the Test: Drink plenty of water. After receiving IV contrast, you may experience a mild flushed feeling. This is normal. On occasion, you may experience a mild rash up to 24 hours after the test. This is not dangerous. If this occurs, you can take Benadryl 25 mg and increase your fluid intake. If you experience trouble breathing, this can be serious. If it is severe call 911 IMMEDIATELY. If it is mild, please call our office. If you take any of these medications: Glipizide/Metformin, Avandament, Glucavance, please do not take 48 hours after completing test unless otherwise instructed.  We will call to schedule your test 2-4 weeks out understanding that some insurance companies will need an authorization prior to the service being performed.   For more information and frequently asked questions, please visit our website : [email or web address]  For non-scheduling related questions, please contact the cardiac imaging nurse navigator should you have any questions/concerns: Cardiac Imaging Nurse Navigators Direct Office Dial: [phone]   For scheduling needs, including cancellations and rescheduling, please call [place], [phone].  -----------------------------------------------------------------------  Miranda Fuller- Long Term Monitor Instructions     Your physician has requested you wear a ZIO patch monitor for 14 days.  This is a single patch monitor. [employer] supplies one patch monitor per enrollment. Additional  stickers are not available. Please do not apply patch if you will be having a  Nuclear Stress Test,  Echocardiogram, Cardiac CT, MRI, or Chest Xray during the period you would be wearing the  monitor. The patch cannot be worn during these tests. You cannot remove and re-apply the  ZIO XT patch monitor.  Your ZIO patch monitor will be mailed 3 day USPS to your address on file. It may take 3-5 days  to receive your monitor after you have been enrolled.  Once you have received your monitor, please review the enclosed instructions. Your monitor  has already been registered assigning a specific monitor serial # to you.     Billing and Patient Assistance Program Information     We have supplied [employer] with any of your insurance information on file for billing purposes.  [employer] offers a sliding scale Patient Assistance Program for patients that do not have  insurance, or whose insurance does not completely cover the cost of the ZIO monitor.  You must apply for the Patient Assistance Program to qualify for this discounted rate.  To apply, please call [employer] at [phone], select option 4, select option 2, ask to apply for  Patient Assistance Program. Meredeth Ide will ask your household income, and how many people  are in your household. They will quote your out-of-pocket cost based on that information.  [employer] will also be able to set up a 74-month, interest-free payment plan if needed.     Applying  the monitor     Shave hair from upper left chest.  Hold abrader disc by orange tab. Rub abrader in 40 strokes over the upper left chest as  indicated in your monitor instructions.  Clean area with 4 enclosed alcohol pads. Let dry.  Apply patch as indicated in monitor instructions. Patch will be placed under collarbone on left  side of chest with arrow pointing upward.  Rub patch adhesive wings for 2 minutes. Remove white label marked "1". Remove the white  label marked "2". Rub patch adhesive wings for 2 additional minutes.  While looking in a mirror, press and release  button in center of patch. A small green light will  flash 3-4 times. This will be your only indicator that the monitor has been turned on.  Do not shower for the first 24 hours. You may shower after the first 24 hours.  Press the button if you feel a symptom. You will hear a small click. Record Date, Time and  Symptom in the Patient Logbook.  When you are ready to remove the patch, follow instructions on the last 2 pages of Patient  Logbook. Stick patch monitor onto the last page of Patient Logbook.  Place Patient Logbook in the blue and white box. Use locking tab on box and tape box closed  securely. The blue and white box has prepaid postage on it. Please place it in the mailbox as  soon as possible. Your physician should have your test results approximately 7 days after the  monitor has been mailed back to [facility].  Call [facility] Customer Care at [phone] if you have questions regarding  your ZIO XT patch monitor. Call them immediately if you see an orange light blinking on your  monitor.  If your monitor falls off in less than 4 days, contact our Monitor department at [phone].  If your monitor becomes loose or falls off after 4 days call [employer] at [phone] for  suggestions on securing your monitor.        1st Floor: - Lobby - Registration  - Pharmacy  - Lab - Cafe  2nd Floor: - PV Lab - Diagnostic Testing (echo, CT, nuclear med)  3rd Floor: - Vacant  4th Floor: - TCTS (cardiothoracic surgery) - AFib Clinic - Structural Heart Clinic - Vascular Surgery  - Vascular Ultrasound  5th Floor: - HeartCare Cardiology (general and EP) - Clinical Pharmacy for coumadin, hypertension, lipid, weight-loss medications, and med management appointments    Valet parking services will be available as well.

## 2023-06-24 DIAGNOSIS — Z13220 Encounter for screening for lipoid disorders: Secondary | ICD-10-CM | POA: Diagnosis not present

## 2023-06-24 DIAGNOSIS — R300 Dysuria: Secondary | ICD-10-CM | POA: Diagnosis not present

## 2023-06-24 DIAGNOSIS — R5383 Other fatigue: Secondary | ICD-10-CM | POA: Diagnosis not present

## 2023-06-24 DIAGNOSIS — E1122 Type 2 diabetes mellitus with diabetic chronic kidney disease: Secondary | ICD-10-CM | POA: Diagnosis not present

## 2023-06-30 DIAGNOSIS — E782 Mixed hyperlipidemia: Secondary | ICD-10-CM | POA: Diagnosis not present

## 2023-06-30 DIAGNOSIS — R5381 Other malaise: Secondary | ICD-10-CM | POA: Diagnosis not present

## 2023-07-03 ENCOUNTER — Telehealth (HOSPITAL_COMMUNITY): Payer: Self-pay | Admitting: *Deleted

## 2023-07-03 NOTE — Telephone Encounter (Signed)
Attempted to call patient regarding upcoming cardiac CT appointment. Left message on voicemail with name and callback number  Erabella Kuipers RN Navigator Cardiac Imaging [facility] [phone] Office [phone] Cell  Reminder to obtain labs prior to appt. 

## 2023-07-07 ENCOUNTER — Telehealth (HOSPITAL_COMMUNITY): Payer: Self-pay | Admitting: *Deleted

## 2023-07-07 ENCOUNTER — Ambulatory Visit (HOSPITAL_COMMUNITY): Admission: RE | Admit: 2023-07-07 | Source: Ambulatory Visit

## 2023-07-07 DIAGNOSIS — Z1389 Encounter for screening for other disorder: Secondary | ICD-10-CM | POA: Diagnosis not present

## 2023-07-07 DIAGNOSIS — Z0000 Encounter for general adult medical examination without abnormal findings: Secondary | ICD-10-CM | POA: Diagnosis not present

## 2023-07-07 DIAGNOSIS — Z0001 Encounter for general adult medical examination with abnormal findings: Secondary | ICD-10-CM | POA: Diagnosis not present

## 2023-07-07 NOTE — Telephone Encounter (Signed)
 Patient calling about her missed appointment. She states she was out of town and her car broke down. New appt made for April 29 at 8 am.  Chase Copping RN Navigator Cardiac Imaging [facility] [phone] office [phone] cell

## 2023-07-13 ENCOUNTER — Telehealth (HOSPITAL_COMMUNITY): Payer: Self-pay | Admitting: *Deleted

## 2023-07-13 NOTE — Telephone Encounter (Signed)
 Attempted to call patient regarding upcoming cardiac CT appointment. Left message on voicemail with name and callback number Kerri Peed RN Navigator Cardiac Imaging [facility] [phone] Office  Reminder to obtain lab work prior to appt.

## 2023-07-13 NOTE — Telephone Encounter (Signed)
 Received call from patient regarding upcoming cardiac imaging study; pt verbalizes understanding of appt date/time, parking situation and where to check in, pre-test NPO status and medications ordered, and verified current allergies; name and call back number provided for further questions should they arise Johney Frame RN Navigator Cardiac Imaging [facility] [phone] office [phone] cell

## 2023-07-14 ENCOUNTER — Ambulatory Visit (HOSPITAL_BASED_OUTPATIENT_CLINIC_OR_DEPARTMENT_OTHER)
Admission: RE | Admit: 2023-07-14 | Discharge: 2023-07-14 | Disposition: A | Discharge status: Home / Self Care | Payer: Self-pay | Source: Ambulatory Visit | Attending: Cardiology | Admitting: Cardiology

## 2023-07-14 ENCOUNTER — Ambulatory Visit (HOSPITAL_COMMUNITY)
Admission: RE | Admit: 2023-07-14 | Discharge: 2023-07-14 | Disposition: A | Discharge status: Home / Self Care | Source: Ambulatory Visit | Attending: Internal Medicine | Admitting: Internal Medicine

## 2023-07-14 ENCOUNTER — Other Ambulatory Visit: Payer: Self-pay | Admitting: Cardiology

## 2023-07-14 DIAGNOSIS — I2510 Atherosclerotic heart disease of native coronary artery without angina pectoris: Secondary | ICD-10-CM | POA: Diagnosis not present

## 2023-07-14 DIAGNOSIS — R079 Chest pain, unspecified: Secondary | ICD-10-CM | POA: Insufficient documentation

## 2023-07-14 DIAGNOSIS — I25118 Atherosclerotic heart disease of native coronary artery with other forms of angina pectoris: Secondary | ICD-10-CM | POA: Diagnosis not present

## 2023-07-14 DIAGNOSIS — R931 Abnormal findings on diagnostic imaging of heart and coronary circulation: Secondary | ICD-10-CM | POA: Diagnosis not present

## 2023-07-14 DIAGNOSIS — I700 Atherosclerosis of aorta: Secondary | ICD-10-CM | POA: Insufficient documentation

## 2023-07-14 MED ORDER — DILTIAZEM HCL 25 MG/5ML IV SOLN: 10.0000 mg | INTRAVENOUS | Status: DC | PRN

## 2023-07-14 MED ORDER — IOHEXOL 350 MG/ML SOLN
100.0000 mL | Freq: Once | INTRAVENOUS | Status: AC | PRN
  Administered 2023-07-14: 100 mL via INTRAVENOUS

## 2023-07-14 MED ORDER — NITROGLYCERIN 0.4 MG SL SUBL: 0.8000 mg | SUBLINGUAL_TABLET | Freq: Once | SUBLINGUAL | Status: DC

## 2023-07-14 MED ORDER — NITROGLYCERIN 0.4 MG SL SUBL
SUBLINGUAL_TABLET | SUBLINGUAL | Status: AC
  Filled 2023-07-14: qty 2

## 2023-07-14 MED ORDER — METOPROLOL TARTRATE 5 MG/5ML IV SOLN: 10.0000 mg | Freq: Once | INTRAVENOUS | Status: DC | PRN

## 2023-07-15 DIAGNOSIS — R55 Syncope and collapse: Secondary | ICD-10-CM | POA: Diagnosis not present

## 2023-07-15 DIAGNOSIS — E1122 Type 2 diabetes mellitus with diabetic chronic kidney disease: Secondary | ICD-10-CM | POA: Diagnosis not present

## 2023-07-15 DIAGNOSIS — E782 Mixed hyperlipidemia: Secondary | ICD-10-CM | POA: Diagnosis not present

## 2023-07-16 ENCOUNTER — Other Ambulatory Visit: Payer: Self-pay

## 2023-07-16 DIAGNOSIS — R55 Syncope and collapse: Secondary | ICD-10-CM | POA: Diagnosis not present

## 2023-07-16 MED ORDER — METOPROLOL SUCCINATE ER 25 MG PO TB24: 25.0000 mg | ORAL_TABLET | Freq: Every day | ORAL | 2 refills | Status: AC

## 2023-07-28 ENCOUNTER — Ambulatory Visit (HOSPITAL_COMMUNITY)

## 2023-07-30 ENCOUNTER — Ambulatory Visit: Admitting: Cardiovascular Disease

## 2023-08-03 ENCOUNTER — Ambulatory Visit: Attending: Cardiovascular Disease | Admitting: Cardiovascular Disease

## 2023-08-03 ENCOUNTER — Encounter: Payer: Self-pay | Admitting: Cardiovascular Disease

## 2023-08-03 VITALS — BP 128/70 | HR 98 | Ht 59.0 in | Wt 197.4 lb

## 2023-08-03 DIAGNOSIS — I25110 Atherosclerotic heart disease of native coronary artery with unstable angina pectoris: Secondary | ICD-10-CM

## 2023-08-03 NOTE — H&P (View-Only) (Signed)
 Chief Complaint  Patient presents with   Follow-up    CAD    History of Present Illness: 73 yo female with history of breast cancer, DM, HTN, obesity, sleep apnea and mild dementia who is here today for follow up. I saw her as a new patient in 2020 for the evaluation of chest pain. She had a cardiac cath in 2011 which showed mild luminal irregularities but no obstructive CAD. Exercise stress test in 2016 with no ischemic EKG changes. Echo December 2019 with LVEF=60-65%, sclerotic aortic valve without stenosis. Coronary CTA in March 2020 with mild to moderate non-obstructive disease in the LAD and Circumflex with probable flow limiting disease in the small caliber obtuse marginal branch (small caliber vessel). I saw her in April 2025 and she c/o left sided chest pain with exertion or at rest associated with dyspnea in addition to a syncopal episode in November 2024 and near syncope in March 2025. Coronary CTA April 2025 with moderate LAD stenosis and severe stenosis in the small caliber OM branch. Cardiac monitor May 2025 with sinus with SVT and VT. Toprol  was started.   She is here today for follow up. She continues to have left sided chest pain 2-3 days per week, with exertion, lasting 5-10 minutes and resolved with rest. She has some dizziness. No dyspnea, palpitations, lower extremity edema, orthopnea, PND.    Primary Care Physician: Orest Bio, MD   Past Medical History:  Diagnosis Date   Breast mass 12/27/2018   right- cancer, s/p lumpectomy, radiation   Coccidioidomycosis, pulmonary (HCC)    Complication of anesthesia    DDD (degenerative disc disease), cervical    Dementia (HCC)    Early per Dr. Zoila Hines   Depression 2009   DM (diabetes mellitus screen) 2009   Family history of bone cancer    Family history of brain cancer    Family history of breast cancer    Family history of esophageal cancer    Family history of lung cancer    Glaucoma    HTN (hypertension)    Morbid  obesity (HCC)    Nodule of upper lobe of left lung 12/27/2018   Personal history of radiation therapy    PONV (postoperative nausea and vomiting)    Sleep apnea    had surgery   Type II diabetes mellitus, uncontrolled 01/31/2019    Past Surgical History:  Procedure Laterality Date   ABDOMINAL HYSTERECTOMY     TAH BSO   BREAST BIOPSY Right 02/07/2019   BREAST LUMPECTOMY Right 04/28/2019   BREAST LUMPECTOMY WITH RADIOACTIVE SEED AND SENTINEL LYMPH NODE BIOPSY Left 03/22/2021   Procedure: LEFT BREAST LUMPECTOMY WITH RADIOACTIVE SEED AND SENTINEL LYMPH NODE BIOPSY;  Surgeon: Caralyn Chandler, MD;  Location: [facility];  Service: General;  Laterality: Left;   BREAST LUMPECTOMY WITH RADIOACTIVE SEED LOCALIZATION Right 04/28/2019   Procedure: RIGHT BREAST LUMPECTOMY WITH RADIOACTIVE SEED LOCALIZATION;  Surgeon: Caralyn Chandler, MD;  Location: [facility];  Service: General;  Laterality: Right;   CHOLECYSTECTOMY     LAPAROSCOPIC GASTRIC SLEEVE RESECTION     TUBAL LIGATION     UVULOPALATOPHARYNGOPLASTY      Current Outpatient Medications  Medication Sig Dispense Refill   anastrozole  (ARIMIDEX ) 1 MG tablet Take 1 tablet (1 mg total) by mouth daily. 45 tablet 3   aspirin 81 MG tablet Take 1 tablet by mouth daily.     Aspirin-Calcium Carbonate (BAYER WOMENS) 81-777 MG TABS Take  by mouth.     calcium carbonate (OS-CAL) 1250 (500 Ca) MG chewable tablet Chew by mouth.     Calcium Carbonate-Vit D-Min (CALCIUM 1200 PO) Take 1,200 mg by mouth 2 (two) times daily.     Cholecalciferol (VITAMIN D -3) 25 MCG (1000 UT) CAPS Take 3 capsules by mouth daily.     ezetimibe (ZETIA) 10 MG tablet Take 10 mg by mouth daily.     insulin  aspart (NOVOLOG ) 100 UNIT/ML injection Inject 5-15 Units into the skin 3 (three) times daily with meals. 5-15 units on sliding scale      insulin  glargine (LANTUS) 100 UNIT/ML injection Inject 50 Units into the skin at bedtime.      lisinopril-hydrochlorothiazide (ZESTORETIC) 10-12.5 MG tablet Take 1 tablet by mouth daily.     MAGNESIUM PO Take by mouth.     metoprolol  succinate (TOPROL  XL) 25 MG 24 hr tablet Take 1 tablet (25 mg total) by mouth daily. 30 tablet 2   Multiple Vitamins-Minerals (MULTIVITAMIN ADULT PO) Take by mouth daily.     Multiple Vitamins/Iron TABS Take by mouth.     pantoprazole  (PROTONIX ) 40 MG tablet Take by mouth.     Pediatric Multivitamins-Iron Danielle [place] W/IRON PO) Take 2 tablets by mouth daily.     Semaglutide ,0.25 or 0.5MG /DOS, (OZEMPIC , 0.25 OR 0.5 MG/DOSE,) 2 MG/1.5ML SOPN Inject 0.5 mg into the skin once a week.     venlafaxine XR (EFFEXOR-XR) 150 MG 24 hr capsule Take 150 mg by mouth daily.     vitamin B-12 (CYANOCOBALAMIN ) 500 MCG tablet Take 1,000 mcg by mouth 2 (two) times daily.     anastrozole  (ARIMIDEX ) 1 MG tablet Take 1 tablet (1 mg total) by mouth daily. (Patient not taking: Reported on 06/22/2023) 45 tablet 3   metoprolol  tartrate (LOPRESSOR ) 100 MG tablet Take 1 tablet (100 mg total) by mouth once for 1 dose. Take 90-120 minutes prior to scan. Hold for SBP less than 110. 1 tablet 0   No current facility-administered medications for this visit.    Allergies  Allergen Reactions   Oxycodone-Acetaminophen  Other (See Comments), Itching, Rash and Hives    Other reaction(s): Hallucinations   Propoxyphene Other (See Comments), Hives, Itching and Rash    Other reaction(s): Delusions (intolerance)   Demerol [Meperidine] Other (See Comments)    hallucinations   Morphine And Codeine Hives   Oxycodone-Aspirin Other (See Comments) and Hives   Chlorhexidine  Rash    Social History   Socioeconomic History   Marital status: Widowed    Spouse name: Not on file   Number of children: 3   Years of education: Not on file   Highest education level: Not on file  Occupational History   Occupation: Retired-Daycare  Tobacco Use   Smoking status: Never   Smokeless tobacco: Never  Vaping  Use   Vaping status: Never Used  Substance and Sexual Activity   Alcohol use: Yes    Comment: social   Drug use: No   Sexual activity: Not Currently    Birth control/protection: Surgical  Other Topics Concern   Not on file  Social History Narrative   Lives at home with children. Married for 48 years.      Retired.   Social Drivers of Corporate investment banker Strain: Not on file  Food Insecurity: Not on file  Transportation Needs: Not on file  Physical Activity: Not on file  Stress: Not on file  Social Connections: Unknown (07/29/2021)   [facility]   Social Network    Social Network: Not on file  Intimate Partner Violence: Unknown (06/20/2021)   [facility] from [facility]   HITS    Physically Hurt: Not on file    Insult or Talk Down To: Not on file    Threaten Physical Harm: Not on file    Scream or Curse: Not on file    Family History  Problem Relation Age of Onset   Heart disease Mother        "enlarged heart and leaking valves"   COPD Mother    Diabetes Mother    Breast cancer Mother 66   Cancer Mother        cancer involving the colon, esophagus, and liver   Peripheral vascular disease Father 30   Heart disease Father    Polycystic kidney disease Sister    Polycystic kidney disease Brother    Breast cancer Maternal Grandmother 68   Diabetes Maternal Grandfather    Hypertension Daughter    Breast cancer Other 67       maternal grandmother's sister   Brain cancer Other 71   Lung cancer Other     Review of Systems:  As stated in the HPI and otherwise negative.   BP 128/70   Pulse 98   Ht 4\' 11"  (1.499 m)   Wt 197 lb 6.4 oz (89.5 kg)   SpO2 96%   BMI 39.87 kg/m   Physical Examination: General: Well developed, well nourished, NAD  HEENT: OP clear, mucus membranes moist  SKIN: warm, dry. No rashes. Neuro: No focal deficits  Musculoskeletal: Muscle strength 5/5 all ext  Psychiatric: Mood and affect  normal  Neck: No JVD, no carotid bruits, no thyromegaly, no lymphadenopathy.  Lungs:Clear bilaterally, no wheezes, rhonci, crackles Cardiovascular: Regular rate and rhythm. No murmurs, gallops or rubs. Abdomen:Soft. Bowel sounds present. Non-tender.  Extremities: No lower extremity edema. Pulses are 2 + in the bilateral DP/PT.  EKG:  EKG is ordered today. The ekg ordered today demonstrates EKG Interpretation Date/Time:  Monday Aug 03 2023 14:37:41 EDT Ventricular Rate:  98 PR Interval:  164 QRS Duration:  86 QT Interval:  360 QTC Calculation: 459 R Axis:   -36  Text Interpretation: Normal sinus rhythm Left axis deviation Left ventricular hypertrophy with repolarization abnormality Confirmed by Antoinette Batman [phone]) on 08/03/2023 2:50:26 PM    Recent Labs: No results found for requested labs within last 365 days.   Lipids followed in primary care  Wt Readings from Last 3 Encounters:  08/03/23 197 lb 6.4 oz (89.5 kg)  06/22/23 201 lb 6.4 oz (91.4 kg)  05/05/23 200 lb 4.8 oz (90.9 kg)    Assessment and Plan:   1. CAD with unstable angina: Coronary CTA with possible severe disease. Cardiac cath is indicated.  I have reviewed the risks, indications, and alternatives to cardiac catheterization, possible angioplasty, and stenting with the patient. Risks include but are not limited to bleeding, infection, vascular injury, stroke, myocardial infection, arrhythmia, kidney injury, radiation-related injury in the case of prolonged fluoroscopy use, emergency cardiac surgery, and death. The patient understands the risks of serious complication is 1-2 in 1000 with diagnostic cardiac cath and 1-2% or less with angioplasty/stenting. Will plan cardiac cath at [facility] on 08/19/23 at 11:30 am.  Continue ASA, metoprolol  and Zetia.   2. Syncope: Her syncope is not felt to be cardiac related. Cardiac monitor with sinus with short runs of SVT and VT. Continue Toprol   Labs/  tests ordered today  include:   Orders Placed This Encounter  Procedures   EKG 12-Lead   Disposition:   F/U with me after cath  Signed, Antoinette Batman, MD, [facility] 08/03/2023 4:09 PM    [facility] [address] [place], [place], [place]  [address] Phone: [phone]; Fax: [phone]

## 2023-08-03 NOTE — Patient Instructions (Signed)
 Medication Instructions:  No changes *If you need a refill on your cardiac medications before your next appointment, please call your pharmacy*  Lab Work: Today: cbc, bmet   Testing/Procedures: none  Follow-Up: At [facility], you and your health needs are our priority.  As part of our continuing mission to provide you with exceptional heart care, our providers are all part of one team.  This team includes your primary Cardiologist (physician) and Advanced Practice Providers or APPs (Physician Assistants and Nurse Practitioners) who all work together to provide you with the care you need, when you need it.  Your next appointment:   3 weeks with Dr. Abel Hoe or an Advanced Practice Provider (NP or PA-C)  We recommend signing up for the patient portal called "MyChart".  Sign up information is provided on this After Visit Summary.  MyChart is used to connect with patients for Virtual Visits (Telemedicine).  Patients are able to view lab/test results, encounter notes, upcoming appointments, etc.  Non-urgent messages can be sent to your provider as well.   To learn more about what you can do with MyChart, go to [email or web address].   Other Instructions       Cardiac/Peripheral Catheterization   You are scheduled for a Cardiac Catheterization on Wednesday, June 4 with Dr. Antoinette Batman.  1. Please arrive at the [facility] (Main Entrance A) at [facility]: [address] [place], [place] [address] at 9:30 AM (This time is TWO hour(s) before your procedure to ensure your preparation).   Free valet parking service is available. You will check in at ADMITTING. The support person will be asked to wait in the waiting room.  It is OK to have someone drop you off and come back when you are ready to be discharged.        Special note: Every effort is made to have your procedure done on time. Please understand that emergencies sometimes delay scheduled  procedures.  2. Diet: Do not eat solid foods after midnight.  You may have clear liquids until 5 AM the day of the procedure.  3. Labs: You will need to have blood drawn today.   You do not need to be fasting.  4. Medication instructions in preparation for your procedure:   Contrast Allergy: No   No lisinopril-hydrochlorothiazide on day of cath   Take only 25 units of insulin  the night before your procedure. Do not take any insulin  on the day of the procedure.   On the morning of your procedure, take Aspirin 81 mg and any morning medicines NOT listed above.  You may use sips of water.  5. Plan to go home the same day, you will only stay overnight if medically necessary. 6. You MUST have a responsible adult to drive you home. 7. An adult MUST be with you the first 24 hours after you arrive home. 8. Bring a current list of your medications, and the last time and date medication taken. 9. Bring ID and current insurance cards. 10.Please wear clothes that are easy to get on and off and wear slip-on shoes.  Thank you for allowing us  to care for you!   -- [facility]

## 2023-08-03 NOTE — Progress Notes (Signed)
 Chief Complaint  Patient presents with   Follow-up    CAD    History of Present Illness: 73 yo female with history of breast cancer, DM, HTN, obesity, sleep apnea and mild dementia who is here today for follow up. I saw her as a new patient in 2020 for the evaluation of chest pain. She had a cardiac cath in 2011 which showed mild luminal irregularities but no obstructive CAD. Exercise stress test in 2016 with no ischemic EKG changes. Echo December 2019 with LVEF=60-65%, sclerotic aortic valve without stenosis. Coronary CTA in March 2020 with mild to moderate non-obstructive disease in the LAD and Circumflex with probable flow limiting disease in the small caliber obtuse marginal branch (small caliber vessel). I saw her in April 2025 and she c/o left sided chest pain with exertion or at rest associated with dyspnea in addition to a syncopal episode in November 2024 and near syncope in March 2025. Coronary CTA April 2025 with moderate LAD stenosis and severe stenosis in the small caliber OM branch. Cardiac monitor May 2025 with sinus with SVT and VT. Toprol  was started.   She is here today for follow up. She continues to have left sided chest pain 2-3 days per week, with exertion, lasting 5-10 minutes and resolved with rest. She has some dizziness. No dyspnea, palpitations, lower extremity edema, orthopnea, PND.    Primary Care Physician: Orest Bio, MD   Past Medical History:  Diagnosis Date   Breast mass 12/27/2018   right- cancer, s/p lumpectomy, radiation   Coccidioidomycosis, pulmonary (HCC)    Complication of anesthesia    DDD (degenerative disc disease), cervical    Dementia (HCC)    Early per Dr. Zoila Hines   Depression 2009   DM (diabetes mellitus screen) 2009   Family history of bone cancer    Family history of brain cancer    Family history of breast cancer    Family history of esophageal cancer    Family history of lung cancer    Glaucoma    HTN (hypertension)    Morbid  obesity (HCC)    Nodule of upper lobe of left lung 12/27/2018   Personal history of radiation therapy    PONV (postoperative nausea and vomiting)    Sleep apnea    had surgery   Type II diabetes mellitus, uncontrolled 01/31/2019    Past Surgical History:  Procedure Laterality Date   ABDOMINAL HYSTERECTOMY     TAH BSO   BREAST BIOPSY Right 02/07/2019   BREAST LUMPECTOMY Right 04/28/2019   BREAST LUMPECTOMY WITH RADIOACTIVE SEED AND SENTINEL LYMPH NODE BIOPSY Left 03/22/2021   Procedure: LEFT BREAST LUMPECTOMY WITH RADIOACTIVE SEED AND SENTINEL LYMPH NODE BIOPSY;  Surgeon: Caralyn Chandler, MD;  Location: [facility];  Service: General;  Laterality: Left;   BREAST LUMPECTOMY WITH RADIOACTIVE SEED LOCALIZATION Right 04/28/2019   Procedure: RIGHT BREAST LUMPECTOMY WITH RADIOACTIVE SEED LOCALIZATION;  Surgeon: Caralyn Chandler, MD;  Location: [facility];  Service: General;  Laterality: Right;   CHOLECYSTECTOMY     LAPAROSCOPIC GASTRIC SLEEVE RESECTION     TUBAL LIGATION     UVULOPALATOPHARYNGOPLASTY      Current Outpatient Medications  Medication Sig Dispense Refill   anastrozole  (ARIMIDEX ) 1 MG tablet Take 1 tablet (1 mg total) by mouth daily. 45 tablet 3   aspirin 81 MG tablet Take 1 tablet by mouth daily.     Aspirin-Calcium Carbonate (BAYER WOMENS) 81-777 MG TABS Take  by mouth.     calcium carbonate (OS-CAL) 1250 (500 Ca) MG chewable tablet Chew by mouth.     Calcium Carbonate-Vit D-Min (CALCIUM 1200 PO) Take 1,200 mg by mouth 2 (two) times daily.     Cholecalciferol (VITAMIN D -3) 25 MCG (1000 UT) CAPS Take 3 capsules by mouth daily.     ezetimibe (ZETIA) 10 MG tablet Take 10 mg by mouth daily.     insulin  aspart (NOVOLOG ) 100 UNIT/ML injection Inject 5-15 Units into the skin 3 (three) times daily with meals. 5-15 units on sliding scale      insulin  glargine (LANTUS) 100 UNIT/ML injection Inject 50 Units into the skin at bedtime.      lisinopril-hydrochlorothiazide (ZESTORETIC) 10-12.5 MG tablet Take 1 tablet by mouth daily.     MAGNESIUM PO Take by mouth.     metoprolol  succinate (TOPROL  XL) 25 MG 24 hr tablet Take 1 tablet (25 mg total) by mouth daily. 30 tablet 2   Multiple Vitamins-Minerals (MULTIVITAMIN ADULT PO) Take by mouth daily.     Multiple Vitamins/Iron TABS Take by mouth.     pantoprazole  (PROTONIX ) 40 MG tablet Take by mouth.     Pediatric Multivitamins-Iron [place] W/IRON PO) Take 2 tablets by mouth daily.     Semaglutide ,0.25 or 0.5MG /DOS, (OZEMPIC , 0.25 OR 0.5 MG/DOSE,) 2 MG/1.5ML SOPN Inject 0.5 mg into the skin once a week.     venlafaxine XR (EFFEXOR-XR) 150 MG 24 hr capsule Take 150 mg by mouth daily.     vitamin B-12 (CYANOCOBALAMIN ) 500 MCG tablet Take 1,000 mcg by mouth 2 (two) times daily.     anastrozole  (ARIMIDEX ) 1 MG tablet Take 1 tablet (1 mg total) by mouth daily. (Patient not taking: Reported on 06/22/2023) 45 tablet 3   metoprolol  tartrate (LOPRESSOR ) 100 MG tablet Take 1 tablet (100 mg total) by mouth once for 1 dose. Take 90-120 minutes prior to scan. Hold for SBP less than 110. 1 tablet 0   No current facility-administered medications for this visit.    Allergies  Allergen Reactions   Oxycodone-Acetaminophen  Other (See Comments), Itching, Rash and Hives    Other reaction(s): Hallucinations   Propoxyphene Other (See Comments), Hives, Itching and Rash    Other reaction(s): Delusions (intolerance)   Demerol [Meperidine] Other (See Comments)    hallucinations   Morphine And Codeine Hives   Oxycodone-Aspirin Other (See Comments) and Hives   Chlorhexidine  Rash    Social History   Socioeconomic History   Marital status: Widowed    Spouse name: Not on file   Number of children: 3   Years of education: Not on file   Highest education level: Not on file  Occupational History   Occupation: Retired-Daycare  Tobacco Use   Smoking status: Never   Smokeless tobacco: Never  Vaping  Use   Vaping status: Never Used  Substance and Sexual Activity   Alcohol use: Yes    Comment: social   Drug use: No   Sexual activity: Not Currently    Birth control/protection: Surgical  Other Topics Concern   Not on file  Social History Narrative   Lives at home with children. Married for 48 years.      Retired.   Social Drivers of Corporate investment banker Strain: Not on file  Food Insecurity: Not on file  Transportation Needs: Not on file  Physical Activity: Not on file  Stress: Not on file  Social Connections: Unknown (07/29/2021)   [facility]   Social Network    Social Network: Not on file  Intimate Partner Violence: Unknown (06/20/2021)   Received from [facility]   HITS    Physically Hurt: Not on file    Insult or Talk Down To: Not on file    Threaten Physical Harm: Not on file    Scream or Curse: Not on file    Family History  Problem Relation Age of Onset   Heart disease Mother        "enlarged heart and leaking valves"   COPD Mother    Diabetes Mother    Breast cancer Mother 66   Cancer Mother        cancer involving the colon, esophagus, and liver   Peripheral vascular disease Father 30   Heart disease Father    Polycystic kidney disease Sister    Polycystic kidney disease Brother    Breast cancer Maternal Grandmother 68   Diabetes Maternal Grandfather    Hypertension Daughter    Breast cancer Other 67       maternal grandmother's sister   Brain cancer Other 71   Lung cancer Other     Review of Systems:  As stated in the HPI and otherwise negative.   BP 128/70   Pulse 98   Ht 4\' 11"  (1.499 m)   Wt 197 lb 6.4 oz (89.5 kg)   SpO2 96%   BMI 39.87 kg/m   Physical Examination: General: Well developed, well nourished, NAD  HEENT: OP clear, mucus membranes moist  SKIN: warm, dry. No rashes. Neuro: No focal deficits  Musculoskeletal: Muscle strength 5/5 all ext  Psychiatric: Mood and affect  normal  Neck: No JVD, no carotid bruits, no thyromegaly, no lymphadenopathy.  Lungs:Clear bilaterally, no wheezes, rhonci, crackles Cardiovascular: Regular rate and rhythm. No murmurs, gallops or rubs. Abdomen:Soft. Bowel sounds present. Non-tender.  Extremities: No lower extremity edema. Pulses are 2 + in the bilateral DP/PT.  EKG:  EKG is ordered today. The ekg ordered today demonstrates EKG Interpretation Date/Time:  Monday Aug 03 2023 14:37:41 EDT Ventricular Rate:  98 PR Interval:  164 QRS Duration:  86 QT Interval:  360 QTC Calculation: 459 R Axis:   -36  Text Interpretation: Normal sinus rhythm Left axis deviation Left ventricular hypertrophy with repolarization abnormality Confirmed by Antoinette Batman [phone]) on 08/03/2023 2:50:26 PM    Recent Labs: No results found for requested labs within last 365 days.   Lipids followed in primary care  Wt Readings from Last 3 Encounters:  08/03/23 197 lb 6.4 oz (89.5 kg)  06/22/23 201 lb 6.4 oz (91.4 kg)  05/05/23 200 lb 4.8 oz (90.9 kg)    Assessment and Plan:   1. CAD with unstable angina: Coronary CTA with possible severe disease. Cardiac cath is indicated.  I have reviewed the risks, indications, and alternatives to cardiac catheterization, possible angioplasty, and stenting with the patient. Risks include but are not limited to bleeding, infection, vascular injury, stroke, myocardial infection, arrhythmia, kidney injury, radiation-related injury in the case of prolonged fluoroscopy use, emergency cardiac surgery, and death. The patient understands the risks of serious complication is 1-2 in 1000 with diagnostic cardiac cath and 1-2% or less with angioplasty/stenting. Will plan cardiac cath at [facility] on 08/19/23 at 11:30 am.  Continue ASA, metoprolol  and Zetia.   2. Syncope: Her syncope is not felt to be cardiac related. Cardiac monitor with sinus with short runs of SVT and VT. Continue Toprol   Labs/  tests ordered today  include:   Orders Placed This Encounter  Procedures   EKG 12-Lead   Disposition:   F/U with me after cath  Signed, Antoinette Batman, MD, [facility] 08/03/2023 4:09 PM    [facility] [address] [place], [place], [place]  [address] Phone: [phone]; Fax: [phone]

## 2023-08-13 ENCOUNTER — Other Ambulatory Visit: Payer: Self-pay | Admitting: *Deleted

## 2023-08-13 ENCOUNTER — Telehealth: Payer: Self-pay | Admitting: *Deleted

## 2023-08-13 DIAGNOSIS — Z01812 Encounter for preprocedural laboratory examination: Secondary | ICD-10-CM

## 2023-08-13 DIAGNOSIS — I25110 Atherosclerotic heart disease of native coronary artery with unstable angina pectoris: Secondary | ICD-10-CM

## 2023-08-13 DIAGNOSIS — R931 Abnormal findings on diagnostic imaging of heart and coronary circulation: Secondary | ICD-10-CM

## 2023-08-13 NOTE — Telephone Encounter (Signed)
 Cardiac cath 08/19/23-Call placed to patient to discuss pre-procedure lab-BMP/CBC. Patient tells me she did not get BMP/CBC 08/03/23-she will plan to go to [facility] [place] 08/14/23 to have this done.

## 2023-08-14 DIAGNOSIS — E1122 Type 2 diabetes mellitus with diabetic chronic kidney disease: Secondary | ICD-10-CM | POA: Diagnosis not present

## 2023-08-14 DIAGNOSIS — R931 Abnormal findings on diagnostic imaging of heart and coronary circulation: Secondary | ICD-10-CM | POA: Diagnosis not present

## 2023-08-14 DIAGNOSIS — E782 Mixed hyperlipidemia: Secondary | ICD-10-CM | POA: Diagnosis not present

## 2023-08-14 DIAGNOSIS — I25110 Atherosclerotic heart disease of native coronary artery with unstable angina pectoris: Secondary | ICD-10-CM | POA: Diagnosis not present

## 2023-08-14 LAB — CBC
Hematocrit: 43.2 % (ref 34.0–46.6)
Hemoglobin: 14.3 g/dL (ref 11.1–15.9)
MCH: 31.9 pg (ref 26.6–33.0)
MCHC: 33.1 g/dL (ref 31.5–35.7)
MCV: 96 fL (ref 79–97)
Platelets: 306 10*3/uL (ref 150–450)
RBC: 4.48 x10E6/uL (ref 3.77–5.28)
RDW: 11.8 % (ref 11.7–15.4)
WBC: 8.1 10*3/uL (ref 3.4–10.8)

## 2023-08-17 ENCOUNTER — Telehealth: Payer: Self-pay | Admitting: *Deleted

## 2023-08-17 NOTE — Telephone Encounter (Signed)
 Cardiac Catheterization scheduled at [facility] for: Wednesday August 19, 2023 11:30 AM Arrival time [facility] Main Entrance A at: 9 AM-needs BMP  Nothing to eat after midnight prior to procedure, clear liquids until 5 AM day of procedure.  Medication instructions: -Hold:  Insulin -AM of procedure/1/2 usual dose HS prior to procedure.  Lisinopril/HCT-AM of procedure -Usual morning medications can be taken with sips of water including aspirin 81 mg.  Patient reports Ozempic  weekly on Sundays. Plan to go home the same day, you will only stay overnight if medically necessary.  You must have responsible adult to drive you home.  Someone must be with you the first 24 hours after you arrive home.  Reviewed procedure instructions with patient.

## 2023-08-17 NOTE — Telephone Encounter (Signed)
 Call placed to [facility] to ask about BMP results ordered 08/13/23-per customer service BMP not done/order overlooked. Patient tells me she is unable to return to [facility] today for BMP, aware BMP will be done on arrival to Short Stay 08/19/23.

## 2023-08-18 ENCOUNTER — Ambulatory Visit: Payer: Self-pay | Admitting: Cardiovascular Disease

## 2023-08-18 DIAGNOSIS — Z01812 Encounter for preprocedural laboratory examination: Secondary | ICD-10-CM | POA: Diagnosis not present

## 2023-08-18 DIAGNOSIS — I25110 Atherosclerotic heart disease of native coronary artery with unstable angina pectoris: Secondary | ICD-10-CM

## 2023-08-19 ENCOUNTER — Other Ambulatory Visit: Payer: Self-pay

## 2023-08-19 ENCOUNTER — Encounter (HOSPITAL_COMMUNITY)
Admission: RE | Disposition: A | Discharge status: Home / Self Care | Payer: Self-pay | Source: Home / Self Care | Attending: Cardiovascular Disease

## 2023-08-19 ENCOUNTER — Ambulatory Visit (HOSPITAL_COMMUNITY)
Admission: RE | Admit: 2023-08-19 | Discharge: 2023-08-20 | Disposition: A | Discharge status: Home / Self Care | Attending: Cardiovascular Disease | Admitting: Cardiovascular Disease

## 2023-08-19 ENCOUNTER — Encounter (HOSPITAL_COMMUNITY): Payer: Self-pay | Admitting: Cardiovascular Disease

## 2023-08-19 DIAGNOSIS — Z7982 Long term (current) use of aspirin: Secondary | ICD-10-CM | POA: Insufficient documentation

## 2023-08-19 DIAGNOSIS — Z7902 Long term (current) use of antithrombotics/antiplatelets: Secondary | ICD-10-CM | POA: Insufficient documentation

## 2023-08-19 DIAGNOSIS — I10 Essential (primary) hypertension: Secondary | ICD-10-CM | POA: Insufficient documentation

## 2023-08-19 DIAGNOSIS — F32A Depression, unspecified: Secondary | ICD-10-CM | POA: Diagnosis not present

## 2023-08-19 DIAGNOSIS — Z01812 Encounter for preprocedural laboratory examination: Secondary | ICD-10-CM

## 2023-08-19 DIAGNOSIS — Z794 Long term (current) use of insulin: Secondary | ICD-10-CM | POA: Diagnosis not present

## 2023-08-19 DIAGNOSIS — G4730 Sleep apnea, unspecified: Secondary | ICD-10-CM | POA: Insufficient documentation

## 2023-08-19 DIAGNOSIS — Z6841 Body Mass Index (BMI) 40.0 and over, adult: Secondary | ICD-10-CM | POA: Insufficient documentation

## 2023-08-19 DIAGNOSIS — I25110 Atherosclerotic heart disease of native coronary artery with unstable angina pectoris: Secondary | ICD-10-CM | POA: Insufficient documentation

## 2023-08-19 DIAGNOSIS — I200 Unstable angina: Secondary | ICD-10-CM | POA: Diagnosis present

## 2023-08-19 DIAGNOSIS — Z7985 Long-term (current) use of injectable non-insulin antidiabetic drugs: Secondary | ICD-10-CM | POA: Insufficient documentation

## 2023-08-19 DIAGNOSIS — E119 Type 2 diabetes mellitus without complications: Secondary | ICD-10-CM | POA: Diagnosis not present

## 2023-08-19 DIAGNOSIS — Z79899 Other long term (current) drug therapy: Secondary | ICD-10-CM | POA: Diagnosis not present

## 2023-08-19 DIAGNOSIS — Z853 Personal history of malignant neoplasm of breast: Secondary | ICD-10-CM | POA: Diagnosis not present

## 2023-08-19 DIAGNOSIS — R55 Syncope and collapse: Secondary | ICD-10-CM | POA: Diagnosis not present

## 2023-08-19 DIAGNOSIS — F03A3 Unspecified dementia, mild, with mood disturbance: Secondary | ICD-10-CM | POA: Diagnosis not present

## 2023-08-19 DIAGNOSIS — Z955 Presence of coronary angioplasty implant and graft: Secondary | ICD-10-CM

## 2023-08-19 HISTORY — PX: CORONARY ANGIOGRAPHY: CATH118303

## 2023-08-19 HISTORY — PX: CORONARY STENT INTERVENTION: CATH118234

## 2023-08-19 LAB — BASIC METABOLIC PANEL WITH GFR
BUN/Creatinine Ratio: 22 (ref 12–28)
BUN: 18 mg/dL (ref 8–27)
CO2: 22 mmol/L (ref 20–29)
Calcium: 9.2 mg/dL (ref 8.7–10.3)
Chloride: 101 mmol/L (ref 96–106)
Creatinine, Ser: 0.83 mg/dL (ref 0.57–1.00)
Glucose: 167 mg/dL — ABNORMAL HIGH (ref 70–99)
Potassium: 4.6 mmol/L (ref 3.5–5.2)
Sodium: 139 mmol/L (ref 134–144)
eGFR: 75 mL/min/{1.73_m2} (ref >59)

## 2023-08-19 LAB — GLUCOSE, CAPILLARY
Glucose-Capillary: 138 mg/dL — ABNORMAL HIGH (ref 70–99)
Glucose-Capillary: 122 mg/dL — ABNORMAL HIGH (ref 70–99)
Glucose-Capillary: 117 mg/dL — ABNORMAL HIGH (ref 70–99)
Glucose-Capillary: 149 mg/dL — ABNORMAL HIGH (ref 70–99)

## 2023-08-19 LAB — POCT ACTIVATED CLOTTING TIME
Activated Clotting Time: 262 s
Activated Clotting Time: 285 s
Activated Clotting Time: 205 s
Activated Clotting Time: 164 s
Activated Clotting Time: 181 s

## 2023-08-19 SURGERY — CORONARY ANGIOGRAPHY (CATH LAB)
Anesthesia: LOCAL

## 2023-08-19 MED ORDER — ASPIRIN 81 MG PO CHEW
81.0000 mg | CHEWABLE_TABLET | ORAL | Status: AC
  Administered 2023-08-19: 81 mg via ORAL
  Filled 2023-08-19: qty 1

## 2023-08-19 MED ORDER — SODIUM CHLORIDE 0.9 % WEIGHT BASED INFUSION: 3.0000 mL/kg/h | INTRAVENOUS | Status: DC

## 2023-08-19 MED ORDER — ATROPINE SULFATE 1 MG/10ML IJ SOSY
PREFILLED_SYRINGE | INTRAMUSCULAR | Status: AC
  Administered 2023-08-19: 1 mg
  Filled 2023-08-19: qty 10

## 2023-08-19 MED ORDER — IOHEXOL 350 MG/ML SOLN
INTRAVENOUS | Status: DC | PRN
  Administered 2023-08-19: 100 mL

## 2023-08-19 MED ORDER — CLOPIDOGREL BISULFATE 300 MG PO TABS
ORAL_TABLET | ORAL | Status: AC
  Filled 2023-08-19: qty 2

## 2023-08-19 MED ORDER — FENTANYL CITRATE (PF) 100 MCG/2ML IJ SOLN
INTRAMUSCULAR | Status: DC | PRN
  Administered 2023-08-19 (×3): 25 ug via INTRAVENOUS

## 2023-08-19 MED ORDER — FENTANYL CITRATE (PF) 100 MCG/2ML IJ SOLN
INTRAMUSCULAR | Status: AC
  Filled 2023-08-19: qty 2

## 2023-08-19 MED ORDER — LIDOCAINE HCL (PF) 1 % IJ SOLN
INTRAMUSCULAR | Status: DC | PRN
  Administered 2023-08-19: 15 mL
  Administered 2023-08-19: 2 mL

## 2023-08-19 MED ORDER — HYDROCHLOROTHIAZIDE 12.5 MG PO TABS
12.5000 mg | ORAL_TABLET | Freq: Every day | ORAL | Status: DC
  Administered 2023-08-20: 12.5 mg via ORAL
  Filled 2023-08-19: qty 1

## 2023-08-19 MED ORDER — HYDRALAZINE HCL 20 MG/ML IJ SOLN
10.0000 mg | INTRAMUSCULAR | Status: AC | PRN
  Filled 2023-08-19: qty 1

## 2023-08-19 MED ORDER — MIDAZOLAM HCL 2 MG/2ML IJ SOLN
INTRAMUSCULAR | Status: AC
  Filled 2023-08-19: qty 2

## 2023-08-19 MED ORDER — SODIUM CHLORIDE 0.9 % IV SOLN
INTRAVENOUS | Status: AC
Start: 2023-08-19 — End: 2023-08-19

## 2023-08-19 MED ORDER — CLOPIDOGREL BISULFATE 75 MG PO TABS
75.0000 mg | ORAL_TABLET | Freq: Every day | ORAL | Status: DC
  Administered 2023-08-20: 75 mg via ORAL
  Filled 2023-08-19: qty 1

## 2023-08-19 MED ORDER — SODIUM CHLORIDE 0.9 % WEIGHT BASED INFUSION: 1.0000 mL/kg/h | INTRAVENOUS | Status: DC

## 2023-08-19 MED ORDER — MIDAZOLAM HCL 2 MG/2ML IJ SOLN
INTRAMUSCULAR | Status: DC | PRN
  Administered 2023-08-19 (×3): 1 mg via INTRAVENOUS

## 2023-08-19 MED ORDER — VENLAFAXINE HCL ER 150 MG PO CP24: 150.0000 mg | ORAL_CAPSULE | Freq: Every day | ORAL | Status: DC

## 2023-08-19 MED ORDER — LISINOPRIL 20 MG PO TABS
10.0000 mg | ORAL_TABLET | Freq: Every day | ORAL | Status: DC
  Administered 2023-08-20: 10 mg via ORAL
  Filled 2023-08-19: qty 1

## 2023-08-19 MED ORDER — SODIUM CHLORIDE 0.9 % IV SOLN
INTRAVENOUS | Status: DC | PRN
  Administered 2023-08-19: 250 mL via INTRAVENOUS

## 2023-08-19 MED ORDER — SODIUM CHLORIDE 0.9% FLUSH: 3.0000 mL | INTRAVENOUS | Status: DC | PRN

## 2023-08-19 MED ORDER — SODIUM CHLORIDE 0.9% FLUSH
3.0000 mL | Freq: Two times a day (BID) | INTRAVENOUS | Status: DC
  Administered 2023-08-19 – 2023-08-20 (×2): 3 mL via INTRAVENOUS

## 2023-08-19 MED ORDER — VENLAFAXINE HCL ER 150 MG PO CP24
150.0000 mg | ORAL_CAPSULE | Freq: Every day | ORAL | Status: DC
  Administered 2023-08-19: 150 mg via ORAL
  Filled 2023-08-19: qty 1

## 2023-08-19 MED ORDER — SODIUM CHLORIDE 0.9 % IV SOLN: 250.0000 mL | INTRAVENOUS | Status: DC | PRN

## 2023-08-19 MED ORDER — LABETALOL HCL 5 MG/ML IV SOLN
INTRAVENOUS | Status: AC
Start: 2023-08-19 — End: 2023-08-20
  Filled 2023-08-19: qty 4

## 2023-08-19 MED ORDER — HEPARIN (PORCINE) IN NACL 2000-0.9 UNIT/L-% IV SOLN
INTRAVENOUS | Status: DC | PRN
  Administered 2023-08-19: 1000 mL

## 2023-08-19 MED ORDER — LABETALOL HCL 5 MG/ML IV SOLN: 10.0000 mg | INTRAVENOUS | Status: AC | PRN

## 2023-08-19 MED ORDER — ASPIRIN 81 MG PO TBEC
81.0000 mg | DELAYED_RELEASE_TABLET | Freq: Every day | ORAL | Status: DC
  Administered 2023-08-20: 81 mg via ORAL
  Filled 2023-08-19: qty 1

## 2023-08-19 MED ORDER — CLOPIDOGREL BISULFATE 300 MG PO TABS
ORAL_TABLET | ORAL | Status: DC | PRN
  Administered 2023-08-19: 600 mg via ORAL

## 2023-08-19 MED ORDER — METOPROLOL SUCCINATE ER 25 MG PO TB24
25.0000 mg | ORAL_TABLET | Freq: Every day | ORAL | Status: DC
  Administered 2023-08-20: 25 mg via ORAL
  Filled 2023-08-19: qty 1

## 2023-08-19 MED ORDER — VERAPAMIL HCL 2.5 MG/ML IV SOLN
INTRAVENOUS | Status: DC | PRN
  Administered 2023-08-19: 10 mL via INTRA_ARTERIAL

## 2023-08-19 MED ORDER — HEPARIN SODIUM (PORCINE) 1000 UNIT/ML IJ SOLN
INTRAMUSCULAR | Status: AC
  Filled 2023-08-19: qty 10

## 2023-08-19 MED ORDER — LISINOPRIL-HYDROCHLOROTHIAZIDE 10-12.5 MG PO TABS: 1.0000 | ORAL_TABLET | Freq: Every day | ORAL | Status: DC

## 2023-08-19 MED ORDER — INSULIN GLARGINE-YFGN 100 UNIT/ML ~~LOC~~ SOLN
45.0000 [IU] | Freq: Every day | SUBCUTANEOUS | Status: DC
  Administered 2023-08-19: 45 [IU] via SUBCUTANEOUS
  Filled 2023-08-19 (×2): qty 0.45

## 2023-08-19 MED ORDER — LABETALOL HCL 5 MG/ML IV SOLN
10.0000 mg | INTRAVENOUS | Status: AC | PRN
  Administered 2023-08-19 (×2): 10 mg via INTRAVENOUS

## 2023-08-19 MED ORDER — HYDRALAZINE HCL 20 MG/ML IJ SOLN
10.0000 mg | INTRAMUSCULAR | Status: AC | PRN
  Administered 2023-08-19: 10 mg via INTRAVENOUS

## 2023-08-19 MED ORDER — ATROPINE SULFATE 1 MG/10ML IJ SOSY: 1.0000 mg | PREFILLED_SYRINGE | Freq: Once | INTRAMUSCULAR | Status: DC

## 2023-08-19 MED ORDER — EZETIMIBE 10 MG PO TABS
10.0000 mg | ORAL_TABLET | Freq: Every day | ORAL | Status: DC
  Administered 2023-08-20: 10 mg via ORAL
  Filled 2023-08-19: qty 1

## 2023-08-19 MED ORDER — LIDOCAINE HCL (PF) 1 % IJ SOLN
INTRAMUSCULAR | Status: AC
  Filled 2023-08-19: qty 30

## 2023-08-19 MED ORDER — ONDANSETRON HCL 4 MG/2ML IJ SOLN: 4.0000 mg | Freq: Four times a day (QID) | INTRAMUSCULAR | Status: DC | PRN

## 2023-08-19 MED ORDER — LABETALOL HCL 5 MG/ML IV SOLN
INTRAVENOUS | Status: AC
  Filled 2023-08-19: qty 4

## 2023-08-19 MED ORDER — HEPARIN SODIUM (PORCINE) 1000 UNIT/ML IJ SOLN
INTRAMUSCULAR | Status: DC | PRN
  Administered 2023-08-19: 2000 [IU] via INTRAVENOUS
  Administered 2023-08-19: 10000 [IU] via INTRAVENOUS

## 2023-08-19 MED ORDER — VERAPAMIL HCL 2.5 MG/ML IV SOLN
INTRAVENOUS | Status: AC
  Filled 2023-08-19: qty 2

## 2023-08-19 MED ORDER — HEPARIN SODIUM (PORCINE) 1000 UNIT/ML IJ SOLN
INTRAMUSCULAR | Status: AC
Start: 2023-08-19 — End: ?
  Filled 2023-08-19: qty 10

## 2023-08-19 SURGICAL SUPPLY — 22 items
BALLOON EMERGE MR 2.0X12 (BALLOONS) IMPLANT
CATH 5FR JL3.5 JR4 ANG PIG MP (CATHETERS) IMPLANT
CATH QUICKCROSS SUPP .035X90CM (MICROCATHETER) IMPLANT
CATH VISTA GUIDE 6FR XB3 MULPK (CATHETERS) IMPLANT
COVER PRB 48X5XTLSCP FOLD TPE (BAG) IMPLANT
DEVICE RAD COMP TR BAND LRG (VASCULAR PRODUCTS) IMPLANT
ELECT DEFIB PAD ADLT CADENCE (PAD) IMPLANT
GLIDESHEATH SLEND SS 6F .021 (SHEATH) IMPLANT
GUIDEWIRE INQWIRE 1.5J.035X260 (WIRE) IMPLANT
GUIDEWIRE TIGER .035X300 (WIRE) IMPLANT
KIT ENCORE 26 ADVANTAGE (KITS) IMPLANT
KIT MICROPUNCTURE NIT STIFF (SHEATH) IMPLANT
PACK CARDIAC CATHETERIZATION (CUSTOM PROCEDURE TRAY) ×1 IMPLANT
SET ATX-X65L (MISCELLANEOUS) IMPLANT
SHEATH PINNACLE 5F 10CM (SHEATH) IMPLANT
SHEATH PINNACLE 6F 10CM (SHEATH) IMPLANT
STENT ONYX FRONTIER 2.25X15 (Permanent Stent) IMPLANT
TUBING CIL FLEX 10 FLL-RA (TUBING) IMPLANT
WIRE AMPLATZ SS-J .035X180CM (WIRE) IMPLANT
WIRE ASAHI PROWATER 180CM (WIRE) IMPLANT
WIRE EMERALD 3MM-J .035X150CM (WIRE) IMPLANT
WIRE HI TORQ WHISPER MS 190CM (WIRE) IMPLANT

## 2023-08-19 NOTE — Progress Notes (Signed)
 TR band removed gauze and tegaderm applied site is clean dry and intact.

## 2023-08-19 NOTE — Progress Notes (Signed)
 SITE AREA: right groin/femoral  SITE PRIOR TO REMOVAL:  LEVEL 0  PRESSURE APPLIED FOR: approximately 32 to 33 minutes  MANUAL: yes  PATIENT STATUS DURING PULL: pt vagaled, see progress note  POST PULL SITE:  LEVEL 0  POST PULL INSTRUCTIONS GIVEN: yes, drsg x 24 hours, may shower tomorrow evening, no sitting in water x 1 week, no hot tubs, bath tubs, or pools, climb easy on stairs and into and out of vehicles, no running, jumping, if bleeding is noted or anything feels warm/moist to groin area, please notify staff, if bleeding occurs at home call 911  POST PULL PULSES PRESENT: bilateral pedal pulse at +2  DRESSING APPLIED: gauze with tegaderm  BEDREST BEGINS @ 1822  COMMENTS:

## 2023-08-19 NOTE — Progress Notes (Signed)
 34F sheath removed from right groin by Elinor Guardian, 2H RN, pt vagaled quickly after removal, see flowsheets for VS, HR decreased to 30's and SBP decreased to 60's to 80's, pt states she feels sweaty, this RN, 2H RN and Alisa App, RN at bedside, pt remains in trendelenburg position, cool wash cloth to forehead, pt not nauseated at 1st, but then stated she was, but did not like zofran , support given, accu-check at 149, safety maintained, see MAR   Dr Abel Hoe notified at [phone] of situation

## 2023-08-19 NOTE — Interval H&P Note (Signed)
 History and Physical Interval Note:  08/19/2023 11:29 AM  [name]  has presented today for surgery, with the diagnosis of cad - unstable angina.  The various methods of treatment have been discussed with the patient and family. After consideration of risks, benefits and other options for treatment, the patient has consented to  Procedure(s): LEFT HEART CATH AND CORONARY ANGIOGRAPHY (N/A) as a surgical intervention.  The patient's history has been reviewed, patient examined, no change in status, stable for surgery.  I have reviewed the patient's chart and labs.  Questions were answered to the patient's satisfaction.    Cath Lab Visit (complete for each Cath Lab visit)  Clinical Evaluation Leading to the Procedure:   ACS: No.  Non-ACS:    Anginal Classification: CCS III  Anti-ischemic medical therapy: Minimal Therapy (1 class of medications)  Non-Invasive Test Results: High-risk stress test findings: cardiac mortality >3%/year (coronary CTA with possible severe stenosis)  Prior CABG: No previous CABG        Miranda Fuller

## 2023-08-20 ENCOUNTER — Encounter (HOSPITAL_COMMUNITY): Payer: Self-pay | Admitting: Cardiovascular Disease

## 2023-08-20 ENCOUNTER — Other Ambulatory Visit (HOSPITAL_COMMUNITY): Payer: Self-pay

## 2023-08-20 DIAGNOSIS — E119 Type 2 diabetes mellitus without complications: Secondary | ICD-10-CM | POA: Diagnosis not present

## 2023-08-20 DIAGNOSIS — I200 Unstable angina: Secondary | ICD-10-CM

## 2023-08-20 DIAGNOSIS — I10 Essential (primary) hypertension: Secondary | ICD-10-CM | POA: Diagnosis not present

## 2023-08-20 DIAGNOSIS — Z853 Personal history of malignant neoplasm of breast: Secondary | ICD-10-CM | POA: Diagnosis not present

## 2023-08-20 DIAGNOSIS — Z794 Long term (current) use of insulin: Secondary | ICD-10-CM | POA: Diagnosis not present

## 2023-08-20 DIAGNOSIS — I25110 Atherosclerotic heart disease of native coronary artery with unstable angina pectoris: Secondary | ICD-10-CM | POA: Diagnosis not present

## 2023-08-20 DIAGNOSIS — Z7982 Long term (current) use of aspirin: Secondary | ICD-10-CM | POA: Diagnosis not present

## 2023-08-20 DIAGNOSIS — Z7985 Long-term (current) use of injectable non-insulin antidiabetic drugs: Secondary | ICD-10-CM | POA: Diagnosis not present

## 2023-08-20 DIAGNOSIS — Z79899 Other long term (current) drug therapy: Secondary | ICD-10-CM | POA: Diagnosis not present

## 2023-08-20 DIAGNOSIS — G4730 Sleep apnea, unspecified: Secondary | ICD-10-CM | POA: Diagnosis not present

## 2023-08-20 DIAGNOSIS — R55 Syncope and collapse: Secondary | ICD-10-CM | POA: Diagnosis not present

## 2023-08-20 DIAGNOSIS — Z7902 Long term (current) use of antithrombotics/antiplatelets: Secondary | ICD-10-CM | POA: Diagnosis not present

## 2023-08-20 LAB — BASIC METABOLIC PANEL WITH GFR
Anion gap: 10 (ref 5–15)
BUN: 20 mg/dL (ref 8–23)
CO2: 22 mmol/L (ref 22–32)
Calcium: 8.7 mg/dL — ABNORMAL LOW (ref 8.9–10.3)
Chloride: 106 mmol/L (ref 98–111)
Creatinine, Ser: 0.96 mg/dL (ref 0.44–1.00)
GFR, Estimated: >60 mL/min (ref >60)
Glucose, Bld: 173 mg/dL — ABNORMAL HIGH (ref 70–99)
Potassium: 4 mmol/L (ref 3.5–5.1)
Sodium: 138 mmol/L (ref 135–145)

## 2023-08-20 LAB — CBC
HCT: 35.2 % — ABNORMAL LOW (ref 36.0–46.0)
Hemoglobin: 11.6 g/dL — ABNORMAL LOW (ref 12.0–15.0)
MCH: 31.4 pg (ref 26.0–34.0)
MCHC: 33 g/dL (ref 30.0–36.0)
MCV: 95.4 fL (ref 80.0–100.0)
Platelets: 247 10*3/uL (ref 150–400)
RBC: 3.69 MIL/uL — ABNORMAL LOW (ref 3.87–5.11)
RDW: 12.7 % (ref 11.5–15.5)
WBC: 7.9 10*3/uL (ref 4.0–10.5)
nRBC: 0 % (ref 0.0–0.2)

## 2023-08-20 MED ORDER — NITROGLYCERIN 0.4 MG SL SUBL
0.4000 mg | SUBLINGUAL_TABLET | SUBLINGUAL | 2 refills | Status: AC | PRN
  Filled 2023-08-20: qty 25, 5d supply, fill #0

## 2023-08-20 MED ORDER — CLOPIDOGREL BISULFATE 75 MG PO TABS
75.0000 mg | ORAL_TABLET | Freq: Every day | ORAL | 2 refills | Status: AC
  Filled 2023-08-20: qty 90, 90d supply, fill #0

## 2023-08-20 NOTE — Progress Notes (Signed)
 CARDIAC REHAB PHASE I   PRE:  Rate/Rhythm: 90 NSR  BP:  Laying: 150/67      SpO2: 99% RA  MODE:  Ambulation: 200 ft    POST:  Rate/Rhythm: 103 ST  BP:  Sitting: 160/68      SpO2: 98% RA  Pt ambulated with hands-on assist in the hallway, pt asymtpmatic throughout sessiona nd returned back to bed. Pt educated on PCI restrictions, ASA and Plavix use, HH and diabetic diet, walking guidelines, and CRP2 at AP. Pt is interested in the program. All needs met prior to leaving.  Barkley Li  MS, ACSM-CEP 9:31 AM 08/20/2023    Service time is from 0901 to 0931.

## 2023-08-20 NOTE — TOC CM/SW Note (Signed)
 Transition of Care [facility]) - Inpatient Brief Assessment   Patient Details  Name: Miranda Fuller MRN: [id number] Date of Birth: [birth year]  [facility]) CM/SW Contact:    Juliane Och, LCSW Phone Number: 08/20/2023, 9:16 AM   Clinical Narrative:  9:16 AM Per chart review, patient resides at home with child(ren). Patient has a PCP and insurance. Patient does not have SNF/HH/DME history. No TOC needs were identified at this time. TOC will continue to follow and be available to assist.  Transition of Care Asessment: Insurance and Status: Insurance coverage has been reviewed Patient has primary care physician: Yes Home environment has been reviewed: Private Residence Prior level of function:: N/A Prior/Current Home Services: No current home services Social Drivers of Health Review: SDOH reviewed no interventions necessary Readmission risk has been reviewed: Yes Transition of care needs: no transition of care needs at this time

## 2023-08-20 NOTE — TOC Transition Note (Signed)
 Transition of Care [facility]) - Discharge Note   Patient Details  Name: [name] MRN: [id number] Date of Birth: [birth year]  [facility]) CM/SW Contact:  Jeani Mill, RN Phone Number: 08/20/2023, 1:16 PM   Clinical Narrative:    Miranda Fuller is stable to discharge home.  No TOC needs at this time.    Final next level of care: Home/Self Care Barriers to Discharge: Barriers Resolved   Patient Goals and CMS Choice Patient states their goals for this hospitalization and ongoing recovery are:: Return home          Discharge Placement               Home        Discharge Plan and Services Additional resources added to the After Visit Summary for                                       Social Drivers of Health (SDOH) Interventions SDOH Screenings   Food Insecurity: No Food Insecurity (08/19/2023)  Housing: Low Risk  (08/20/2023)  Transportation Needs: No Transportation Needs (08/20/2023)  Utilities: Not At Risk (08/20/2023)  Social Connections: Unknown (08/20/2023)  Tobacco Use: Low Risk  (08/20/2023)     Readmission Risk Interventions     No data to display

## 2023-08-20 NOTE — Discharge Summary (Signed)
 Discharge Summary   Patient ID: Miranda Fuller MRN: [id number]; DOB: [birth year]  Admit date: 08/19/2023 Discharge date: 08/20/2023  PCP:  Orest Bio, MD   [facility] Providers Cardiologist:  Antoinette Batman, MD   Discharge Diagnoses  Principal Problem:   Unstable angina [facility])  Diagnostic Studies/Procedures   Cath: 08/19/2023    1st Mrg lesion is 99% stenosed.   Mid LAD lesion is 40% stenosed.   A drug-eluting stent was successfully placed using a STENT ONYX FRONTIER 2.25X15.   Post intervention, there is a 0% residual stenosis.   Severe stenosis first obtuse marginal branch Successful PTCA/DES x 1 obtuse marginal branch.  Non-obstructive moderate disease in the mid LAD Small non-dominant RCA   Recommendations: DAPT with ASA and Plavix for at least six months. Monitor overnight given complicated groin access as described above.   Diagnostic Dominance: Left  Intervention   _____________   History of Present Illness   Miranda Fuller is a 73 y.o. female with history of breast cancer, DM, HTN, obesity, sleep apnea and mild dementia who was seen in the office on 08/03/2023. Initially seen as a new patient in 2020 for the evaluation of chest pain. She had a cardiac cath in 2011 which showed mild luminal irregularities but no obstructive CAD. Exercise stress test in 2016 with no ischemic EKG changes. Echo December 2019 with LVEF=60-65%, sclerotic aortic valve without stenosis. Coronary CTA in March 2020 with mild to moderate non-obstructive disease in the LAD and Circumflex with probable flow limiting disease in the small caliber obtuse marginal branch (small caliber vessel). Seen April 2025 and she c/o left sided chest pain with exertion or at rest associated with dyspnea in addition to a syncopal episode in November 2024 and near syncope in March 2025. Coronary CTA April 2025 with moderate LAD stenosis and severe stenosis in the small caliber OM branch. Cardiac monitor  May 2025 with sinus with SVT and VT. Toprol  was started.   When in the office on 5/19 she continued to have left sided chest pain several days a week with exertion. As noted above, severe disease on CCTa therefore she was set up for cardiac cath.   Hospital Course    Underwent cardiac cath 6/4 with severe stenosis of 1st OM with successful PCI/DES x1 with recommendations for DAPT with ASA/plavix for at least 6 months. She was monitored overnight given complicated groin access as noted in cath report. See by cardiac rehab. No issues post cath.   Seen by Dr. [place] and deemed stable for discharge home. Follow up arranged in the office. Medications sent to [facility] pharmacy. Educated by pharmD prior to discharge.     Did the patient have an acute coronary syndrome (MI, NSTEMI, STEMI, etc) this admission?:  No                               Did the patient have a percutaneous coronary intervention (stent / angioplasty)?:  Yes.     Cath/PCI Registry Performance & Quality Measures: Aspirin prescribed? - Yes ADP Receptor Inhibitor (Plavix/Clopidogrel, Brilinta/Ticagrelor or Effient/Prasugrel) prescribed (includes medically managed patients)? - Yes High Intensity Statin (Lipitor 40-80mg  or Crestor 20-40mg ) prescribed? - No - statin intolerant, on zetia. Lipid clinic referral has been discussed in the past. For EF <40%, was ACEI/ARB prescribed? - Not Applicable (EF >/= 40%) For EF <40%, Aldosterone Antagonist (Spironolactone or Eplerenone) prescribed? - Not Applicable (EF >/= 40%)  Cardiac Rehab Phase II ordered? - Yes   The patient will be scheduled for a TOC follow up appointment in 10-14 days.  A message has been sent to the [facility] and Scheduling Pool at the office where the patient should be seen for follow up.  _____________  Discharge Vitals Blood pressure (!) 142/65, pulse 92, temperature 98.1 F (36.7 C), temperature source Oral, resp. rate 15, height 4\' 11"  (1.499 m), weight 91.7 kg, SpO2 99%.   Filed Weights   08/19/23 1003 08/19/23 1859  Weight: 88 kg 91.7 kg    Labs & Radiologic Studies  CBC Recent Labs    08/20/23 0221  WBC 7.9  HGB 11.6*  HCT 35.2*  MCV 95.4  PLT 247   Basic Metabolic Panel Recent Labs    16/10/96 1556 08/20/23 0221  NA 139 138  K 4.6 4.0  CL 101 106  CO2 22 22  GLUCOSE 167* 173*  BUN 18 20  CREATININE 0.83 0.96  CALCIUM 9.2 8.7*   Liver Function Tests No results for input(s): "AST", "ALT", "ALKPHOS", "BILITOT", "PROT", "ALBUMIN" in the last 72 hours. No results for input(s): "LIPASE", "AMYLASE" in the last 72 hours. High Sensitivity Troponin:   No results for input(s): "TROPONINIHS" in the last 720 hours.  No results for input(s): "TRNPT" in the last 720 hours.  BNP Invalid input(s): "POCBNP" No results for input(s): "PROBNP" in the last 72 hours.  No results for input(s): "BNP" in the last 72 hours.  D-Dimer No results for input(s): "DDIMER" in the last 72 hours. Hemoglobin A1C No results for input(s): "HGBA1C" in the last 72 hours. Fasting Lipid Panel No results for input(s): "CHOL", "HDL", "LDLCALC", "TRIG", "CHOLHDL", "LDLDIRECT" in the last 72 hours. No results found for: "LIPOA"  Thyroid  Function Tests No results for input(s): "TSH", "T4TOTAL", "T3FREE", "THYROIDAB" in the last 72 hours.  Invalid input(s): "FREET3" _____________  CARDIAC CATHETERIZATION Result Date: 08/19/2023   1st Mrg lesion is 99% stenosed.   Mid LAD lesion is 40% stenosed.   A drug-eluting stent was successfully placed using a STENT ONYX FRONTIER 2.25X15.   Post intervention, there is a 0% residual stenosis. Severe stenosis first obtuse marginal branch Successful PTCA/DES x 1 obtuse marginal branch. Non-obstructive moderate disease in the mid LAD Small non-dominant RCA Recommendations: DAPT with ASA and Plavix for at least six months. Monitor overnight given complicated groin access as described above.    Disposition Pt is being discharged home today  in good condition.  Follow-up Plans & Appointments  Discharge Instructions     Amb Referral to Cardiac Rehabilitation   Complete by: As directed    Diagnosis: Coronary Stents   After initial evaluation and assessments completed: Virtual Based Care may be provided alone or in conjunction with Phase 2 Cardiac Rehab based on patient barriers.: Yes   Intensive Cardiac Rehabilitation (ICR) [facility] location only OR Traditional Cardiac Rehabilitation (TCR) *If criteria for ICR are not met will enroll in TCR [facility] only): Yes   Call MD for:  redness, tenderness, or signs of infection (pain, swelling, redness, odor or green/yellow discharge around incision site)   Complete by: As directed    Diet - low sodium heart healthy   Complete by: As directed    Discharge instructions   Complete by: As directed    Groin Site Care Refer to this sheet in the next few weeks. These instructions provide you with information on caring for yourself after your procedure. Your caregiver may also give  you more specific instructions. Your treatment has been planned according to current medical practices, but problems sometimes occur. Call your caregiver if you have any problems or questions after your procedure. HOME CARE INSTRUCTIONS You may shower 24 hours after the procedure. Remove the bandage (dressing) and gently wash the site with plain soap and water. Gently pat the site dry.  Do not apply powder or lotion to the site.  Do not sit in a bathtub, swimming pool, or whirlpool for 5 to 7 days.  No bending, squatting, or lifting anything over 10 pounds (4.5 kg) as directed by your caregiver.  Inspect the site at least twice daily.  Do not drive home if you are discharged the same day of the procedure. Have someone else drive you.  You may drive 24 hours after the procedure unless otherwise instructed by your caregiver.  What to expect: Any bruising will usually fade within 1 to 2 weeks.  Blood that collects in the tissue  (hematoma) may be painful to the touch. It should usually decrease in size and tenderness within 1 to 2 weeks.  SEEK IMMEDIATE MEDICAL CARE IF: You have unusual pain at the groin site or down the affected leg.  You have redness, warmth, swelling, or pain at the groin site.  You have drainage (other than a small amount of blood on the dressing).  You have chills.  You have a fever or persistent symptoms for more than 72 hours.  You have a fever and your symptoms suddenly get worse.  Your leg becomes pale, cool, tingly, or numb.  You have heavy bleeding from the site. Hold pressure on the site. Aaron Aas  PLEASE DO NOT MISS ANY DOSES OF YOUR PLAVIX!!!!! Also keep a log of you blood pressures and bring back to your follow up appt. Please call the office with any questions.   Patients taking blood thinners should generally stay away from medicines like ibuprofen, Advil, Motrin, naproxen, and Aleve due to risk of stomach bleeding. You may take Tylenol  as directed or talk to your primary doctor about alternatives.   PLEASE ENSURE THAT YOU DO NOT RUN OUT OF YOUR PLAVIX. This medication is very important to remain on for at least 6months. IF you have issues obtaining this medication due to cost please CALL the office 3-5 business days prior to running out in order to prevent missing doses of this medication.   Increase activity slowly   Complete by: As directed        Discharge Medications Allergies as of 08/20/2023       Reactions   Oxycodone-acetaminophen  Other (See Comments), Itching, Rash, Hives   Other reaction(s): Hallucinations   Propoxyphene Other (See Comments), Hives, Itching, Rash   Other reaction(s): Delusions (intolerance)   Demerol [meperidine] Other (See Comments)   hallucinations   Morphine And Codeine Hives   Oxycodone-aspirin Other (See Comments), Hives   Chlorhexidine  Rash        Medication List     TAKE these medications    anastrozole  1 MG tablet Commonly known as:  ARIMIDEX  Take 1 tablet (1 mg total) by mouth daily. What changed: Another medication with the same name was removed. Continue taking this medication, and follow the directions you see here.   aspirin 81 MG tablet Take 81 mg by mouth daily.   CALCIUM 1200 PO Take 1,200 mg by mouth 2 (two) times daily.   calcium carbonate 1250 (500 Ca) MG chewable tablet Commonly known as: OS-CAL Chew 1 tablet  by mouth daily.   clopidogrel 75 MG tablet Commonly known as: PLAVIX Take 1 tablet (75 mg total) by mouth daily with breakfast. Start taking on: August 21, 2023   cyanocobalamin  1000 MCG tablet Commonly known as: VITAMIN B12 Take 1,000 mcg by mouth 2 (two) times daily.   ezetimibe 10 MG tablet Commonly known as: ZETIA Take 10 mg by mouth daily.   insulin  aspart 100 UNIT/ML injection Commonly known as: novoLOG  Inject 5-15 Units into the skin 3 (three) times daily with meals. 5-15 units on sliding scale   insulin  glargine 100 UNIT/ML injection Commonly known as: LANTUS Inject 45 Units into the skin at bedtime.   lisinopril-hydrochlorothiazide 10-12.5 MG tablet Commonly known as: ZESTORETIC Take 1 tablet by mouth daily.   metoprolol  succinate 25 MG 24 hr tablet Commonly known as: Toprol  XL Take 1 tablet (25 mg total) by mouth daily.   MULTIVITAMIN ADULT PO Take 1 tablet by mouth daily.   nitroGLYCERIN  0.4 MG SL tablet Commonly known as: Nitrostat  Place 1 tablet (0.4 mg total) under the tongue every 5 (five) minutes as needed.   Ozempic  (1 MG/DOSE) 4 MG/3ML Sopn Generic drug: Semaglutide  (1 MG/DOSE) Inject 1 mg into the skin once a week.   venlafaxine XR 150 MG 24 hr capsule Commonly known as: EFFEXOR-XR Take 150 mg by mouth daily.   Vitamin D -3 25 MCG (1000 UT) Caps Take 3,000 Units by mouth daily.         Outstanding Labs/Studies  N/a   Duration of Discharge Encounter: APP Time: 20 minutes   Signed, Johnie Nailer, NP 08/20/2023, 10:19 AM

## 2023-08-20 NOTE — Progress Notes (Signed)
  Progress Note  Patient Name: Miranda Fuller Date of Encounter: 08/20/2023 [facility] Cardiologist: Miranda Batman, MD   Interval Summary   Feels well today. No chest pain or SOB  Vital Signs Vitals:   08/19/23 2345 08/20/23 0000 08/20/23 0330 08/20/23 0755  BP: 113/63 126/68 (!) 149/64 (!) 142/65  Pulse: 94 93  92  Resp: 17 18 14 15   Temp: 99.3 F (37.4 C)  98.6 F (37 C) 98.1 F (36.7 C)  TempSrc: Axillary  Oral Oral  SpO2: 97%  97% 99%  Weight:      Height:        Intake/Output Summary (Last 24 hours) at 08/20/2023 0906 Last data filed at 08/20/2023 0827 Gross per 24 hour  Intake 730 ml  Output --  Net 730 ml      08/19/2023    6:59 PM 08/19/2023   10:03 AM 08/03/2023    2:35 PM  Last 3 Weights  Weight (lbs) 202 lb 2.6 oz 194 lb 197 lb 6.4 oz  Weight (kg) 91.7 kg 87.998 kg 89.54 kg      Telemetry/ECG  NSR - Personally Reviewed  Physical Exam  GEN: No acute distress.   Neck: No JVD Cardiac: RRR, no murmurs, rubs, or gallops.  Respiratory: Clear to auscultation bilaterally. GI: Soft, nontender, non-distended  MS: No edema. Right radial and right femoral cath sites look good without hematoma  Assessment & Plan  CAD s/p PCI of the OM with DES. On DAPT with ASA and Plavix.  Labs and Ecg stable today.  I don't see a recent lipid panel but patient reports this is followed by Dr Miranda Fuller in [place]  She is stable for DC home today  Physician time 10 minutes     For questions or updates, please contact [facility] Please consult [email or web address] for contact info under       Signed, Miranda Lepage [place], MD

## 2023-08-20 NOTE — Plan of Care (Signed)

## 2023-08-26 ENCOUNTER — Ambulatory Visit (HOSPITAL_COMMUNITY)

## 2023-09-01 ENCOUNTER — Ambulatory Visit (HOSPITAL_COMMUNITY)

## 2023-09-11 ENCOUNTER — Ambulatory Visit: Attending: Nurse Practitioner | Admitting: Nurse Practitioner

## 2023-09-11 ENCOUNTER — Encounter: Payer: Self-pay | Admitting: Nurse Practitioner

## 2023-09-11 VITALS — BP 130/68 | HR 86 | Ht 59.0 in | Wt 200.4 lb

## 2023-09-11 DIAGNOSIS — G4733 Obstructive sleep apnea (adult) (pediatric): Secondary | ICD-10-CM | POA: Diagnosis not present

## 2023-09-11 DIAGNOSIS — I471 Supraventricular tachycardia, unspecified: Secondary | ICD-10-CM

## 2023-09-11 DIAGNOSIS — I4729 Other ventricular tachycardia: Secondary | ICD-10-CM

## 2023-09-11 DIAGNOSIS — E785 Hyperlipidemia, unspecified: Secondary | ICD-10-CM

## 2023-09-11 DIAGNOSIS — Z87898 Personal history of other specified conditions: Secondary | ICD-10-CM

## 2023-09-11 DIAGNOSIS — E118 Type 2 diabetes mellitus with unspecified complications: Secondary | ICD-10-CM | POA: Diagnosis not present

## 2023-09-11 DIAGNOSIS — Z794 Long term (current) use of insulin: Secondary | ICD-10-CM

## 2023-09-11 DIAGNOSIS — I10 Essential (primary) hypertension: Secondary | ICD-10-CM | POA: Diagnosis not present

## 2023-09-11 DIAGNOSIS — I25110 Atherosclerotic heart disease of native coronary artery with unstable angina pectoris: Secondary | ICD-10-CM

## 2023-09-11 NOTE — Patient Instructions (Signed)
 Medication Instructions:  Your physician recommends that you continue on your current medications as directed. Please refer to the Current Medication list given to you today.  *If you need a refill on your cardiac medications before your next appointment, please call your pharmacy*  Lab Work: NONE ordered at this time of appointment   Testing/Procedures: NONE ordered at this time of appointment   Follow-Up: At [facility], you and your health needs are our priority.  As part of our continuing mission to provide you with exceptional heart care, our providers are all part of one team.  This team includes your primary Cardiologist (physician) and Advanced Practice Providers or APPs (Physician Assistants and Nurse Practitioners) who all work together to provide you with the care you need, when you need it.  Your next appointment:   3-4 month(s)  Provider:   Lonni Cash, MD or Damien Braver, NP          We recommend signing up for the patient portal called MyChart.  Sign up information is provided on this After Visit Summary.  MyChart is used to connect with patients for Virtual Visits (Telemedicine).  Patients are able to view lab/test results, encounter notes, upcoming appointments, etc.  Non-urgent messages can be sent to your provider as well.   To learn more about what you can do with MyChart, go to [email or web address].

## 2023-09-11 NOTE — Progress Notes (Unsigned)
 Office Visit    Patient Name: Miranda Fuller Date of Encounter: 09/11/2023  Primary Care Provider:  Atilano Deward ORN, MD Primary Cardiologist:  Lonni Cash, MD  Chief Complaint    73 year old female with a history of CAD s/p DES-OM1 in 08/2023, syncope, PSVT, NSVT, hypertension, hyperlipidemia, type 2 diabetes, right breast cancer s/p lumpectomy and radiation, glaucoma, mild dementia, OSA and obesity who presents for hospital follow-up related to CAD s/p DES.  Past Medical History    Past Medical History:  Diagnosis Date   Breast mass 12/27/2018   right- cancer, s/p lumpectomy, radiation   Coccidioidomycosis, pulmonary (HCC)    Complication of anesthesia    DDD (degenerative disc disease), cervical    Dementia (HCC)    Early per Dr. Vonzell   Depression 2009   DM (diabetes mellitus screen) 2009   Family history of bone cancer    Family history of brain cancer    Family history of breast cancer    Family history of esophageal cancer    Family history of lung cancer    Glaucoma    HTN (hypertension)    Morbid obesity (HCC)    Nodule of upper lobe of left lung 12/27/2018   Personal history of radiation therapy    PONV (postoperative nausea and vomiting)    Sleep apnea    had surgery   Type II diabetes mellitus, uncontrolled 01/31/2019   Past Surgical History:  Procedure Laterality Date   ABDOMINAL HYSTERECTOMY     TAH BSO   BREAST BIOPSY Right 02/07/2019   BREAST LUMPECTOMY Right 04/28/2019   BREAST LUMPECTOMY WITH RADIOACTIVE SEED AND SENTINEL LYMPH NODE BIOPSY Left 03/22/2021   Procedure: LEFT BREAST LUMPECTOMY WITH RADIOACTIVE SEED AND SENTINEL LYMPH NODE BIOPSY;  Surgeon: Curvin Deward MOULD, MD;  Location: [facility];  Service: General;  Laterality: Left;   BREAST LUMPECTOMY WITH RADIOACTIVE SEED LOCALIZATION Right 04/28/2019   Procedure: RIGHT BREAST LUMPECTOMY WITH RADIOACTIVE SEED LOCALIZATION;  Surgeon: Curvin Deward MOULD, MD;  Location: [facility];  Service: General;  Laterality: Right;   CHOLECYSTECTOMY     CORONARY ANGIOGRAPHY N/A 08/19/2023   Procedure: CORONARY ANGIOGRAPHY;  Surgeon: Cash Lonni BIRCH, MD;  Location: [facility];  Service: Cardiovascular;  Laterality: N/A;   CORONARY STENT INTERVENTION N/A 08/19/2023   Procedure: CORONARY STENT INTERVENTION;  Surgeon: Cash Lonni BIRCH, MD;  Location: [facility];  Service: Cardiovascular;  Laterality: N/A;   LAPAROSCOPIC GASTRIC SLEEVE RESECTION     TUBAL LIGATION     UVULOPALATOPHARYNGOPLASTY      Allergies  Allergies  Allergen Reactions   Oxycodone-Acetaminophen  Other (See Comments), Itching, Rash and Hives    Other reaction(s): Hallucinations   Propoxyphene Other (See Comments), Hives, Itching and Rash    Other reaction(s): Delusions (intolerance)   Demerol [Meperidine] Other (See Comments)    hallucinations   Morphine And Codeine Hives   Oxycodone-Aspirin  Other (See Comments) and Hives   Chlorhexidine  Rash     Labs/Other Studies Reviewed    The following studies were reviewed today:  Cardiac Studies & Procedures   ______________________________________________________________________________________________ CARDIAC CATHETERIZATION  CARDIAC CATHETERIZATION 08/19/2023  Conclusion   1st Mrg lesion is 99% stenosed.   Mid LAD lesion is 40% stenosed.   A drug-eluting stent was successfully placed using a STENT ONYX FRONTIER 2.25X15.   Post intervention, there is a 0% residual stenosis.  Severe stenosis first obtuse marginal branch Successful PTCA/DES x 1 obtuse marginal branch.  Non-obstructive moderate disease in the mid LAD Small non-dominant RCA  Recommendations: DAPT with ASA and Plavix  for at least six months. Monitor overnight given complicated groin access as described above.  Findings Coronary Findings Diagnostic  Dominance: Left  Left Anterior Descending Vessel is large. Mid LAD lesion is 40%  stenosed.  Left Circumflex  First Obtuse Marginal Branch Vessel is moderate in size. 1st Mrg lesion is 99% stenosed.  Intervention  1st Mrg lesion Stent CATH VISTA GUIDE 6FR XB3 MULPK guide catheter was inserted. Lesion crossed with guidewire using a WIRE HI TORQ WHISPER MS 190CM. A drug-eluting stent was successfully placed using a STENT ONYX FRONTIER 2.25X15. Stent strut is well apposed. Post-stent angioplasty was not performed. Post-Intervention Lesion Assessment The intervention was unsuccessful. Pre-interventional TIMI flow is 3. Post-intervention TIMI flow is 3. No complications occurred at this lesion. There is a 0% residual stenosis post intervention.   STRESS TESTS  EXERCISE TOLERANCE TEST (ETT) 12/29/2014  Narrative  Blood pressure demonstrated a hypotensive response to exercise.  There was no ST segment deviation noted during stress.  ETT with fair exercise tolerance (6:00); no chest pain; hypotensive BP response (SBP documented at 194 stage 1 and 165 stage 2); no ST changes; abnormal ETT due to BP response.   ECHOCARDIOGRAM  ECHOCARDIOGRAM COMPLETE 02/23/2018  Narrative *[facility]* [address] [place], [place] [address] [phone]  ------------------------------------------------------------------- Transthoracic Echocardiography  Patient:    Miranda Fuller MR #:       [id number] Study Date: 02/23/2018 Gender:     F Age:        67 Height:     149.9 cm Weight:     105.7 kg BSA:        2.17 m^2 Pt. Status: Room:  BARTON Miranda Fuller [id number] SONOGRAPHER  Orvil Holmes PERFORMING   [facility], Zelda Salmon ATTENDING    Elnor Lauraine FORBES TISA Elnor Lauraine FORBES REFERRING    Elnor Lauraine E  cc:  ------------------------------------------------------------------- LV EF: 60% -   65%  ------------------------------------------------------------------- Indications:      Chest pain  786.51.  ------------------------------------------------------------------- History:   PMH:  Dementia.  Risk factors:  Hypertension. Diabetes mellitus.  ------------------------------------------------------------------- Study Conclusions  - Left ventricle: The cavity size was normal. Wall thickness was increased in a pattern of mild LVH. Systolic function was normal. The estimated ejection fraction was in the range of 60% to 65%. Wall motion was normal; there were no regional wall motion abnormalities. Features are consistent with a pseudonormal left ventricular filling pattern, with concomitant abnormal relaxation and increased filling pressure (grade 2 diastolic dysfunction). Doppler parameters are consistent with high ventricular filling pressure. - Aortic valve: Trileaflet; mildly thickened, mildly calcified leaflets. Sclerosis without stenosis. - Mitral valve: Mildly calcified annulus. - Left atrium: The atrium was moderately dilated.  ------------------------------------------------------------------- Study data:  No prior study was available for comparison.  Study status:  Routine.  Procedure:  Transthoracic echocardiography. Image quality was adequate.          Transthoracic echocardiography.  M-mode, complete 2D, spectral Doppler, and color Doppler.  Birthdate:  Patient birthdate: 08-20-1950.  Age:  Patient is 73 yr old.  Sex:  Gender: female.    BMI: 47 kg/m^2.  Patient status:  Inpatient.  Study date:  Study date: 02/23/2018. Study time: 08:35 AM.  Location:  Bedside.  -------------------------------------------------------------------  ------------------------------------------------------------------- Left ventricle:  The cavity size was normal. Wall thickness was increased in a pattern  of mild LVH. Systolic function was normal. The estimated ejection fraction was in the range of 60% to 65%. Wall motion was normal; there were no regional wall  motion abnormalities. Features are consistent with a pseudonormal left ventricular filling pattern, with concomitant abnormal relaxation and increased filling pressure (grade 2 diastolic dysfunction). Doppler parameters are consistent with high ventricular filling pressure.  ------------------------------------------------------------------- Aortic valve:   Trileaflet; mildly thickened, mildly calcified leaflets. Sclerosis without stenosis.  Doppler:  There was no regurgitation.  ------------------------------------------------------------------- Aorta:  Aortic root: The aortic root was normal in size.  ------------------------------------------------------------------- Mitral valve:   Mildly calcified annulus.  Doppler:  There was no significant regurgitation.    Peak gradient (D): 6 mm Hg.  ------------------------------------------------------------------- Left atrium:  The atrium was moderately dilated.  ------------------------------------------------------------------- Atrial septum:  No defect or patent foramen ovale was identified.  ------------------------------------------------------------------- Right ventricle:  The cavity size was normal. Wall thickness was normal. Systolic function was normal.  ------------------------------------------------------------------- Pulmonic valve:    The valve appears to be grossly normal. Doppler:  There was no significant regurgitation.  ------------------------------------------------------------------- Tricuspid valve:   The valve appears to be grossly normal. Doppler:  There was trivial regurgitation.  ------------------------------------------------------------------- Right atrium:  The atrium was normal in size.  ------------------------------------------------------------------- Pericardium:  There was no pericardial effusion.  ------------------------------------------------------------------- Systemic veins: Inferior  vena cava: The vessel was normal in size. The respirophasic diameter changes were in the normal range (>= 50%), consistent with normal central venous pressure.  ------------------------------------------------------------------- Measurements  Left ventricle                         Value        Reference LV ID, ED, PLAX chordal        (L)     40.3  mm     43 - 52 LV ID, ES, PLAX chordal                25.7  mm     23 - 38 LV fx shortening, PLAX chordal         36    %      >=29 LV PW thickness, ED                    11.2  mm     ---------- IVS/LV PW ratio, ED                    1.01         <=1.3 LV e&', lateral                         6.53  cm/s   ---------- LV E/e&', lateral                       18.38        ---------- LV e&', medial                          6.31  cm/s   ---------- LV E/e&', medial                        19.02        ---------- LV e&', average  6.42  cm/s   ---------- LV E/e&', average                       18.69        ----------  Ventricular septum                     Value        Reference IVS thickness, ED                      11.3  mm     ----------  LVOT                                   Value        Reference LVOT ID, S                             19    mm     ---------- LVOT area                              2.84  cm^2   ----------  Aorta                                  Value        Reference Aortic root ID, ED                     27    mm     ----------  Left atrium                            Value        Reference LA ID, A-P, ES                         42    mm     ---------- LA ID/bsa, A-P                         1.94  cm/m^2 <=2.2 LA volume, S                           73.4  ml     ---------- LA volume/bsa, S                       33.9  ml/m^2 ---------- LA volume, ES, 1-p A4C                 87.9  ml     ---------- LA volume/bsa, ES, 1-p A4C             40.6  ml/m^2 ---------- LA volume, ES, 1-p A2C                 55.4  ml      ---------- LA volume/bsa, ES, 1-p A2C             25.6  ml/m^2 ----------  Mitral valve  Value        Reference Mitral E-wave peak velocity            120   cm/s   ---------- Mitral A-wave peak velocity            99.4  cm/s   ---------- Mitral deceleration time       (H)     345   ms     150 - 230 Mitral peak gradient, D                6     mm Hg  ---------- Mitral E/A ratio, peak                 1.2          ----------  Right atrium                           Value        Reference RA ID, S-I, ES, A4C                    42.3  mm     34 - 49 RA area, ES, A4C                       11.6  cm^2   8.3 - 19.5 RA volume, ES, A/L                     27.9  ml     ---------- RA volume/bsa, ES, A/L                 12.9  ml/m^2 ----------  Systemic veins                         Value        Reference Estimated CVP                          3     mm Hg  ----------  Right ventricle                        Value        Reference TAPSE                                  26.7  mm     ---------- RV s&', lateral, S                      14.1  cm/s   ----------  Legend: (L)  and  (H)  mark values outside specified reference range.  ------------------------------------------------------------------- Prepared and Electronically Authenticated by  Pearla Rout, MD 2019-12-10T11:36:47    MONITORS  LONG TERM MONITOR (3-14 DAYS) 07/16/2023  Narrative Patch Wear Time:  13 days and 23 hours (2025-04-07T16:12:13-0400 to 2025-04-21T16:12:07-398)  Sinus rhythm. (min HR of 64 bpm, max HR of 174 bpm, and avg HR of 83 bpm). 2 Ventricular Tachycardia runs occurred, the longest was 8 beats with a max rate of 174 bpm. 16 Supraventricular Tachycardia runs occurred, the longest was 21.4 seconds. Isolated SVEs were rare (<1.0%), SVE Couplets were rare (<1.0%), and SVE Triplets were rare (<1.0%). Isolated VEs were rare (<1.0%), VE Couplets were rare (<  1.0%), and no VE Triplets were  present.   CT SCANS  CT CORONARY MORPH W/CTA COR W/SCORE 07/14/2023  Addendum 07/28/2023  5:50 PM ADDENDUM REPORT: 07/28/2023 17:48  EXAM: OVER-READ INTERPRETATION  CT CHEST  The following report is an over-read performed by radiologist Dr. Andrea Gasman of [facility] on 07/28/2023. This over-read does not include interpretation of cardiac or coronary anatomy or pathology. The coronary CTA interpretation by the cardiologist is attached.  COMPARISON:  Cardiac CT 05/31/2018  FINDINGS: Vascular: Mild aortic atherosclerosis. The included aorta is normal in caliber.  Mediastinum/nodes: No adenopathy or mass. Unremarkable esophagus.  Lungs: No focal airspace disease. Subpleural reticular changes within the anterior right lung typical of post radiation change. 2No pulmonary nodule. No pleural fluid. The included airways are patent.  Upper abdomen: Postsurgical change in the proximal stomach with gastric suture line crossing the diaphragmatic hiatus. Minimal hiatal hernia.  Musculoskeletal: There are no acute or suspicious osseous abnormalities.  IMPRESSION: 1. No acute findings in the chest. 2. Postsurgical change in the proximal stomach with minimal hiatal hernia.  Aortic Atherosclerosis (ICD10-I70.0).   Electronically Signed By: Andrea Gasman M.D. On: 07/28/2023 17:48  Narrative CLINICAL DATA:  Chest pain  EXAM: Cardiac/Coronary CTA  TECHNIQUE: A non-contrast, gated CT scan was obtained with axial slices of 2.5 mm through the heart for calcium scoring. Calcium scoring was performed using the Agatston method. A 120 kV prospective, gated, contrast cardiac CT scan was obtained. Gantry rotation speed was 230 msec and collimation was 0.63 mm. Two sublingual nitroglycerin  tablets (0.8 mg) were given. The 3D data set was reconstructed with motion correction for the best systolic or diastolic phase. Images were analyzed on a dedicated workstation  using MPR, MIP, and VRT modes. The patient received 95 cc of contrast.  FINDINGS: Image quality: Good  Noise artifact is: Limited.  Coronary Arteries:  Normal coronary origin.  Left dominance.  Left main: The left main is a large caliber vessel with a normal take off from the left coronary cusp that bifurcates to form a left anterior descending artery and a left circumflex artery. There is no plaque or stenosis.  Left anterior descending artery: The LAD is patent. The LAD gives off 2 patent diagonal branches. Calcified plaque in proximal LAD causes 0-24% stenosis. Calcified plaque in mid LAD causes 50-69% stenosis. Calcified plaque in distal LAD causes 25-49% stenosis. Noncalcified plaque in D3 causes 50-69% stenosis  Left circumflex artery: The LCX is dominant and patent. The LCX gives off 2 patent obtuse marginal branches. Calcified plaque in proximal LCX causes 0-24% stenosis. Calcified plaque in mid LCX causes 0-24% stenosis. Calcified plaque in distal LCX causes 0-24% stenosis. Mixed plaque in proximal OM1 causes severe (70-99%) stenosis, but small vessel (<59mm)  Right coronary artery: The RCA is nondominant with normal take off from the right coronary cusp. Calcified plaque in proximal RCA causes 25-49% stenosis  Right Atrium: Right atrial size is within normal limits.  Right Ventricle: The right ventricular cavity is within normal limits.  Left Atrium: Left atrial size is mildly enlarged with no left atrial appendage filling defect.  Left Ventricle: The ventricular cavity size is within normal limits.  Pulmonary arteries: Normal in size.  Pulmonary veins: Normal pulmonary venous drainage.  Pericardium: Normal thickness without significant effusion or calcium present.  Cardiac valves: The aortic valve is trileaflet with moderate calcifications (AV calcium score 676). The mitral valve has moderate MAC  Aorta: Normal caliber without significant  disease.  Extra-cardiac  findings: See attached radiology report for non-cardiac structures.  IMPRESSION: 1. Coronary calcium score of 823. This was 95th percentile for age-, sex, and race-matched controls.  2. Total plaque volume 58mm3 which is 74th percentile for age- and sex-matched controls (calcified plaque 186mm3; non-calcified plaque 397mm3). TPV is severe  3.  Normal coronary origin with left dominance.  4. Severe (70-99%) stenosis in proximal OM1, but small vessel (<63mm)  5.  Moderate (50-69%) stenosis in mid LAD and D3  6.  Mild (25-49%) stenosis in distal LAD and proximal RCA  7. Minimal (0-24%) stenosis in proximal LAD, proximal/mid/distal LCX, and proximal RCA  8. The aortic valve is trileaflet with moderate calcifications (AV calcium score 676).  9.  Moderate mitral annular calcifications  10. Will send study for CTFFR  RECOMMENDATIONS: CAD-RADS 4: Severe stenosis. (70-99% or > 50% left main). CT FFR is recommended. Consider symptom-guided anti-ischemic pharmacotherapy as well as risk factor modification per guideline directed care.  Electronically Signed: By: Lonni Nanas M.D. On: 07/14/2023 14:22   CT SCANS  CT CORONARY FRACTIONAL FLOW RESERVE DATA PREP 06/03/2018  Narrative CLINICAL DATA:  Chest pain  EXAM: CT FFR  MEDICATIONS: No additional medications  TECHNIQUE: The coronary CTA was sent for FFR.  FINDINGS: FFR mid LAD 0.86  FFR OM1 0.72  IMPRESSION: There is hemodynamically significant stenosis in the proximal OM1. From the CTA, this appeared to be a relatively small caliber vessel.  Dalton Mclean   Electronically Signed By: Ezra Shuck M.D. On: 06/04/2018 11:15   CT CORONARY MORPH W/CTA COR W/SCORE 05/31/2018  Addendum 06/03/2018  1:26 PM ADDENDUM REPORT: 06/03/2018 13:24  CLINICAL DATA:  Chest pain  EXAM: Cardiac CTA  MEDICATIONS: Sub lingual nitro. 4mg  x 2  TECHNIQUE: The patient was scanned on a  Siemens 192 slice scanner. Gantry rotation speed was 250 msecs. Collimation was 0.6 mm. A 100 kV prospective scan was triggered in the ascending thoracic aorta at 35-75% of the R-R interval. Average HR during the scan was 60 bpm. The 3D data set was interpreted on a dedicated work station using MPR, MIP and VRT modes. A total of 80cc of contrast was used.  FINDINGS: Non-cardiac: See separate report from [facility].  Pulmonary veins drain normally to the left atrium.  Calcium Score: 304 Agatston units.  Coronary Arteries: Left dominant with no anomalies  LM: No plaque or stenosis.  LAD system: Mixed plaque proximal LAD, suspect up to 50% stenosis.  Circumflex system: Mixed plaque proximal LCx with mild (<50%) stenosis. Mixed plaque distal LCx with minimal stenosis. Small OM1 with motion artifact noted, mixed plaque proximally, possible moderate (50-69%) stenosis.  RCA system: Small, nondominant RCA. Interpretation complicated by motion artifact.  IMPRESSION: 1. Coronary artery calcium score 304 Agatston units. This places the patient in the 91st percentile for age and gender, suggesting high risk for future cardiac events.  2. Suspect 50% proximal LAD stenosis. Possible moderate stenosis in OM1 but the vessel is small and affected by motion artifact.  Will send for FFR.  Dalton Mclean   Electronically Signed By: Ezra Shuck M.D. On: 06/03/2018 13:24  Narrative EXAM: OVER-READ INTERPRETATION  CT CHEST  The following report is an over-read performed by radiologist Dr. Toribio Aye of [facility] on 05/31/2018. This over-read does not include interpretation of cardiac or coronary anatomy or pathology. The coronary calcium score/coronary CTA interpretation by the cardiologist is attached.  COMPARISON:  None.  FINDINGS: Aortic atherosclerosis. Within the visualized portions of  the thorax there are no suspicious appearing pulmonary  nodules or masses, there is no acute consolidative airspace disease, no pleural effusions, no pneumothorax and no lymphadenopathy. Visualized portions of the upper abdomen demonstrates postoperative changes in the stomach, incompletely imaged. There are no aggressive appearing lytic or blastic lesions noted in the visualized portions of the skeleton.  IMPRESSION: 1.  Aortic Atherosclerosis (ICD10-I70.0).  Electronically Signed: By: Toribio Aye M.D. On: 05/31/2018 13:31     ______________________________________________________________________________________________     Recent Labs: 08/20/2023: BUN 20; Creatinine, Ser 0.96; Hemoglobin 11.6; Platelets 247; Potassium 4.0; Sodium 138  Recent Lipid Panel    Component Value Date/Time   CHOL  01/30/2010 0305    184        ATP III CLASSIFICATION:  <200     mg/dL   Desirable  799-760  mg/dL   Borderline High  >=759    mg/dL   High          TRIG 691 (H) 01/30/2010 0305   HDL 44 01/30/2010 0305   CHOLHDL 4.2 01/30/2010 0305   VLDL 62 (H) 01/30/2010 0305   LDLCALC  01/30/2010 0305    78        Total Cholesterol/HDL:CHD Risk Coronary Heart Disease Risk Table                     Men   Women  1/2 Average Risk   3.4   3.3  Average Risk       5.0   4.4  2 X Average Risk   9.6   7.1  3 X Average Risk  23.4   11.0        Use the calculated Patient Ratio above and the CHD Risk Table to determine the patient's CHD Risk.        ATP III CLASSIFICATION (LDL):  <100     mg/dL   Optimal  899-870  mg/dL   Near or Above                    Optimal  130-159  mg/dL   Borderline  839-810  mg/dL   High  >809     mg/dL   Very High    History of Present Illness    73 year old female with the above past medical history including CAD s/p DES-OM1 in 08/2023, syncope, PSVT, NSVT, hypertension, hyperlipidemia, type 2 diabetes, right breast cancer s/p lumpectomy and radiation, glaucoma, mild dementia, OSA and obesity.  ETT in 2016 showed no  ischemic changes.  Echocardiogram in 02/2018 showed EF 60 to 65%, sclerotic aortic valve with no evidence of aortic valve stenosis.  Coronary CT angiogram in 05/2018 revealed coronary calcium score of 304 (91st percentile), FFR showed hemodynamically significant stenosis in the proximal OM1, given relatively small vessel, medical management was recommended.  Repeat coronary CTA in 06/2023 in the setting of of exertional chest pain, syncope, revealed coronary calcium score of 823 (95th percentile), severe TPV, severe stenosis in the proximal OM1. Cardiac monitor 07/2023 revealed predominantly sinus rhythm, 2 runs of NSVT, longest lasting 8 beats, 16 runs of PSVT, longest lasting 21.4 seconds, rare PACs and PVCs.  She was started on metoprolol .  She was last seen in the office on 08/03/2023 and reported intermittent chest pain with exertion.  She denied recurrent syncope.  Outpatient cardiac catheterization on 08/19/2023 revealed 90% OM1 stenosis s/p DES, 40% mid LAD stenosis, small nondominant RCA.  She was started on DAPT with aspirin   and Plavix .  She presents today for follow-up. Since her procedure she has been stable from cardiac standpoint.  She notes fleeting chest discomfort at rest, which she describes as a stabbing pain, lasts less than 1 minute, with associated shortness of breath.  It was resolved spontaneously.  She has not taken nitroglycerin . She reports occasional mild dyspnea on exertion, dependent nonpitting bilateral lower extremity edema.  She denies palpitations, dizziness, PND, orthopnea, weight gain.    Home Medications    Current Outpatient Medications  Medication Sig Dispense Refill   acetaminophen  (TYLENOL ) 325 MG tablet Take 500 mg by mouth every 6 (six) hours as needed.     anastrozole  (ARIMIDEX ) 1 MG tablet Take 1 tablet (1 mg total) by mouth daily. 45 tablet 3   aspirin  81 MG tablet Take 81 mg by mouth daily.     calcium carbonate (OS-CAL) 1250 (500 Ca) MG chewable tablet Chew 1  tablet by mouth daily.     Calcium Carbonate-Vit D-Min (CALCIUM 1200 PO) Take 1,200 mg by mouth 2 (two) times daily.     Cholecalciferol (VITAMIN D -3) 25 MCG (1000 UT) CAPS Take 3,000 Units by mouth daily.     clopidogrel  (PLAVIX ) 75 MG tablet Take 1 tablet (75 mg total) by mouth daily with breakfast. 90 tablet 2   cyanocobalamin  (VITAMIN B12) 1000 MCG tablet Take 1,000 mcg by mouth 2 (two) times daily.     ezetimibe  (ZETIA ) 10 MG tablet Take 10 mg by mouth daily.     insulin  aspart (NOVOLOG ) 100 UNIT/ML injection Inject 5-15 Units into the skin 3 (three) times daily with meals. 5-15 units on sliding scale      insulin  glargine (LANTUS ) 100 UNIT/ML injection Inject 45 Units into the skin at bedtime.     lisinopril -hydrochlorothiazide  (ZESTORETIC ) 10-12.5 MG tablet Take 1 tablet by mouth daily.     metoprolol  succinate (TOPROL  XL) 25 MG 24 hr tablet Take 1 tablet (25 mg total) by mouth daily. 30 tablet 2   Multiple Vitamins-Minerals (MULTIVITAMIN ADULT PO) Take 1 tablet by mouth daily.     nitroGLYCERIN  (NITROSTAT ) 0.4 MG SL tablet Place 1 tablet (0.4 mg total) under the tongue every 5 (five) minutes as needed. 25 tablet 2   Semaglutide , 1 MG/DOSE, (OZEMPIC , 1 MG/DOSE,) 4 MG/3ML SOPN Inject 1 mg into the skin once a week.     venlafaxine  XR (EFFEXOR -XR) 150 MG 24 hr capsule Take 150 mg by mouth daily.     No current facility-administered medications for this visit.     Review of Systems    She denies palpitations, pnd, orthopnea, n, v, dizziness, syncope, weight gain, or early satiety. All other systems reviewed and are otherwise negative except as noted above.     Cardiac Rehabilitation Eligibility Assessment  The patient is ready to start cardiac rehabilitation from a cardiac standpoint.    Physical Exam    VS:  BP 130/68   Pulse 86   Ht 4' 11 (1.499 m)   Wt 200 lb 6.4 oz (90.9 kg)   SpO2 97%   BMI 40.48 kg/m   GEN: Well nourished, well developed, in no acute distress. HEENT:  normal. Neck: Supple, no JVD, carotid bruits, or masses. Cardiac: RRR, no murmurs, rubs, or gallops. No clubbing, cyanosis, trace nonpitting bilateral lower extremity edema.  Radials/DP/PT 2+ and equal bilaterally.  Respiratory:  Respirations regular and unlabored, clear to auscultation bilaterally. Right radial cath site without bruising, bleeding, bruit or hematoma.  Right groin site without bruising,  bleeding, bruit, hematoma. GI: Soft, nontender, nondistended, BS + x 4. MS: no deformity or atrophy. Skin: warm and dry, no rash. Neuro:  Strength and sensation are intact. Psych: Normal affect.  Accessory Clinical Findings    ECG personally reviewed by me today - EKG Interpretation Date/Time:  Friday September 11 2023 15:43:18 EDT Ventricular Rate:  86 PR Interval:  174 QRS Duration:  82 QT Interval:  376 QTC Calculation: 449 R Axis:   -40  Text Interpretation: Normal sinus rhythm Left axis deviation When compared with ECG of 20-Aug-2023 07:12, PR interval has decreased Confirmed by Daneen Perkins ([id number]) on 09/11/2023 3:45:49 PM  - no acute changes.   Lab Results  Component Value Date   WBC 7.9 08/20/2023   HGB 11.6 (L) 08/20/2023   HCT 35.2 (L) 08/20/2023   MCV 95.4 08/20/2023   PLT 247 08/20/2023   Lab Results  Component Value Date   CREATININE 0.96 08/20/2023   BUN 20 08/20/2023   NA 138 08/20/2023   K 4.0 08/20/2023   CL 106 08/20/2023   CO2 22 08/20/2023   Lab Results  Component Value Date   ALT 10 10/23/2021   AST 13 (L) 10/23/2021   ALKPHOS 76 10/23/2021   BILITOT 0.5 10/23/2021   Lab Results  Component Value Date   CHOL  01/30/2010    184        ATP III CLASSIFICATION:  <200     mg/dL   Desirable  799-760  mg/dL   Borderline High  >=759    mg/dL   High          HDL 44 01/30/2010   LDLCALC  01/30/2010    78        Total Cholesterol/HDL:CHD Risk Coronary Heart Disease Risk Table                     Men   Women  1/2 Average Risk   3.4   3.3  Average Risk        5.0   4.4  2 X Average Risk   9.6   7.1  3 X Average Risk  23.4   11.0        Use the calculated Patient Ratio above and the CHD Risk Table to determine the patient's CHD Risk.        ATP III CLASSIFICATION (LDL):  <100     mg/dL   Optimal  899-870  mg/dL   Near or Above                    Optimal  130-159  mg/dL   Borderline  839-810  mg/dL   High  >809     mg/dL   Very High   TRIG 691 (H) 01/30/2010   CHOLHDL 4.2 01/30/2010    Lab Results  Component Value Date   HGBA1C (H) 01/29/2010    11.7 (NOTE)                                                                       According to the ADA Clinical Practice Recommendations for 2011, when HbA1c is used as a screening test:   >=6.5%   Diagnostic of Diabetes Mellitus           (  if abnormal result  is confirmed)  5.7-6.4%   Increased risk of developing Diabetes Mellitus  References:Diagnosis and Classification of Diabetes Mellitus,Diabetes Care,2011,34(Suppl 1):S62-S69 and Standards of Medical Care in         Diabetes - 2011,Diabetes Care,2011,34  (Suppl 1):S11-S61.    Assessment & Plan   1. CAD: Outpatient cardiac catheterization on 08/19/2023 revealed 90% OM1 stenosis s/p DES, 40% mid LAD stenosis, small nondominant RCA. She notes intermittent fleeting chest discomfort at rest, which she describes as a stabbing pain, lasts less than 1 minute, with associated shortness of breath.  It was resolved spontaneously.  She has not taken nitroglycerin . She reports occasional mild dyspnea on exertion, dependent nonpitting bilateral lower extremity edema.  Euvolemic and well compensated on exam. We discussed possible addition of Imdur , she declines today.  Reviewed ED precautions.  Continue to monitor symptoms.  Echocardiogram pending.  Continue aspirin , Plavix , metoprolol , Zetia .  She plans to start Repatha as below.  She is interested in cardiac rehab.  2. History of syncope: No recurrence. Cardiac monitor 07/2023 revealed predominantly sinus  rhythm, 2 runs of NSVT, longest lasting 8 beats, 16 runs of PSVT, longest lasting 21.4 seconds, rare PACs and PVCs.  Echo pending.  Reviewed ED precautions.  3. PSVT/NSVT: Cardiac monitor 07/2023 revealed predominantly sinus rhythm, 2 runs of NSVT, longest lasting 8 beats, 16 runs of PSVT, longest lasting 21.4 seconds, rare PACs and PVCs.  Denies palpitations.  Stable on metoprolol .  4. Hypertension: BP actually elevated in office today, improved with recheck.  Continue current antihypertensive regimen.   5. Hyperlipidemia: No recent LDL on file.  She is statin intolerant.  She plans to start Repatha per PCP.  6. Type 2 diabetes/obesity: A1c was 7.0 in 02/2023.  Monitored and managed per PCP.  7. OSA: Not on CPAP.   8. Disposition: Follow-up in 3 to 4 months with Dr. Verlin, sooner if needed.        Damien JAYSON Braver, NP 09/11/2023, 4:16 PM

## 2023-09-13 ENCOUNTER — Encounter: Payer: Self-pay | Admitting: Nurse Practitioner

## 2023-09-14 DIAGNOSIS — E1122 Type 2 diabetes mellitus with diabetic chronic kidney disease: Secondary | ICD-10-CM | POA: Diagnosis not present

## 2023-09-14 DIAGNOSIS — E782 Mixed hyperlipidemia: Secondary | ICD-10-CM | POA: Diagnosis not present

## 2023-09-16 ENCOUNTER — Encounter (HOSPITAL_COMMUNITY)

## 2023-09-22 ENCOUNTER — Encounter (HOSPITAL_COMMUNITY)

## 2023-09-30 ENCOUNTER — Encounter (HOSPITAL_COMMUNITY)

## 2023-10-08 ENCOUNTER — Ambulatory Visit (HOSPITAL_COMMUNITY): Attending: Cardiovascular Disease

## 2023-10-14 ENCOUNTER — Encounter (HOSPITAL_COMMUNITY): Payer: Self-pay | Admitting: Cardiovascular Disease

## 2023-10-14 ENCOUNTER — Encounter: Payer: Self-pay | Admitting: *Deleted

## 2023-10-15 DIAGNOSIS — E782 Mixed hyperlipidemia: Secondary | ICD-10-CM | POA: Diagnosis not present

## 2023-10-15 DIAGNOSIS — E1122 Type 2 diabetes mellitus with diabetic chronic kidney disease: Secondary | ICD-10-CM | POA: Diagnosis not present

## 2023-10-29 DIAGNOSIS — I10 Essential (primary) hypertension: Secondary | ICD-10-CM | POA: Diagnosis not present

## 2023-10-29 DIAGNOSIS — R5383 Other fatigue: Secondary | ICD-10-CM | POA: Diagnosis not present

## 2023-10-29 DIAGNOSIS — E7849 Other hyperlipidemia: Secondary | ICD-10-CM | POA: Diagnosis not present

## 2023-10-29 DIAGNOSIS — E1122 Type 2 diabetes mellitus with diabetic chronic kidney disease: Secondary | ICD-10-CM | POA: Diagnosis not present

## 2023-10-29 DIAGNOSIS — Z0001 Encounter for general adult medical examination with abnormal findings: Secondary | ICD-10-CM | POA: Diagnosis not present

## 2023-11-03 ENCOUNTER — Encounter (HOSPITAL_COMMUNITY)
Admission: RE | Admit: 2023-11-03 | Discharge: 2023-11-03 | Disposition: A | Discharge status: Home / Self Care | Source: Ambulatory Visit | Attending: Cardiovascular Disease | Admitting: Cardiovascular Disease

## 2023-11-03 VITALS — Ht 60.0 in | Wt 201.9 lb

## 2023-11-03 DIAGNOSIS — Z903 Acquired absence of stomach [part of]: Secondary | ICD-10-CM | POA: Insufficient documentation

## 2023-11-03 DIAGNOSIS — Z955 Presence of coronary angioplasty implant and graft: Secondary | ICD-10-CM | POA: Diagnosis not present

## 2023-11-03 NOTE — Patient Instructions (Addendum)
 Patient Instructions  Patient Details  Name: Miranda Fuller MRN: [id number] Date of Birth: [birth year] Referring Provider:  Verlin Lonni BIRCH*  Below are your personal goals for exercise, nutrition, and risk factors. Our goal is to help you stay on track towards obtaining and maintaining these goals. We will be discussing your progress on these goals with you throughout the program.  Initial Exercise Prescription:  Initial Exercise Prescription - 11/03/23 1000       Date of Initial Exercise RX and Referring Provider   Date 11/03/23    Referring Provider Verlin Lonni MD      Oxygen   Maintain Oxygen Saturation 88% or higher      Treadmill   MPH 1.2    Grade 0.5    Minutes 15    METs 2      NuStep   Level 1    SPM 80    Minutes 15    METs 2      Prescription Details   Frequency (times per week) 3    Duration Progress to 30 minutes of continuous aerobic without signs/symptoms of physical distress      Intensity   THRR 40-80% of Max Heartrate 103-134    Ratings of Perceived Exertion 11-13    Perceived Dyspnea 0-4      Progression   Progression Continue to progress workloads to maintain intensity without signs/symptoms of physical distress.      Resistance Training   Training Prescription Yes    Weight 3 lb    Reps 10-15          Exercise Goals: Frequency: Be able to perform aerobic exercise two to three times per week in program working toward 2-5 days per week of home exercise.  Intensity: Work with a perceived exertion of 11 (fairly light) - 15 (hard) while following your exercise prescription.  We will make changes to your prescription with you as you progress through the program.   Duration: Be able to do 30 to 45 minutes of continuous aerobic exercise in addition to a 5 minute warm-up and a 5 minute cool-down routine.   Nutrition Goals: Your personal nutrition goals will be established when you do your nutrition analysis with the  dietician.  The following are general nutrition guidelines to follow: Cholesterol < 200mg /day Sodium < 1500mg /day Fiber: Women over 50 yrs - 21 grams per day  Personal Goals:  Personal Goals and Risk Factors at Admission - 11/03/23 1015       Core Components/Risk Factors/Patient Goals on Admission    Weight Management Yes;Obesity;Weight Loss    Intervention Weight Management: Develop a combined nutrition and exercise program designed to reach desired caloric intake, while maintaining appropriate intake of nutrient and fiber, sodium and fats, and appropriate energy expenditure required for the weight goal.;Weight Management: Provide education and appropriate resources to help participant work on and attain dietary goals.;Weight Management/Obesity: Establish reasonable short term and long term weight goals.;Obesity: Provide education and appropriate resources to help participant work on and attain dietary goals.    Admit Weight 201 lb 14.4 oz (91.6 kg)    Goal Weight: Short Term 195 lb (88.5 kg)    Goal Weight: Long Term 190 lb (86.2 kg)    Expected Outcomes Short Term: Continue to assess and modify interventions until short term weight is achieved;Long Term: Adherence to nutrition and physical activity/exercise program aimed toward attainment of established weight goal;Weight Loss: Understanding of general recommendations for a balanced deficit meal plan, which  promotes 1-2 lb weight loss per week and includes a negative energy balance of [phone] kcal/d;Understanding recommendations for meals to include 15-35% energy as protein, 25-35% energy from fat, 35-60% energy from carbohydrates, less than 200mg  of dietary cholesterol, 20-35 gm of total fiber daily;Understanding of distribution of calorie intake throughout the day with the consumption of 4-5 meals/snacks    Improve shortness of breath with ADL's Yes    Intervention Provide education, individualized exercise plan and daily activity  instruction to help decrease symptoms of SOB with activities of daily living.    Expected Outcomes Short Term: Improve cardiorespiratory fitness to achieve a reduction of symptoms when performing ADLs;Long Term: Be able to perform more ADLs without symptoms or delay the onset of symptoms    Diabetes Yes    Intervention Provide education about signs/symptoms and action to take for hypo/hyperglycemia.;Provide education about proper nutrition, including hydration, and aerobic/resistive exercise prescription along with prescribed medications to achieve blood glucose in normal ranges: Fasting glucose 65-99 mg/dL    Expected Outcomes Short Term: Participant verbalizes understanding of the signs/symptoms and immediate care of hyper/hypoglycemia, proper foot care and importance of medication, aerobic/resistive exercise and nutrition plan for blood glucose control.;Long Term: Attainment of HbA1C < 7%.    Hypertension Yes    Intervention Provide education on lifestyle modifcations including regular physical activity/exercise, weight management, moderate sodium restriction and increased consumption of fresh fruit, vegetables, and low fat dairy, alcohol moderation, and smoking cessation.;Monitor prescription use compliance.    Expected Outcomes Short Term: Continued assessment and intervention until BP is < 140/89mm HG in hypertensive participants. < 130/93mm HG in hypertensive participants with diabetes, heart failure or chronic kidney disease.;Long Term: Maintenance of blood pressure at goal levels.    Lipids Yes    Intervention Provide education and support for participant on nutrition & aerobic/resistive exercise along with prescribed medications to achieve LDL 70mg , HDL >40mg .    Expected Outcomes Long Term: Cholesterol controlled with medications as prescribed, with individualized exercise RX and with personalized nutrition plan. Value goals: LDL < 70mg , HDL > 40 mg.;Short Term: Participant states understanding  of desired cholesterol values and is compliant with medications prescribed. Participant is following exercise prescription and nutrition guidelines.          Tobacco Use Initial Evaluation: Social History   Tobacco Use  Smoking Status Never  Smokeless Tobacco Never    Exercise Goals and Review:  Exercise Goals     Row Name 11/03/23 1007             Exercise Goals   Increase Physical Activity Yes       Intervention Provide advice, education, support and counseling about physical activity/exercise needs.;Develop an individualized exercise prescription for aerobic and resistive training based on initial evaluation findings, risk stratification, comorbidities and participant's personal goals.       Expected Outcomes Short Term: Attend rehab on a regular basis to increase amount of physical activity.;Long Term: Exercising regularly at least 3-5 days a week.;Long Term: Add in home exercise to make exercise part of routine and to increase amount of physical activity.       Increase Strength and Stamina Yes       Intervention Provide advice, education, support and counseling about physical activity/exercise needs.;Develop an individualized exercise prescription for aerobic and resistive training based on initial evaluation findings, risk stratification, comorbidities and participant's personal goals.       Expected Outcomes Short Term: Increase workloads from initial exercise prescription for resistance, speed,  and METs.;Short Term: Perform resistance training exercises routinely during rehab and add in resistance training at home;Long Term: Improve cardiorespiratory fitness, muscular endurance and strength as measured by increased METs and functional capacity ( )       Able to understand and use rate of perceived exertion (RPE) scale Yes       Intervention Provide education and explanation on how to use RPE scale       Expected Outcomes Short Term: Able to use RPE daily in rehab to  express subjective intensity level;Long Term:  Able to use RPE to guide intensity level when exercising independently       Able to understand and use Dyspnea scale Yes       Intervention Provide education and explanation on how to use Dyspnea scale       Expected Outcomes Short Term: Able to use Dyspnea scale daily in rehab to express subjective sense of shortness of breath during exertion;Long Term: Able to use Dyspnea scale to guide intensity level when exercising independently       Knowledge and understanding of Target Heart Rate Range (THRR) Yes       Intervention Provide education and explanation of THRR including how the numbers were predicted and where they are located for reference       Expected Outcomes Short Term: Able to state/look up THRR;Long Term: Able to use THRR to govern intensity when exercising independently;Short Term: Able to use daily as guideline for intensity in rehab       Able to check pulse independently Yes       Intervention Provide education and demonstration on how to check pulse in carotid and radial arteries.;Review the importance of being able to check your own pulse for safety during independent exercise       Expected Outcomes Short Term: Able to explain why pulse checking is important during independent exercise;Long Term: Able to check pulse independently and accurately       Understanding of Exercise Prescription Yes       Intervention Provide education, explanation, and written materials on patient's individual exercise prescription       Expected Outcomes Short Term: Able to explain program exercise prescription;Long Term: Able to explain home exercise prescription to exercise independently        Copy of goals given to participant.

## 2023-11-03 NOTE — Progress Notes (Signed)
 Cardiac Individual Treatment Plan  Patient Details  Name: Miranda Fuller MRN: [id number] Date of Birth: Jul 31, 1950 Referring Provider:   Flowsheet Row CARDIAC REHAB PHASE II ORIENTATION from 11/03/2023 in [facility]  Referring Provider Verlin Bruckner MD    Initial Encounter Date:  Flowsheet Row CARDIAC REHAB PHASE II ORIENTATION from 11/03/2023 in [facility]  Date 11/03/23    Visit Diagnosis: Status post coronary artery stent placement  Patient's Home Medications on Admission:  Current Outpatient Medications:    acetaminophen  (TYLENOL ) 325 MG tablet, Take 500 mg by mouth every 6 (six) hours as needed., Disp: , Rfl:    anastrozole  (ARIMIDEX ) 1 MG tablet, Take 1 tablet (1 mg total) by mouth daily., Disp: 45 tablet, Rfl: 3   aspirin  81 MG tablet, Take 81 mg by mouth daily., Disp: , Rfl:    calcium carbonate (OS-CAL) 1250 (500 Ca) MG chewable tablet, Chew 1 tablet by mouth daily., Disp: , Rfl:    Calcium Carbonate-Vit D-Min (CALCIUM 1200 PO), Take 1,200 mg by mouth 2 (two) times daily., Disp: , Rfl:    Cholecalciferol (VITAMIN D -3) 25 MCG (1000 UT) CAPS, Take 3,000 Units by mouth daily., Disp: , Rfl:    clopidogrel  (PLAVIX ) 75 MG tablet, Take 1 tablet (75 mg total) by mouth daily with breakfast., Disp: 90 tablet, Rfl: 2   cyanocobalamin  (VITAMIN B12) 1000 MCG tablet, Take 1,000 mcg by mouth 2 (two) times daily., Disp: , Rfl:    ezetimibe  (ZETIA ) 10 MG tablet, Take 10 mg by mouth daily., Disp: , Rfl:    insulin  aspart (NOVOLOG ) 100 UNIT/ML injection, Inject 5-15 Units into the skin 3 (three) times daily with meals. 5-15 units on sliding scale , Disp: , Rfl:    insulin  glargine (LANTUS ) 100 UNIT/ML injection, Inject 45 Units into the skin at bedtime., Disp: , Rfl:    lisinopril -hydrochlorothiazide  (ZESTORETIC ) 10-12.5 MG tablet, Take 1 tablet by mouth daily., Disp: , Rfl:    metoprolol  succinate (TOPROL  XL) 25 MG 24 hr tablet, Take 1 tablet  (25 mg total) by mouth daily., Disp: 30 tablet, Rfl: 2   Multiple Vitamins-Minerals (MULTIVITAMIN ADULT PO), Take 1 tablet by mouth daily., Disp: , Rfl:    nitroGLYCERIN  (NITROSTAT ) 0.4 MG SL tablet, Place 1 tablet (0.4 mg total) under the tongue every 5 (five) minutes as needed., Disp: 25 tablet, Rfl: 2   Semaglutide , 1 MG/DOSE, (OZEMPIC , 1 MG/DOSE,) 4 MG/3ML SOPN, Inject 1 mg into the skin once a week., Disp: , Rfl:    venlafaxine  XR (EFFEXOR -XR) 150 MG 24 hr capsule, Take 150 mg by mouth daily., Disp: , Rfl:   Past Medical History: Past Medical History:  Diagnosis Date   Breast mass 12/27/2018   right- cancer, s/p lumpectomy, radiation   Coccidioidomycosis, pulmonary (HCC)    Complication of anesthesia    DDD (degenerative disc disease), cervical    Dementia (HCC)    Early per Dr. Vonzell   Depression 2009   DM (diabetes mellitus screen) 2009   Family history of bone cancer    Family history of brain cancer    Family history of breast cancer    Family history of esophageal cancer    Family history of lung cancer    Glaucoma    HTN (hypertension)    Morbid obesity (HCC)    Nodule of upper lobe of left lung 12/27/2018   Personal history of radiation therapy    PONV (postoperative nausea and vomiting)    Sleep  apnea    had surgery   Type II diabetes mellitus, uncontrolled 01/31/2019    Tobacco Use: Social History   Tobacco Use  Smoking Status Never  Smokeless Tobacco Never    Labs: Review Flowsheet       Latest Ref Rng & Units 01/29/2010 01/30/2010  Labs for ITP Cardiac and Pulmonary Rehab  Cholestrol 0 - 200 mg/dL - 815        ATP III CLASSIFICATION:  <200     mg/dL   Desirable  799-760  mg/dL   Borderline High  >=759    mg/dL   High          LDL (calc) 0 - 99 mg/dL - 78        Total Cholesterol/HDL:CHD Risk Coronary Heart Disease Risk Table                     Men   Women  1/2 Average Risk   3.4   3.3  Average Risk       5.0   4.4  2 X Average Risk   9.6    7.1  3 X Average Risk  23.4   11.0        Use the calculated Patient Ratio above and the CHD Risk Table to determine the patient's CHD Risk.        ATP III CLASSIFICATION (LDL):  <100     mg/dL   Optimal  899-870  mg/dL   Near or Above                    Optimal  130-159  mg/dL   Borderline  839-810  mg/dL   High  >809     mg/dL   Very High   HDL-C >60 mg/dL - 44   Trlycerides <849 mg/dL - 691   Hemoglobin J8r <5.7 % 11.7 (NOTE)                                                                       According to the ADA Clinical Practice Recommendations for 2011, when HbA1c is used as a screening test:   >=6.5%   Diagnostic of Diabetes Mellitus           (if abnormal result  is confirmed)  5.7-6.4%   Increased risk of developing Diabetes Mellitus  References:Diagnosis and Classification of Diabetes Mellitus,Diabetes Care,2011,34(Suppl 1):S62-S69 and Standards of Medical Care in         Diabetes - 2011,Diabetes Care,2011,34  (Suppl 1):S11-S61.  -     Exercise Target Goals: Exercise Program Goal: Individual exercise prescription set using results from initial 6 min walk test and THRR while considering  patient's activity barriers and safety.   Exercise Prescription Goal: Initial exercise prescription builds to 30-45 minutes a day of aerobic activity, 2-3 days per week.  Home exercise guidelines will be given to patient during program as part of exercise prescription that the participant will acknowledge.   Education: Aerobic Exercise: - Group verbal and visual presentation on the components of exercise prescription. Introduces F.I.T.T principle from ACSM for exercise prescriptions.  Reviews F.I.T.T. principles of aerobic exercise including progression. Written material provided at class time.   Education: Resistance Exercise: -  Group verbal and visual presentation on the components of exercise prescription. Introduces F.I.T.T principle from ACSM for exercise prescriptions  Reviews  F.I.T.T. principles of resistance exercise including progression. Written material provided at class time.    Education: Exercise & Equipment Safety: - Individual verbal instruction and demonstration of equipment use and safety with use of the equipment.   Education: Exercise Physiology & General Exercise Guidelines: - Group verbal and written instruction with models to review the exercise physiology of the cardiovascular system and associated critical values. Provides general exercise guidelines with specific guidelines to those with heart or lung disease. Written material provided at class time.   Education: Flexibility, Balance, Mind/Body Relaxation: - Group verbal and visual presentation with interactive activity on the components of exercise prescription. Introduces F.I.T.T principle from ACSM for exercise prescriptions. Reviews F.I.T.T. principles of flexibility and balance exercise training including progression. Also discusses the mind body connection.  Reviews various relaxation techniques to help reduce and manage stress (i.e. Deep breathing, progressive muscle relaxation, and visualization). Balance handout provided to take home. Written material provided at class time.   Activity Barriers & Risk Stratification:  Activity Barriers & Cardiac Risk Stratification - 11/03/23 0837       Activity Barriers & Cardiac Risk Stratification   Activity Barriers Deconditioning;Muscular Weakness;Shortness of Breath;History of Falls;Balance Concerns;Chest Pain/Angina    Cardiac Risk Stratification High          6 Minute Walk:  6 Minute Walk     Row Name 11/03/23 1005         6 Minute Walk   Phase Initial     Distance 910 feet     Walk Time 6 minutes     # of Rest Breaks 0     MPH 1.72     METS 2.13     RPE 11     VO2 Peak 7.44     Symptoms Yes (comment)     Comments a little dizzy from turns     Resting HR 73 bpm     Resting BP 126/70     Resting Oxygen Saturation  96 %      Exercise Oxygen Saturation  during 6 min walk 96 %     Max Ex. HR 113 bpm     Max Ex. BP 194/84     2 Minute Post BP 186/84        Oxygen Initial Assessment:   Oxygen Re-Evaluation:   Oxygen Discharge (Final Oxygen Re-Evaluation):   Initial Exercise Prescription:  Initial Exercise Prescription - 11/03/23 1000       Date of Initial Exercise RX and Referring Provider   Date 11/03/23    Referring Provider Verlin Bruckner MD      Oxygen   Maintain Oxygen Saturation 88% or higher      Treadmill   MPH 1.2    Grade 0.5    Minutes 15    METs 2      NuStep   Level 1    SPM 80    Minutes 15    METs 2      Prescription Details   Frequency (times per week) 3    Duration Progress to 30 minutes of continuous aerobic without signs/symptoms of physical distress      Intensity   THRR 40-80% of Max Heartrate 103-134    Ratings of Perceived Exertion 11-13    Perceived Dyspnea 0-4      Progression   Progression Continue to progress workloads  to maintain intensity without signs/symptoms of physical distress.      Resistance Training   Training Prescription Yes    Weight 3 lb    Reps 10-15          Perform Capillary Blood Glucose checks as needed.  Exercise Prescription Changes:   Exercise Prescription Changes     Row Name 11/03/23 1000             Response to Exercise   Blood Pressure (Admit) 126/70       Blood Pressure (Exercise) 194/84       Blood Pressure (Exit) 162/74       Heart Rate (Admit) 73 bpm       Heart Rate (Exercise) 113 bpm       Heart Rate (Exit) 79 bpm       Oxygen Saturation (Admit) 96 %       Oxygen Saturation (Exercise) 96 %       Rating of Perceived Exertion (Exercise) 11       Symptoms a little dizzy from turns       Comments walk test results          Exercise Comments:   Exercise Goals and Review:   Exercise Goals     Row Name 11/03/23 1007             Exercise Goals   Increase Physical Activity Yes        Intervention Provide advice, education, support and counseling about physical activity/exercise needs.;Develop an individualized exercise prescription for aerobic and resistive training based on initial evaluation findings, risk stratification, comorbidities and participant's personal goals.       Expected Outcomes Short Term: Attend rehab on a regular basis to increase amount of physical activity.;Long Term: Exercising regularly at least 3-5 days a week.;Long Term: Add in home exercise to make exercise part of routine and to increase amount of physical activity.       Increase Strength and Stamina Yes       Intervention Provide advice, education, support and counseling about physical activity/exercise needs.;Develop an individualized exercise prescription for aerobic and resistive training based on initial evaluation findings, risk stratification, comorbidities and participant's personal goals.       Expected Outcomes Short Term: Increase workloads from initial exercise prescription for resistance, speed, and METs.;Short Term: Perform resistance training exercises routinely during rehab and add in resistance training at home;Long Term: Improve cardiorespiratory fitness, muscular endurance and strength as measured by increased METs and functional capacity ( )       Able to understand and use rate of perceived exertion (RPE) scale Yes       Intervention Provide education and explanation on how to use RPE scale       Expected Outcomes Short Term: Able to use RPE daily in rehab to express subjective intensity level;Long Term:  Able to use RPE to guide intensity level when exercising independently       Able to understand and use Dyspnea scale Yes       Intervention Provide education and explanation on how to use Dyspnea scale       Expected Outcomes Short Term: Able to use Dyspnea scale daily in rehab to express subjective sense of shortness of breath during exertion;Long Term: Able to use Dyspnea scale to  guide intensity level when exercising independently       Knowledge and understanding of Target Heart Rate Range (THRR) Yes       Intervention Provide  education and explanation of THRR including how the numbers were predicted and where they are located for reference       Expected Outcomes Short Term: Able to state/look up THRR;Long Term: Able to use THRR to govern intensity when exercising independently;Short Term: Able to use daily as guideline for intensity in rehab       Able to check pulse independently Yes       Intervention Provide education and demonstration on how to check pulse in carotid and radial arteries.;Review the importance of being able to check your own pulse for safety during independent exercise       Expected Outcomes Short Term: Able to explain why pulse checking is important during independent exercise;Long Term: Able to check pulse independently and accurately       Understanding of Exercise Prescription Yes       Intervention Provide education, explanation, and written materials on patient's individual exercise prescription       Expected Outcomes Short Term: Able to explain program exercise prescription;Long Term: Able to explain home exercise prescription to exercise independently          Exercise Goals Re-Evaluation :   Discharge Exercise Prescription (Final Exercise Prescription Changes):  Exercise Prescription Changes - 11/03/23 1000       Response to Exercise   Blood Pressure (Admit) 126/70    Blood Pressure (Exercise) 194/84    Blood Pressure (Exit) 162/74    Heart Rate (Admit) 73 bpm    Heart Rate (Exercise) 113 bpm    Heart Rate (Exit) 79 bpm    Oxygen Saturation (Admit) 96 %    Oxygen Saturation (Exercise) 96 %    Rating of Perceived Exertion (Exercise) 11    Symptoms a little dizzy from turns    Comments walk test results          Nutrition:  Target Goals: Understanding of nutrition guidelines, daily intake of sodium 1500mg , cholesterol  200mg , calories 30% from fat and 7% or less from saturated fats, daily to have 5 or more servings of fruits and vegetables.  Education: Nutrition 1 -Group instruction provided by verbal, written material, interactive activities, discussions, models, and posters to present general guidelines for heart healthy nutrition including macronutrients, label reading, and promoting whole foods over processed counterparts. Education serves as Pensions consultant of discussion of heart healthy eating for all. Written material provided at class time.    Education: Nutrition 2 -Group instruction provided by verbal, written material, interactive activities, discussions, models, and posters to present general guidelines for heart healthy nutrition including sodium, cholesterol, and saturated fat. Providing guidance of habit forming to improve blood pressure, cholesterol, and body weight. Written material provided at class time.     Biometrics:  Pre Biometrics - 11/03/23 1008       Pre Biometrics   Height 5' (1.524 m)    Weight 91.6 kg    Waist Circumference 38 inches    Hip Circumference 41 inches    Waist to Hip Ratio 0.93 %    BMI (Calculated) 39.43    Grip Strength 21.1 kg    Single Leg Stand 30 seconds           Nutrition Therapy Plan and Nutrition Goals:  Nutrition Therapy & Goals - 11/03/23 0846       Intervention Plan   Intervention Prescribe, educate and counsel regarding individualized specific dietary modifications aiming towards targeted core components such as weight, hypertension, lipid management, diabetes, heart failure and other comorbidities.;Nutrition handout(s)  given to patient.    Expected Outcomes Short Term Goal: Understand basic principles of dietary content, such as calories, fat, sodium, cholesterol and nutrients.;Long Term Goal: Adherence to prescribed nutrition plan.          Nutrition Assessments:  MEDIFICTS Score Key: >=70 Need to make dietary changes  40-70 Heart  Healthy Diet <= 40 Therapeutic Level Cholesterol Diet  Flowsheet Row CARDIAC REHAB PHASE II ORIENTATION from 11/03/2023 in [facility]  Picture Your Plate Total Score on Admission 68   Picture Your Plate Scores: <59 Unhealthy dietary pattern with much room for improvement. 41-50 Dietary pattern unlikely to meet recommendations for good health and room for improvement. 51-60 More healthful dietary pattern, with some room for improvement.  >60 Healthy dietary pattern, although there may be some specific behaviors that could be improved.    Nutrition Goals Re-Evaluation:   Nutrition Goals Discharge (Final Nutrition Goals Re-Evaluation):   Psychosocial: Target Goals: Acknowledge presence or absence of significant depression and/or stress, maximize coping skills, provide positive support system. Participant is able to verbalize types and ability to use techniques and skills needed for reducing stress and depression.   Education: Stress, Anxiety, and Depression - Group verbal and visual presentation to define topics covered.  Reviews how body is impacted by stress, anxiety, and depression.  Also discusses healthy ways to reduce stress and to treat/manage anxiety and depression. Written material provided at class time.   Education: Sleep Hygiene -Provides group verbal and written instruction about how sleep can affect your health.  Define sleep hygiene, discuss sleep cycles and impact of sleep habits. Review good sleep hygiene tips.   Initial Review & Psychosocial Screening:  Initial Psych Review & Screening - 11/03/23 0842       Initial Review   Current issues with Current Depression;Current Psychotropic Meds;Current Sleep Concerns;Current Stress Concerns    Source of Stress Concerns Chronic Illness;Unable to participate in former interests or hobbies;Unable to perform yard/household activities    Comments sleep good some days and not other days, lives alone,  generic effexor  has crazy dreams when she misses a dose and plans to talk to doctor about it      Family Dynamics   Good Support System? Yes   oldest daughter lives in town and one daughter in [place]     Barriers   Psychosocial barriers to participate in program Psychosocial barriers identified (see note);The patient should benefit from training in stress management and relaxation.      Screening Interventions   Interventions Encouraged to exercise;To provide support and resources with identified psychosocial needs;Provide feedback about the scores to participant    Expected Outcomes Short Term goal: Utilizing psychosocial counselor, staff and physician to assist with identification of specific Stressors or current issues interfering with healing process. Setting desired goal for each stressor or current issue identified.;Long Term Goal: Stressors or current issues are controlled or eliminated.;Long Term goal: The participant improves quality of Life and PHQ9 Scores as seen by post scores and/or verbalization of changes;Short Term goal: Identification and review with participant of any Quality of Life or Depression concerns found by scoring the questionnaire.          Quality of Life Scores:   Scores of 19 and below usually indicate a poorer quality of life in these areas.  A difference of  2-3 points is a clinically meaningful difference.  A difference of 2-3 points in the total score of the Quality of Life Index has been  associated with significant improvement in overall quality of life, self-image, physical symptoms, and general health in studies assessing change in quality of life.  PHQ-9: Review Flowsheet       11/03/2023  Depression screen PHQ 2/9  Decreased Interest 2  Down, Depressed, Hopeless 3  PHQ - 2 Score 5  Altered sleeping 3  Tired, decreased energy 3  Change in appetite 2  Feeling bad or failure about yourself  0  Trouble concentrating 0  Moving slowly or  fidgety/restless 0  Suicidal thoughts 0  PHQ-9 Score 13  Difficult doing work/chores Very difficult   Interpretation of Total Score  Total Score Depression Severity:  1-4 = Minimal depression, 5-9 = Mild depression, 10-14 = Moderate depression, 15-19 = Moderately severe depression, 20-27 = Severe depression   Psychosocial Evaluation and Intervention:  Psychosocial Evaluation - 11/03/23 1008       Psychosocial Evaluation & Interventions   Interventions Stress management education;Relaxation education;Encouraged to exercise with the program and follow exercise prescription    Comments Mardella is coming into cardiac rehab after a stent.  Prior to her stent she had had a syncopal episode and they believe the two may be linked.  She has also been experiencing high blood pressure reading recently and plans to discuss with her PCP tomorrow.  This will be her last appt with current PCP as he is retiring and she will be sad to see him go. They are also going to look at adding in renexa with her ongoing intermitten chest pain.  She has a history of bilateral breast cancer and a lung nodule.  She has not been very active recently and looking forward to getting up and moving again. She was to feel better and build up her energy.  She is also hoping that it will help her sleep better.  She does also note a history of depression and takes generic effexor  but finds that if she misses a dose she will have very weird and vivid dreams.  She plans to discuss this with her doctor as well.  She does not preceive any barriers to attending rehab at this time.  She lives alone but has two daughters; one lives in [place] and one in [place] so they are nearby.  Her biggest concern is making sure her sugar stays where it needs to be and she will check it to make sure she is good to go.    Expected Outcomes Short; Talk to doctor about meds Long: Attend rehab to build up stamina    Continue Psychosocial Services  Follow  up required by staff          Psychosocial Re-Evaluation:   Psychosocial Discharge (Final Psychosocial Re-Evaluation):   Vocational Rehabilitation: Provide vocational rehab assistance to qualifying candidates.   Vocational Rehab Evaluation & Intervention:  Vocational Rehab - 11/03/23 0842       Initial Vocational Rehab Evaluation & Intervention   Assessment shows need for Vocational Rehabilitation No   retired         Education: Education Goals: Education classes will be provided on a variety of topics geared toward better understanding of heart health and risk factor modification. Participant will state understanding/return demonstration of topics presented as noted by education test scores.  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   Test and Procedures: - Group verbal and visual presentation and models provide information about basic cardiac anatomy and function. Reviews the testing methods done to diagnose heart disease and the outcomes of the test results. Describes the treatment choices: Medical Management, Angioplasty, or Coronary Bypass Surgery for treating various heart conditions including Myocardial Infarction, Angina, Valve Disease, and Cardiac Arrhythmias. Written material provided at class time.   Medication Safety: - Group verbal and visual instruction to review commonly prescribed medications for heart and lung disease. Reviews the medication, class of the drug, and side effects. Includes the steps to properly store  meds and maintain the prescription regimen. Written material provided at class time.   Intimacy: - Group verbal instruction through game format to discuss how heart and lung disease can affect sexual intimacy. Written material provided at class time.   Know Your Numbers and Heart Failure: - Group verbal and visual instruction to discuss disease risk factors for cardiac and pulmonary disease and treatment options.  Reviews associated critical values for Overweight/Obesity, Hypertension, Cholesterol, and Diabetes.  Discusses basics of heart failure: signs/symptoms and treatments.  Introduces Heart Failure Zone chart for action plan for heart failure. Written material provided at class time.   Infection Prevention: - Provides verbal and written material to individual with discussion of infection control including proper hand washing and proper equipment cleaning during exercise session.   Falls Prevention: - Provides verbal and written material to individual with discussion of falls prevention and safety.   Other: -Provides group and verbal instruction on various topics (see comments)   Knowledge Questionnaire Score:   Core Components/Risk Factors/Patient Goals at Admission:  Personal Goals and Risk Factors at Admission - 11/03/23 1015       Core Components/Risk Factors/Patient Goals on Admission    Weight Management Yes;Obesity;Weight Loss    Intervention Weight Management: Develop a combined nutrition and exercise program designed to reach desired caloric intake, while maintaining appropriate intake of nutrient and fiber, sodium and fats, and appropriate energy expenditure required for the weight goal.;Weight Management: Provide education and appropriate resources to help participant work on and attain dietary goals.;Weight Management/Obesity: Establish reasonable short term and long term weight goals.;Obesity: Provide education and appropriate resources to help participant work on and  attain dietary goals.    Admit Weight 201 lb 14.4 oz (91.6 kg)    Goal Weight: Short Term 195 lb (88.5 kg)    Goal Weight: Long Term 190 lb (86.2 kg)    Expected Outcomes Short Term: Continue to assess and modify interventions until short term weight is achieved;Long Term: Adherence to nutrition and physical activity/exercise program aimed toward attainment of established weight goal;Weight Loss: Understanding of general recommendations for a balanced deficit meal plan, which promotes 1-2 lb weight loss per week and includes a negative energy balance of [phone] kcal/d;Understanding recommendations for meals to include 15-35% energy as protein, 25-35% energy from fat, 35-60% energy from carbohydrates, less than 200mg  of dietary cholesterol, 20-35 gm of total fiber daily;Understanding of distribution of calorie intake throughout the day with the consumption of 4-5 meals/snacks    Improve shortness of breath with ADL's Yes    Intervention Provide education, individualized exercise plan and daily activity instruction to help decrease symptoms of SOB  with activities of daily living.    Expected Outcomes Short Term: Improve cardiorespiratory fitness to achieve a reduction of symptoms when performing ADLs;Long Term: Be able to perform more ADLs without symptoms or delay the onset of symptoms    Diabetes Yes    Intervention Provide education about signs/symptoms and action to take for hypo/hyperglycemia.;Provide education about proper nutrition, including hydration, and aerobic/resistive exercise prescription along with prescribed medications to achieve blood glucose in normal ranges: Fasting glucose 65-99 mg/dL    Expected Outcomes Short Term: Participant verbalizes understanding of the signs/symptoms and immediate care of hyper/hypoglycemia, proper foot care and importance of medication, aerobic/resistive exercise and nutrition plan for blood glucose control.;Long Term: Attainment of HbA1C < 7%.     Hypertension Yes    Intervention Provide education on lifestyle modifcations including regular physical activity/exercise, weight management, moderate sodium restriction and increased consumption of fresh fruit, vegetables, and low fat dairy, alcohol moderation, and smoking cessation.;Monitor prescription use compliance.    Expected Outcomes Short Term: Continued assessment and intervention until BP is < 140/36mm HG in hypertensive participants. < 130/37mm HG in hypertensive participants with diabetes, heart failure or chronic kidney disease.;Long Term: Maintenance of blood pressure at goal levels.    Lipids Yes    Intervention Provide education and support for participant on nutrition & aerobic/resistive exercise along with prescribed medications to achieve LDL 70mg , HDL >40mg .    Expected Outcomes Long Term: Cholesterol controlled with medications as prescribed, with individualized exercise RX and with personalized nutrition plan. Value goals: LDL < 70mg , HDL > 40 mg.;Short Term: Participant states understanding of desired cholesterol values and is compliant with medications prescribed. Participant is following exercise prescription and nutrition guidelines.          Education:Diabetes - Individual verbal and written instruction to review signs/symptoms of diabetes, desired ranges of glucose level fasting, after meals and with exercise. Acknowledge that pre and post exercise glucose checks will be done for 3 sessions at entry of program.   Core Components/Risk Factors/Patient Goals Review:    Core Components/Risk Factors/Patient Goals at Discharge (Final Review):    ITP Comments:  ITP Comments     Row Name 11/03/23 0956           ITP Comments Patient attend orientation today.  Patient is attending Cardiac Rehabilitation Program.  Documentation for diagnosis can be found in [facility] 08/19/23.  Reviewed medical chart, RPE/RPD, gym safety, and program guidelines.  Patient was fitted to  equipment they will be using during rehab.  Patient is scheduled to start exercise on Wednesday 11/04/23 at 915.   Initial ITP created and sent for review and signature by Dr. Dorn Ross, Medical Director for Cardiac Rehabilitation Program.          Comments: Initial ITP

## 2023-11-04 ENCOUNTER — Encounter (HOSPITAL_COMMUNITY)
Admission: RE | Admit: 2023-11-04 | Discharge: 2023-11-04 | Disposition: A | Discharge status: Home / Self Care | Source: Ambulatory Visit | Attending: Cardiovascular Disease | Admitting: Cardiovascular Disease

## 2023-11-04 DIAGNOSIS — H409 Unspecified glaucoma: Secondary | ICD-10-CM | POA: Diagnosis not present

## 2023-11-04 DIAGNOSIS — Z955 Presence of coronary angioplasty implant and graft: Secondary | ICD-10-CM | POA: Diagnosis not present

## 2023-11-04 DIAGNOSIS — I10 Essential (primary) hypertension: Secondary | ICD-10-CM | POA: Diagnosis not present

## 2023-11-04 DIAGNOSIS — E7849 Other hyperlipidemia: Secondary | ICD-10-CM | POA: Diagnosis not present

## 2023-11-04 DIAGNOSIS — E782 Mixed hyperlipidemia: Secondary | ICD-10-CM | POA: Diagnosis not present

## 2023-11-04 LAB — GLUCOSE, CAPILLARY: Glucose-Capillary: 182 mg/dL — ABNORMAL HIGH (ref 70–99)

## 2023-11-04 NOTE — Progress Notes (Signed)
 Daily Session Note  Patient Details  Name: Miranda Fuller MRN: [id number] Date of Birth: 27-Jun-1950 Referring Provider:   Flowsheet Row CARDIAC REHAB PHASE II ORIENTATION from 11/03/2023 in [facility] CARDIAC REHABILITATION  Referring Provider Verlin Bruckner MD    Encounter Date: 11/04/2023  Check In:  Session Check In - 11/04/23 0947       Check-In   Supervising physician immediately available to respond to emergencies See telemetry face sheet for immediately available MD    Location [facility]    Staff Present Adrien Louder, RN, BSN;Brittany Jackquline, BSN, RN, Estrella Daring, BS, RRT, CPFT;Kolleen Ochsner Zina, RN    Virtual Visit No    Medication changes reported     No    Fall or balance concerns reported    No    Warm-up and Cool-down Performed on first and last piece of equipment    Resistance Training Performed Yes    VAD Patient? No    PAD/SET Patient? No      Pain Assessment   Currently in Pain? No/denies          Capillary Blood Glucose: Results for orders placed or performed during the hospital encounter of 11/04/23 (from the past 24 hours)  Glucose, capillary     Status: Abnormal   Collection Time: 11/04/23  9:40 AM  Result Value Ref Range   Glucose-Capillary 182 (H) 70 - 99 mg/dL      Social History   Tobacco Use  Smoking Status Never  Smokeless Tobacco Never    Goals Met:  Independence with exercise equipment Exercise tolerated well No report of concerns or symptoms today Strength training completed today  Goals Unmet:  Not Applicable  Comments: First full day of exercise!  Patient was oriented to gym and equipment including functions, settings, policies, and procedures.  Patient's individual exercise prescription and treatment plan were reviewed.  All starting workloads were established based on the results of the 6 minute walk test done at initial orientation visit.  The plan for exercise progression was also introduced  and progression will be customized based on patient's performance and goals.

## 2023-11-06 ENCOUNTER — Encounter (HOSPITAL_COMMUNITY)
Admission: RE | Admit: 2023-11-06 | Discharge: 2023-11-06 | Disposition: A | Discharge status: Home / Self Care | Source: Ambulatory Visit | Attending: Cardiovascular Disease | Admitting: Cardiovascular Disease

## 2023-11-06 DIAGNOSIS — Z955 Presence of coronary angioplasty implant and graft: Secondary | ICD-10-CM | POA: Diagnosis not present

## 2023-11-06 LAB — GLUCOSE, CAPILLARY
Glucose-Capillary: 157 mg/dL — ABNORMAL HIGH (ref 70–99)
Glucose-Capillary: 115 mg/dL — ABNORMAL HIGH (ref 70–99)

## 2023-11-06 NOTE — Progress Notes (Signed)
 Daily Session Note  Patient Details  Name: Miranda Fuller MRN: [id number] Date of Birth: 1950-05-08 Referring Provider:   Flowsheet Row CARDIAC REHAB PHASE II ORIENTATION from 11/03/2023 in [facility] CARDIAC [facility]IT[facility]  Referring Provider Verlin Bruckner MD    Encounter Date: 11/06/2023  Check In:  Session Check In - 11/06/23 0911       Check-In   Supervising physician immediately available to respond to emergencies See telemetry face sheet for immediately available MD    Location [facility]    Staff Present Powell Benders, BS, Exercise Physiologist;Jessica Vonzell, MA, RCEP, CCRP, CCET    Virtual Visit No    Medication changes reported     No    Fall or balance concerns reported    No    Tobacco Cessation No Change    Warm-up and Cool-down Performed on first and last piece of equipment    Resistance Training Performed Yes    VAD Patient? No    PAD/SET Patient? No      Pain Assessment   Currently in Pain? No/denies          Capillary Blood Glucose: No results found for this or any previous visit (from the past 24 hours).    Social History   Tobacco Use  Smoking Status Never  Smokeless Tobacco Never    Goals Met:  Independence with exercise equipment Exercise tolerated well No report of concerns or symptoms today Strength training completed today  Goals Unmet:  Not Applicable  Comments: Pt able to follow exercise prescription today without complaint.  Will continue to monitor for progression.

## 2023-11-09 ENCOUNTER — Encounter (HOSPITAL_COMMUNITY)

## 2023-11-09 ENCOUNTER — Telehealth (HOSPITAL_COMMUNITY): Payer: Self-pay

## 2023-11-11 ENCOUNTER — Encounter (HOSPITAL_COMMUNITY)

## 2023-11-13 ENCOUNTER — Encounter (HOSPITAL_COMMUNITY)

## 2023-11-13 ENCOUNTER — Telehealth (HOSPITAL_COMMUNITY): Payer: Self-pay

## 2023-11-13 DIAGNOSIS — E1122 Type 2 diabetes mellitus with diabetic chronic kidney disease: Secondary | ICD-10-CM | POA: Diagnosis not present

## 2023-11-13 DIAGNOSIS — E782 Mixed hyperlipidemia: Secondary | ICD-10-CM | POA: Diagnosis not present

## 2023-11-18 ENCOUNTER — Encounter (HOSPITAL_COMMUNITY)

## 2023-11-20 ENCOUNTER — Encounter (HOSPITAL_COMMUNITY)

## 2023-11-23 ENCOUNTER — Encounter (HOSPITAL_COMMUNITY)

## 2023-11-25 ENCOUNTER — Encounter (HOSPITAL_COMMUNITY)

## 2023-11-25 ENCOUNTER — Encounter (HOSPITAL_COMMUNITY): Payer: Self-pay | Admitting: *Deleted

## 2023-11-25 DIAGNOSIS — Z955 Presence of coronary angioplasty implant and graft: Secondary | ICD-10-CM

## 2023-11-25 NOTE — Progress Notes (Signed)
 Cardiac Individual Treatment Plan  Patient Details  Name: Miranda Fuller MRN: [id number] Date of Birth: 02-Jan-1951 Referring Provider:   Flowsheet Row CARDIAC REHAB PHASE II ORIENTATION from 11/03/2023 in [facility]  Referring Provider Verlin Bruckner MD    Initial Encounter Date:  Flowsheet Row CARDIAC REHAB PHASE II ORIENTATION from 11/03/2023 in [facility]  Date 11/03/23    Visit Diagnosis: Status post coronary artery stent placement  Patient's Home Medications on Admission:  Current Outpatient Medications:    acetaminophen  (TYLENOL ) 325 MG tablet, Take 500 mg by mouth every 6 (six) hours as needed., Disp: , Rfl:    anastrozole  (ARIMIDEX ) 1 MG tablet, Take 1 tablet (1 mg total) by mouth daily., Disp: 45 tablet, Rfl: 3   aspirin  81 MG tablet, Take 81 mg by mouth daily., Disp: , Rfl:    calcium carbonate (OS-CAL) 1250 (500 Ca) MG chewable tablet, Chew 1 tablet by mouth daily., Disp: , Rfl:    Calcium Carbonate-Vit D-Min (CALCIUM 1200 PO), Take 1,200 mg by mouth 2 (two) times daily., Disp: , Rfl:    Cholecalciferol (VITAMIN D -3) 25 MCG (1000 UT) CAPS, Take 3,000 Units by mouth daily., Disp: , Rfl:    clopidogrel  (PLAVIX ) 75 MG tablet, Take 1 tablet (75 mg total) by mouth daily with breakfast., Disp: 90 tablet, Rfl: 2   cyanocobalamin  (VITAMIN B12) 1000 MCG tablet, Take 1,000 mcg by mouth 2 (two) times daily., Disp: , Rfl:    ezetimibe  (ZETIA ) 10 MG tablet, Take 10 mg by mouth daily., Disp: , Rfl:    insulin  aspart (NOVOLOG ) 100 UNIT/ML injection, Inject 5-15 Units into the skin 3 (three) times daily with meals. 5-15 units on sliding scale , Disp: , Rfl:    insulin  glargine (LANTUS ) 100 UNIT/ML injection, Inject 45 Units into the skin at bedtime., Disp: , Rfl:    lisinopril -hydrochlorothiazide  (ZESTORETIC ) 10-12.5 MG tablet, Take 1 tablet by mouth daily., Disp: , Rfl:    metoprolol  succinate (TOPROL  XL) 25 MG 24 hr tablet, Take 1 tablet  (25 mg total) by mouth daily., Disp: 30 tablet, Rfl: 2   Multiple Vitamins-Minerals (MULTIVITAMIN ADULT PO), Take 1 tablet by mouth daily., Disp: , Rfl:    nitroGLYCERIN  (NITROSTAT ) 0.4 MG SL tablet, Place 1 tablet (0.4 mg total) under the tongue every 5 (five) minutes as needed., Disp: 25 tablet, Rfl: 2   Semaglutide , 1 MG/DOSE, (OZEMPIC , 1 MG/DOSE,) 4 MG/3ML SOPN, Inject 1 mg into the skin once a week., Disp: , Rfl:    venlafaxine  XR (EFFEXOR -XR) 150 MG 24 hr capsule, Take 150 mg by mouth daily., Disp: , Rfl:   Past Medical History: Past Medical History:  Diagnosis Date   Breast mass 12/27/2018   right- cancer, s/p lumpectomy, radiation   Coccidioidomycosis, pulmonary (HCC)    Complication of anesthesia    DDD (degenerative disc disease), cervical    Dementia (HCC)    Early per Dr. Vonzell   Depression 2009   DM (diabetes mellitus screen) 2009   Family history of bone cancer    Family history of brain cancer    Family history of breast cancer    Family history of esophageal cancer    Family history of lung cancer    Glaucoma    HTN (hypertension)    Morbid obesity (HCC)    Nodule of upper lobe of left lung 12/27/2018   Personal history of radiation therapy    PONV (postoperative nausea and vomiting)    Sleep  apnea    had surgery   Type II diabetes mellitus, uncontrolled 01/31/2019    Tobacco Use: Social History   Tobacco Use  Smoking Status Never  Smokeless Tobacco Never    Labs: Review Flowsheet       Latest Ref Rng & Units 01/29/2010 01/30/2010  Labs for ITP Cardiac and Pulmonary Rehab  Cholestrol 0 - 200 mg/dL - 815        ATP III CLASSIFICATION:  <200     mg/dL   Desirable  799-760  mg/dL   Borderline High  >=759    mg/dL   High          LDL (calc) 0 - 99 mg/dL - 78        Total Cholesterol/HDL:CHD Risk Coronary Heart Disease Risk Table                     Men   Women  1/2 Average Risk   3.4   3.3  Average Risk       5.0   4.4  2 X Average Risk   9.6    7.1  3 X Average Risk  23.4   11.0        Use the calculated Patient Ratio above and the CHD Risk Table to determine the patient's CHD Risk.        ATP III CLASSIFICATION (LDL):  <100     mg/dL   Optimal  899-870  mg/dL   Near or Above                    Optimal  130-159  mg/dL   Borderline  839-810  mg/dL   High  >809     mg/dL   Very High   HDL-C >60 mg/dL - 44   Trlycerides <849 mg/dL - 691   Hemoglobin J8r <5.7 % 11.7 (NOTE)                                                                       According to the ADA Clinical Practice Recommendations for 2011, when HbA1c is used as a screening test:   >=6.5%   Diagnostic of Diabetes Mellitus           (if abnormal result  is confirmed)  5.7-6.4%   Increased risk of developing Diabetes Mellitus  References:Diagnosis and Classification of Diabetes Mellitus,Diabetes Care,2011,34(Suppl 1):S62-S69 and Standards of Medical Care in         Diabetes - 2011,Diabetes Rjmz,7988,65  (Suppl 1):S11-S61.  -    Capillary Blood Glucose: Lab Results  Component Value Date   GLUCAP 115 (H) 11/06/2023   GLUCAP 157 (H) 11/06/2023   GLUCAP 182 (H) 11/04/2023   GLUCAP 149 (H) 08/19/2023   GLUCAP 117 (H) 08/19/2023     Exercise Target Goals: Exercise Program Goal: Individual exercise prescription set using results from initial 6 min walk test and THRR while considering  patient's activity barriers and safety.   Exercise Prescription Goal: Starting with aerobic activity 30 plus minutes a day, 3 days per week for initial exercise prescription. Provide home exercise prescription and guidelines that participant acknowledges understanding prior to discharge.  Activity Barriers & Risk Stratification:  Activity Barriers &  Cardiac Risk Stratification - 11/03/23 0837       Activity Barriers & Cardiac Risk Stratification   Activity Barriers Deconditioning;Muscular Weakness;Shortness of Breath;History of Falls;Balance Concerns;Chest Pain/Angina     Cardiac Risk Stratification High          6 Minute Walk:  6 Minute Walk     Row Name 11/03/23 1005         6 Minute Walk   Phase Initial     Distance 910 feet     Walk Time 6 minutes     # of Rest Breaks 0     MPH 1.72     METS 2.13     RPE 11     VO2 Peak 7.44     Symptoms Yes (comment)     Comments a little dizzy from turns     Resting HR 73 bpm     Resting BP 126/70     Resting Oxygen Saturation  96 %     Exercise Oxygen Saturation  during 6 min walk 96 %     Max Ex. HR 113 bpm     Max Ex. BP 194/84     2 Minute Post BP 186/84        Oxygen Initial Assessment:   Oxygen Re-Evaluation:   Oxygen Discharge (Final Oxygen Re-Evaluation):   Initial Exercise Prescription:  Initial Exercise Prescription - 11/03/23 1000       Date of Initial Exercise RX and Referring Provider   Date 11/03/23    Referring Provider Verlin Bruckner MD      Oxygen   Maintain Oxygen Saturation 88% or higher      Treadmill   MPH 1.2    Grade 0.5    Minutes 15    METs 2      NuStep   Level 1    SPM 80    Minutes 15    METs 2      Prescription Details   Frequency (times per week) 3    Duration Progress to 30 minutes of continuous aerobic without signs/symptoms of physical distress      Intensity   THRR 40-80% of Max Heartrate 103-134    Ratings of Perceived Exertion 11-13    Perceived Dyspnea 0-4      Progression   Progression Continue to progress workloads to maintain intensity without signs/symptoms of physical distress.      Resistance Training   Training Prescription Yes    Weight 3 lb    Reps 10-15          Perform Capillary Blood Glucose checks as needed.  Exercise Prescription Changes:   Exercise Prescription Changes     Row Name 11/03/23 1000             Response to Exercise   Blood Pressure (Admit) 126/70       Blood Pressure (Exercise) 194/84       Blood Pressure (Exit) 162/74       Heart Rate (Admit) 73 bpm       Heart Rate  (Exercise) 113 bpm       Heart Rate (Exit) 79 bpm       Oxygen Saturation (Admit) 96 %       Oxygen Saturation (Exercise) 96 %       Rating of Perceived Exertion (Exercise) 11       Symptoms a little dizzy from turns       Comments walk test results  Exercise Comments:   Exercise Comments     Row Name 11/04/23 1256           Exercise Comments First full day of exercise!  Patient was oriented to gym and equipment including functions, settings, policies, and procedures.  Patient's individual exercise prescription and treatment plan were reviewed.  All starting workloads were established based on the results of the 6 minute walk test done at initial orientation visit.  The plan for exercise progression was also introduced and progression will be customized based on patient's performance and goals.          Exercise Goals and Review:   Exercise Goals     Row Name 11/03/23 1007             Exercise Goals   Increase Physical Activity Yes       Intervention Provide advice, education, support and counseling about physical activity/exercise needs.;Develop an individualized exercise prescription for aerobic and resistive training based on initial evaluation findings, risk stratification, comorbidities and participant's personal goals.       Expected Outcomes Short Term: Attend rehab on a regular basis to increase amount of physical activity.;Long Term: Exercising regularly at least 3-5 days a week.;Long Term: Add in home exercise to make exercise part of routine and to increase amount of physical activity.       Increase Strength and Stamina Yes       Intervention Provide advice, education, support and counseling about physical activity/exercise needs.;Develop an individualized exercise prescription for aerobic and resistive training based on initial evaluation findings, risk stratification, comorbidities and participant's personal goals.       Expected Outcomes Short Term:  Increase workloads from initial exercise prescription for resistance, speed, and METs.;Short Term: Perform resistance training exercises routinely during rehab and add in resistance training at home;Long Term: Improve cardiorespiratory fitness, muscular endurance and strength as measured by increased METs and functional capacity ( )       Able to understand and use rate of perceived exertion (RPE) scale Yes       Intervention Provide education and explanation on how to use RPE scale       Expected Outcomes Short Term: Able to use RPE daily in rehab to express subjective intensity level;Long Term:  Able to use RPE to guide intensity level when exercising independently       Able to understand and use Dyspnea scale Yes       Intervention Provide education and explanation on how to use Dyspnea scale       Expected Outcomes Short Term: Able to use Dyspnea scale daily in rehab to express subjective sense of shortness of breath during exertion;Long Term: Able to use Dyspnea scale to guide intensity level when exercising independently       Knowledge and understanding of Target Heart Rate Range (THRR) Yes       Intervention Provide education and explanation of THRR including how the numbers were predicted and where they are located for reference       Expected Outcomes Short Term: Able to state/look up THRR;Long Term: Able to use THRR to govern intensity when exercising independently;Short Term: Able to use daily as guideline for intensity in rehab       Able to check pulse independently Yes       Intervention Provide education and demonstration on how to check pulse in carotid and radial arteries.;Review the importance of being able to check your own pulse for safety during independent exercise  Expected Outcomes Short Term: Able to explain why pulse checking is important during independent exercise;Long Term: Able to check pulse independently and accurately       Understanding of Exercise  Prescription Yes       Intervention Provide education, explanation, and written materials on patient's individual exercise prescription       Expected Outcomes Short Term: Able to explain program exercise prescription;Long Term: Able to explain home exercise prescription to exercise independently          Exercise Goals Re-Evaluation :  Exercise Goals Re-Evaluation     Row Name 11/04/23 1257             Exercise Goal Re-Evaluation   Exercise Goals Review Understanding of Exercise Prescription;Able to understand and use Dyspnea scale;Able to understand and use rate of perceived exertion (RPE) scale;Knowledge and understanding of Target Heart Rate Range (THRR)       Comments Reviewed RPE and dyspnea scale, THR and program prescription with pt today.  Pt voiced understanding and was given a copy of goals to take home.       Expected Outcomes Short: Use RPE daily to regulate intensity.  Long: Follow program prescription in THR.           Discharge Exercise Prescription (Final Exercise Prescription Changes):  Exercise Prescription Changes - 11/03/23 1000       Response to Exercise   Blood Pressure (Admit) 126/70    Blood Pressure (Exercise) 194/84    Blood Pressure (Exit) 162/74    Heart Rate (Admit) 73 bpm    Heart Rate (Exercise) 113 bpm    Heart Rate (Exit) 79 bpm    Oxygen Saturation (Admit) 96 %    Oxygen Saturation (Exercise) 96 %    Rating of Perceived Exertion (Exercise) 11    Symptoms a little dizzy from turns    Comments walk test results          Nutrition:  Target Goals: Understanding of nutrition guidelines, daily intake of sodium 1500mg , cholesterol 200mg , calories 30% from fat and 7% or less from saturated fats, daily to have 5 or more servings of fruits and vegetables.  Biometrics:  Pre Biometrics - 11/03/23 1008       Pre Biometrics   Height 5' (1.524 m)    Weight 201 lb 14.4 oz (91.6 kg)    Waist Circumference 38 inches    Hip Circumference 41  inches    Waist to Hip Ratio 0.93 %    BMI (Calculated) 39.43    Grip Strength 21.1 kg    Single Leg Stand 30 seconds           Nutrition Therapy Plan and Nutrition Goals:  Nutrition Therapy & Goals - 11/03/23 0846       Intervention Plan   Intervention Prescribe, educate and counsel regarding individualized specific dietary modifications aiming towards targeted core components such as weight, hypertension, lipid management, diabetes, heart failure and other comorbidities.;Nutrition handout(s) given to patient.    Expected Outcomes Short Term Goal: Understand basic principles of dietary content, such as calories, fat, sodium, cholesterol and nutrients.;Long Term Goal: Adherence to prescribed nutrition plan.          Nutrition Assessments:  MEDIFICTS Score Key: >=70 Need to make dietary changes  40-70 Heart Healthy Diet <= 40 Therapeutic Level Cholesterol Diet  Flowsheet Row CARDIAC REHAB PHASE II ORIENTATION from 11/03/2023 in [facility]  Picture Your Plate Total Score on Admission 68  Picture Your Plate Scores: <59 Unhealthy dietary pattern with much room for improvement. 41-50 Dietary pattern unlikely to meet recommendations for good health and room for improvement. 51-60 More healthful dietary pattern, with some room for improvement.  >60 Healthy dietary pattern, although there may be some specific behaviors that could be improved.    Nutrition Goals Re-Evaluation:   Nutrition Goals Discharge (Final Nutrition Goals Re-Evaluation):   Psychosocial: Target Goals: Acknowledge presence or absence of significant depression and/or stress, maximize coping skills, provide positive support system. Participant is able to verbalize types and ability to use techniques and skills needed for reducing stress and depression.  Initial Review & Psychosocial Screening:  Initial Psych Review & Screening - 11/03/23 0842       Initial Review   Current issues  with Current Depression;Current Psychotropic Meds;Current Sleep Concerns;Current Stress Concerns    Source of Stress Concerns Chronic Illness;Unable to participate in former interests or hobbies;Unable to perform yard/household activities    Comments sleep good some days and not other days, lives alone, generic effexor  has crazy dreams when she misses a dose and plans to talk to doctor about it      Family Dynamics   Good Support System? Yes   oldest daughter lives in town and one daughter in [place]     Barriers   Psychosocial barriers to participate in program Psychosocial barriers identified (see note);The patient should benefit from training in stress management and relaxation.      Screening Interventions   Interventions Encouraged to exercise;To provide support and resources with identified psychosocial needs;Provide feedback about the scores to participant    Expected Outcomes Short Term goal: Utilizing psychosocial counselor, staff and physician to assist with identification of specific Stressors or current issues interfering with healing process. Setting desired goal for each stressor or current issue identified.;Long Term Goal: Stressors or current issues are controlled or eliminated.;Long Term goal: The participant improves quality of Life and PHQ9 Scores as seen by post scores and/or verbalization of changes;Short Term goal: Identification and review with participant of any Quality of Life or Depression concerns found by scoring the questionnaire.          Quality of Life Scores:  Quality of Life - 11/04/23 1256       Quality of Life   Select Quality of Life      Quality of Life Scores   Health/Function Pre 14.57 %    Socioeconomic Pre 21 %    Psych/Spiritual Pre 23.79 %    Family Pre 15.75 %    GLOBAL Pre 18.03 %         Scores of 19 and below usually indicate a poorer quality of life in these areas.  A difference of  2-3 points is a clinically meaningful  difference.  A difference of 2-3 points in the total score of the Quality of Life Index has been associated with significant improvement in overall quality of life, self-image, physical symptoms, and general health in studies assessing change in quality of life.  PHQ-9: Review Flowsheet       11/03/2023  Depression screen PHQ 2/9  Decreased Interest 2  Down, Depressed, Hopeless 3  PHQ - 2 Score 5  Altered sleeping 3  Tired, decreased energy 3  Change in appetite 2  Feeling bad or failure about yourself  0  Trouble concentrating 0  Moving slowly or fidgety/restless 0  Suicidal thoughts 0  PHQ-9 Score 13  Difficult doing work/chores Very difficult  Interpretation of Total Score  Total Score Depression Severity:  1-4 = Minimal depression, 5-9 = Mild depression, 10-14 = Moderate depression, 15-19 = Moderately severe depression, 20-27 = Severe depression   Psychosocial Evaluation and Intervention:  Psychosocial Evaluation - 11/03/23 1008       Psychosocial Evaluation & Interventions   Interventions Stress management education;Relaxation education;Encouraged to exercise with the program and follow exercise prescription    Comments Bernarda is coming into cardiac rehab after a stent.  Prior to her stent she had had a syncopal episode and they believe the two may be linked.  She has also been experiencing high blood pressure reading recently and plans to discuss with her PCP tomorrow.  This will be her last appt with current PCP as he is retiring and she will be sad to see him go. They are also going to look at adding in renexa with her ongoing intermitten chest pain.  She has a history of bilateral breast cancer and a lung nodule.  She has not been very active recently and looking forward to getting up and moving again. She was to feel better and build up her energy.  She is also hoping that it will help her sleep better.  She does also note a history of depression and takes generic effexor  but  finds that if she misses a dose she will have very weird and vivid dreams.  She plans to discuss this with her doctor as well.  She does not preceive any barriers to attending rehab at this time.  She lives alone but has two daughters; one lives in [place] and one in [place] so they are nearby.  Her biggest concern is making sure her sugar stays where it needs to be and she will check it to make sure she is good to go.    Expected Outcomes Short; Talk to doctor about meds Long: Attend rehab to build up stamina    Continue Psychosocial Services  Follow up required by staff          Psychosocial Re-Evaluation:   Psychosocial Discharge (Final Psychosocial Re-Evaluation):   Vocational Rehabilitation: Provide vocational rehab assistance to qualifying candidates.   Vocational Rehab Evaluation & Intervention:  Vocational Rehab - 11/03/23 0842       Initial Vocational Rehab Evaluation & Intervention   Assessment shows need for Vocational Rehabilitation No   retired         Education: Education Goals: Education classes will be provided on a weekly basis, covering required topics. Participant will state understanding/return demonstration of topics presented.  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 Discus how high blood pressure affects the body and ways to reduce high blood pressure.   Exercise and Your Heart -Discuss why it is important to exercise, the FITT principles of exercise, normal and abnormal responses to exercise, and how to exercise safely.   Angina -Discuss definition of angina, causes of angina,  treatment of angina, and how to decrease risk of having angina.   Cardiac Medications -Review what the following cardiac medications are used for, how they affect the body, and side effects that may occur when taking the medications.  Medications  include Aspirin , Beta blockers, calcium channel blockers, ACE Inhibitors, angiotensin receptor blockers, diuretics, digoxin, and antihyperlipidemics.   Congestive Heart Failure -Discuss the definition of CHF, how to live with CHF, the signs and symptoms of CHF, and how keep track of weight and sodium intake.   Heart Disease and Intimacy -Discus the effect sexual activity has on the heart, how changes occur during intimacy as we age, and safety during sexual activity.   Smoking Cessation / COPD -Discuss different methods to quit smoking, the health benefits of quitting smoking, and the definition of COPD.   Nutrition I: Fats -Discuss the types of cholesterol, what cholesterol does to the heart, and how cholesterol levels can be controlled.   Nutrition II: Labels -Discuss the different components of food labels and how to read food label   Heart Parts/Heart Disease and PAD -Discuss the anatomy of the heart, the pathway of blood circulation through the heart, and these are affected by heart disease.   Stress I: Signs and Symptoms -Discuss the causes of stress, how stress may lead to anxiety and depression, and ways to limit stress.   Stress II: Relaxation -Discuss different types of relaxation techniques to limit stress.   Warning Signs of Stroke / TIA -Discuss definition of a stroke, what the signs and symptoms are of a stroke, and how to identify when someone is having stroke.   Knowledge Questionnaire Score:  Knowledge Questionnaire Score - 11/04/23 1256       Knowledge Questionnaire Score   Pre Score 25/26          Core Components/Risk Factors/Patient Goals at Admission:  Personal Goals and Risk Factors at Admission -  11/03/23 1015       Core Components/Risk Factors/Patient Goals on Admission    Weight Management Yes;Obesity;Weight Loss    Intervention Weight Management: Develop a combined nutrition and exercise program designed to reach desired caloric intake, while maintaining appropriate intake of nutrient and fiber, sodium and fats, and appropriate energy expenditure required for the weight goal.;Weight Management: Provide education and appropriate resources to help participant work on and attain dietary goals.;Weight Management/Obesity: Establish reasonable short term and long term weight goals.;Obesity: Provide education and appropriate resources to help participant work on and attain dietary goals.    Admit Weight 201 lb 14.4 oz (91.6 kg)    Goal Weight: Short Term 195 lb (88.5 kg)    Goal Weight: Long Term 190 lb (86.2 kg)    Expected Outcomes Short Term: Continue to assess and modify interventions until short term weight is achieved;Long Term: Adherence to nutrition and physical activity/exercise program aimed toward attainment of established weight goal;Weight Loss: Understanding of general recommendations for a balanced deficit meal plan, which promotes 1-2 lb weight loss per week and includes a negative energy balance of [phone] kcal/d;Understanding recommendations for meals to include 15-35% energy as protein, 25-35% energy from fat, 35-60% energy from carbohydrates, less than 200mg  of dietary cholesterol, 20-35 gm of total fiber daily;Understanding of distribution of calorie intake throughout the day with the consumption of 4-5 meals/snacks    Improve shortness of breath with ADL's Yes    Intervention Provide education, individualized exercise plan and daily activity instruction to help decrease symptoms of SOB with activities of daily living.    Expected Outcomes Short Term: Improve cardiorespiratory fitness to achieve a reduction of symptoms when performing ADLs;Long Term: Be able to perform more ADLs  without symptoms or delay the onset of symptoms    Diabetes Yes  Intervention Provide education about signs/symptoms and action to take for hypo/hyperglycemia.;Provide education about proper nutrition, including hydration, and aerobic/resistive exercise prescription along with prescribed medications to achieve blood glucose in normal ranges: Fasting glucose 65-99 mg/dL    Expected Outcomes Short Term: Participant verbalizes understanding of the signs/symptoms and immediate care of hyper/hypoglycemia, proper foot care and importance of medication, aerobic/resistive exercise and nutrition plan for blood glucose control.;Long Term: Attainment of HbA1C < 7%.    Hypertension Yes    Intervention Provide education on lifestyle modifcations including regular physical activity/exercise, weight management, moderate sodium restriction and increased consumption of fresh fruit, vegetables, and low fat dairy, alcohol moderation, and smoking cessation.;Monitor prescription use compliance.    Expected Outcomes Short Term: Continued assessment and intervention until BP is < 140/40mm HG in hypertensive participants. < 130/76mm HG in hypertensive participants with diabetes, heart failure or chronic kidney disease.;Long Term: Maintenance of blood pressure at goal levels.    Lipids Yes    Intervention Provide education and support for participant on nutrition & aerobic/resistive exercise along with prescribed medications to achieve LDL 70mg , HDL >40mg .    Expected Outcomes Long Term: Cholesterol controlled with medications as prescribed, with individualized exercise RX and with personalized nutrition plan. Value goals: LDL < 70mg , HDL > 40 mg.;Short Term: Participant states understanding of desired cholesterol values and is compliant with medications prescribed. Participant is following exercise prescription and nutrition guidelines.          Core Components/Risk Factors/Patient Goals Review:    Core Components/Risk  Factors/Patient Goals at Discharge (Final Review):    ITP Comments:  ITP Comments     Row Name 11/03/23 0956 11/04/23 0949 11/25/23 1424       ITP Comments Patient attend orientation today.  Patient is attending [facility] Program.  Documentation for diagnosis can be found in [facility] 08/19/23.  Reviewed medical chart, RPE/RPD, gym safety, and program guidelines.  Patient was fitted to equipment they will be using during rehab.  Patient is scheduled to start exercise on Wednesday 11/04/23 at 915.   Initial ITP created and sent for review and signature by Dr. Dorn Ross, Medical Director for Cardiac Rehabilitation Program. First full day of exercise!  Patient was oriented to gym and equipment including functions, settings, policies, and procedures.  Patient's individual exercise prescription and treatment plan were reviewed.  All starting workloads were established based on the results of the 6 minute walk test done at initial orientation visit.  The plan for exercise progression was also introduced and progression will be customized based on patient's performance and goals. 30 day review completed. ITP sent to Dr. Dorn Ross, Medical Director of Cardiac Rehab. Continue with ITP unless changes are made by physician.  Still newer to program and has been out sick recently.        Comments: 30 day review

## 2023-11-27 ENCOUNTER — Telehealth (HOSPITAL_COMMUNITY): Payer: Self-pay

## 2023-11-27 ENCOUNTER — Encounter (HOSPITAL_COMMUNITY)

## 2023-11-27 NOTE — Telephone Encounter (Signed)
 Called Mrs. Lave due to missing appointment. She stated that she was still sick. She stated that she has gone to see her PCP. Told her we will see her next week and she said she will try. Will follow up next week if she does not return.

## 2023-11-30 ENCOUNTER — Encounter (HOSPITAL_COMMUNITY)

## 2023-11-30 ENCOUNTER — Telehealth (HOSPITAL_COMMUNITY): Payer: Self-pay

## 2023-12-02 ENCOUNTER — Encounter (HOSPITAL_COMMUNITY)

## 2023-12-02 ENCOUNTER — Ambulatory Visit (HOSPITAL_COMMUNITY)
Admission: RE | Admit: 2023-12-02 | Discharge: 2023-12-02 | Disposition: A | Discharge status: Home / Self Care | Source: Ambulatory Visit | Attending: Cardiovascular Disease | Admitting: Cardiovascular Disease

## 2023-12-02 DIAGNOSIS — I25118 Atherosclerotic heart disease of native coronary artery with other forms of angina pectoris: Secondary | ICD-10-CM | POA: Diagnosis not present

## 2023-12-02 MED ORDER — PERFLUTREN LIPID MICROSPHERE
1.0000 mL | INTRAVENOUS | Status: AC | PRN
  Administered 2023-12-02: 2 mL via INTRAVENOUS

## 2023-12-03 LAB — ECHOCARDIOGRAM COMPLETE
AR max vel: 1.12 cm2
AV Area VTI: 1.05 cm2
AV Area mean vel: 1.05 cm2
AV Mean grad: 15 mmHg
AV Peak grad: 26 mmHg
Ao pk vel: 2.55 m/s
Area-P 1/2: 3.63 cm2
MV VTI: 1.36 cm2
S' Lateral: 2.52 cm

## 2023-12-04 ENCOUNTER — Encounter (HOSPITAL_COMMUNITY)

## 2023-12-06 ENCOUNTER — Ambulatory Visit: Payer: Self-pay | Admitting: Cardiovascular Disease

## 2023-12-07 ENCOUNTER — Telehealth (HOSPITAL_COMMUNITY): Payer: Self-pay

## 2023-12-07 ENCOUNTER — Encounter (HOSPITAL_COMMUNITY)

## 2023-12-07 NOTE — Telephone Encounter (Signed)
 Called pt regarding missing her appointment this morning. She stated that she still feels weak from being sick and having some fluid on her lung. She stated that she is planing on coming back this wednesday.

## 2023-12-09 ENCOUNTER — Encounter (HOSPITAL_COMMUNITY)

## 2023-12-11 ENCOUNTER — Encounter (HOSPITAL_COMMUNITY)

## 2023-12-11 ENCOUNTER — Telehealth (HOSPITAL_COMMUNITY): Payer: Self-pay

## 2023-12-14 ENCOUNTER — Encounter (HOSPITAL_COMMUNITY)

## 2023-12-15 DIAGNOSIS — E1122 Type 2 diabetes mellitus with diabetic chronic kidney disease: Secondary | ICD-10-CM | POA: Diagnosis not present

## 2023-12-15 DIAGNOSIS — E782 Mixed hyperlipidemia: Secondary | ICD-10-CM | POA: Diagnosis not present

## 2023-12-16 ENCOUNTER — Encounter (HOSPITAL_COMMUNITY)

## 2023-12-16 ENCOUNTER — Encounter (HOSPITAL_COMMUNITY): Payer: Self-pay | Admitting: *Deleted

## 2023-12-16 DIAGNOSIS — Z955 Presence of coronary angioplasty implant and graft: Secondary | ICD-10-CM

## 2023-12-18 ENCOUNTER — Encounter (HOSPITAL_COMMUNITY)

## 2023-12-21 ENCOUNTER — Encounter (HOSPITAL_COMMUNITY)

## 2023-12-21 ENCOUNTER — Telehealth (HOSPITAL_COMMUNITY): Payer: Self-pay

## 2023-12-23 ENCOUNTER — Encounter (HOSPITAL_COMMUNITY): Payer: Self-pay | Admitting: *Deleted

## 2023-12-23 ENCOUNTER — Encounter (HOSPITAL_COMMUNITY)

## 2023-12-23 DIAGNOSIS — Z955 Presence of coronary angioplasty implant and graft: Secondary | ICD-10-CM

## 2023-12-23 NOTE — Progress Notes (Signed)
 Cardiac Individual Treatment Plan  Patient Details  Name: Miranda Fuller MRN: [id number] Date of Birth: [birth year] Referring Provider:   Flowsheet Row CARDIAC REHAB PHASE II ORIENTATION from 11/03/2023 in [facility]  Referring Provider Verlin Bruckner MD    Initial Encounter Date:  Flowsheet Row CARDIAC REHAB PHASE II ORIENTATION from 11/03/2023 in [address] [facility]  Date 11/03/23    Visit Diagnosis: Status post coronary artery stent placement  Patient's Home Medications on Admission:  Current Outpatient Medications:    acetaminophen  (TYLENOL ) 325 MG tablet, Take 500 mg by mouth every 6 (six) hours as needed., Disp: , Rfl:    anastrozole  (ARIMIDEX ) 1 MG tablet, Take 1 tablet (1 mg total) by mouth daily., Disp: 45 tablet, Rfl: 3   aspirin  81 MG tablet, Take 81 mg by mouth daily., Disp: , Rfl:    calcium carbonate (OS-CAL) 1250 (500 Ca) MG chewable tablet, Chew 1 tablet by mouth daily., Disp: , Rfl:    Calcium Carbonate-Vit D-Min (CALCIUM 1200 PO), Take 1,200 mg by mouth 2 (two) times daily., Disp: , Rfl:    Cholecalciferol (VITAMIN D -3) 25 MCG (1000 UT) CAPS, Take 3,000 Units by mouth daily., Disp: , Rfl:    clopidogrel  (PLAVIX ) 75 MG tablet, Take 1 tablet (75 mg total) by mouth daily with breakfast., Disp: 90 tablet, Rfl: 2   cyanocobalamin  (VITAMIN B12) 1000 MCG tablet, Take 1,000 mcg by mouth 2 (two) times daily., Disp: , Rfl:    ezetimibe  (ZETIA ) 10 MG tablet, Take 10 mg by mouth daily., Disp: , Rfl:    insulin  aspart (NOVOLOG ) 100 UNIT/ML injection, Inject 5-15 Units into the skin 3 (three) times daily with meals. 5-15 units on sliding scale , Disp: , Rfl:    insulin  glargine (LANTUS ) 100 UNIT/ML injection, Inject 45 Units into the skin at bedtime., Disp: , Rfl:    lisinopril -hydrochlorothiazide  (ZESTORETIC ) 10-12.5 MG tablet, Take 1 tablet by mouth daily., Disp: , Rfl:    metoprolol  succinate (TOPROL  XL) 25 MG 24 hr tablet, Take 1 tablet  (25 mg total) by mouth daily., Disp: 30 tablet, Rfl: 2   Multiple Vitamins-Minerals (MULTIVITAMIN ADULT PO), Take 1 tablet by mouth daily., Disp: , Rfl:    nitroGLYCERIN  (NITROSTAT ) 0.4 MG SL tablet, Place 1 tablet (0.4 mg total) under the tongue every 5 (five) minutes as needed., Disp: 25 tablet, Rfl: 2   Semaglutide , 1 MG/DOSE, (OZEMPIC , 1 MG/DOSE,) 4 MG/3ML SOPN, Inject 1 mg into the skin once a week., Disp: , Rfl:    venlafaxine  XR (EFFEXOR -XR) 150 MG 24 hr capsule, Take 150 mg by mouth daily., Disp: , Rfl:   Past Medical History: Past Medical History:  Diagnosis Date   Breast mass 12/27/2018   right- cancer, s/p lumpectomy, radiation   Coccidioidomycosis, pulmonary    Complication of anesthesia    DDD (degenerative disc disease), cervical    Dementia (HCC)    Early per Dr. Vonzell   Depression 2009   DM (diabetes mellitus screen) 2009   Family history of bone cancer    Family history of brain cancer    Family history of breast cancer    Family history of esophageal cancer    Family history of lung cancer    Glaucoma    HTN (hypertension)    Morbid obesity (HCC)    Nodule of upper lobe of left lung 12/27/2018   Personal history of radiation therapy    PONV (postoperative nausea and vomiting)    Sleep apnea  had surgery   Type II diabetes mellitus, uncontrolled 01/31/2019    Tobacco Use: Social History   Tobacco Use  Smoking Status Never  Smokeless Tobacco Never    Labs: Review Flowsheet       Latest Ref Rng & Units 01/29/2010 01/30/2010  Labs for ITP Cardiac and Pulmonary Rehab  Cholestrol 0 - 200 mg/dL - 815        ATP III CLASSIFICATION:  <200     mg/dL   Desirable  799-760  mg/dL   Borderline High  >=759    mg/dL   High          LDL (calc) 0 - 99 mg/dL - 78        Total Cholesterol/HDL:CHD Risk Coronary Heart Disease Risk Table                     Men   Women  1/2 Average Risk   3.4   3.3  Average Risk       5.0   4.4  2 X Average Risk   9.6    7.1  3 X Average Risk  23.4   11.0        Use the calculated Patient Ratio above and the CHD Risk Table to determine the patient's CHD Risk.        ATP III CLASSIFICATION (LDL):  <100     mg/dL   Optimal  899-870  mg/dL   Near or Above                    Optimal  130-159  mg/dL   Borderline  839-810  mg/dL   High  >809     mg/dL   Very High   HDL-C >60 mg/dL - 44   Trlycerides <849 mg/dL - 691   Hemoglobin J8r <5.7 % 11.7 (NOTE)                                                                       According to the ADA Clinical Practice Recommendations for 2011, when HbA1c is used as a screening test:   >=6.5%   Diagnostic of Diabetes Mellitus           (if abnormal result  is confirmed)  5.7-6.4%   Increased risk of developing Diabetes Mellitus  References:Diagnosis and Classification of Diabetes Mellitus,Diabetes Care,2011,34(Suppl 1):S62-S69 and Standards of Medical Care in         Diabetes - 2011,Diabetes Rjmz,7988,65  (Suppl 1):S11-S61.  -    Capillary Blood Glucose: Lab Results  Component Value Date   GLUCAP 115 (H) 11/06/2023   GLUCAP 157 (H) 11/06/2023   GLUCAP 182 (H) 11/04/2023   GLUCAP 149 (H) 08/19/2023   GLUCAP 117 (H) 08/19/2023     Exercise Target Goals: Exercise Program Goal: Individual exercise prescription set using results from initial 6 min walk test and THRR while considering  patient's activity barriers and safety.   Exercise Prescription Goal: Starting with aerobic activity 30 plus minutes a day, 3 days per week for initial exercise prescription. Provide home exercise prescription and guidelines that participant acknowledges understanding prior to discharge.  Activity Barriers & Risk Stratification:  Activity Barriers & Cardiac Risk Stratification -  11/03/23 9162       Activity Barriers & Cardiac Risk Stratification   Activity Barriers Deconditioning;Muscular Weakness;Shortness of Breath;History of Falls;Balance Concerns;Chest Pain/Angina    Cardiac  Risk Stratification High          6 Minute Walk:  6 Minute Walk     Row Name 11/03/23 1005         6 Minute Walk   Phase Initial     Distance 910 feet     Walk Time 6 minutes     # of Rest Breaks 0     MPH 1.72     METS 2.13     RPE 11     VO2 Peak 7.44     Symptoms Yes (comment)     Comments a little dizzy from turns     Resting HR 73 bpm     Resting BP 126/70     Resting Oxygen Saturation  96 %     Exercise Oxygen Saturation  during 6 min walk 96 %     Max Ex. HR 113 bpm     Max Ex. BP 194/84     2 Minute Post BP 186/84        Oxygen Initial Assessment:   Oxygen Re-Evaluation:   Oxygen Discharge (Final Oxygen Re-Evaluation):   Initial Exercise Prescription:  Initial Exercise Prescription - 11/03/23 1000       Date of Initial Exercise RX and Referring Provider   Date 11/03/23    Referring Provider Verlin Bruckner MD      Oxygen   Maintain Oxygen Saturation 88% or higher      Treadmill   MPH 1.2    Grade 0.5    Minutes 15    METs 2      NuStep   Level 1    SPM 80    Minutes 15    METs 2      Prescription Details   Frequency (times per week) 3    Duration Progress to 30 minutes of continuous aerobic without signs/symptoms of physical distress      Intensity   THRR 40-80% of Max Heartrate 103-134    Ratings of Perceived Exertion 11-13    Perceived Dyspnea 0-4      Progression   Progression Continue to progress workloads to maintain intensity without signs/symptoms of physical distress.      Resistance Training   Training Prescription Yes    Weight 3 lb    Reps 10-15          Perform Capillary Blood Glucose checks as needed.  Exercise Prescription Changes:   Exercise Prescription Changes     Row Name 11/03/23 1000             Response to Exercise   Blood Pressure (Admit) 126/70       Blood Pressure (Exercise) 194/84       Blood Pressure (Exit) 162/74       Heart Rate (Admit) 73 bpm       Heart Rate (Exercise)  113 bpm       Heart Rate (Exit) 79 bpm       Oxygen Saturation (Admit) 96 %       Oxygen Saturation (Exercise) 96 %       Rating of Perceived Exertion (Exercise) 11       Symptoms a little dizzy from turns       Comments walk test results  Exercise Comments:   Exercise Comments     Row Name 11/04/23 1256           Exercise Comments First full day of exercise!  Patient was oriented to gym and equipment including functions, settings, policies, and procedures.  Patient's individual exercise prescription and treatment plan were reviewed.  All starting workloads were established based on the results of the 6 minute walk test done at initial orientation visit.  The plan for exercise progression was also introduced and progression will be customized based on patient's performance and goals.          Exercise Goals and Review:   Exercise Goals     Row Name 11/03/23 1007             Exercise Goals   Increase Physical Activity Yes       Intervention Provide advice, education, support and counseling about physical activity/exercise needs.;Develop an individualized exercise prescription for aerobic and resistive training based on initial evaluation findings, risk stratification, comorbidities and participant's personal goals.       Expected Outcomes Short Term: Attend rehab on a regular basis to increase amount of physical activity.;Long Term: Exercising regularly at least 3-5 days a week.;Long Term: Add in home exercise to make exercise part of routine and to increase amount of physical activity.       Increase Strength and Stamina Yes       Intervention Provide advice, education, support and counseling about physical activity/exercise needs.;Develop an individualized exercise prescription for aerobic and resistive training based on initial evaluation findings, risk stratification, comorbidities and participant's personal goals.       Expected Outcomes Short Term: Increase  workloads from initial exercise prescription for resistance, speed, and METs.;Short Term: Perform resistance training exercises routinely during rehab and add in resistance training at home;Long Term: Improve cardiorespiratory fitness, muscular endurance and strength as measured by increased METs and functional capacity ( )       Able to understand and use rate of perceived exertion (RPE) scale Yes       Intervention Provide education and explanation on how to use RPE scale       Expected Outcomes Short Term: Able to use RPE daily in rehab to express subjective intensity level;Long Term:  Able to use RPE to guide intensity level when exercising independently       Able to understand and use Dyspnea scale Yes       Intervention Provide education and explanation on how to use Dyspnea scale       Expected Outcomes Short Term: Able to use Dyspnea scale daily in rehab to express subjective sense of shortness of breath during exertion;Long Term: Able to use Dyspnea scale to guide intensity level when exercising independently       Knowledge and understanding of Target Heart Rate Range (THRR) Yes       Intervention Provide education and explanation of THRR including how the numbers were predicted and where they are located for reference       Expected Outcomes Short Term: Able to state/look up THRR;Long Term: Able to use THRR to govern intensity when exercising independently;Short Term: Able to use daily as guideline for intensity in rehab       Able to check pulse independently Yes       Intervention Provide education and demonstration on how to check pulse in carotid and radial arteries.;Review the importance of being able to check your own pulse for safety during independent exercise  Expected Outcomes Short Term: Able to explain why pulse checking is important during independent exercise;Long Term: Able to check pulse independently and accurately       Understanding of Exercise Prescription Yes        Intervention Provide education, explanation, and written materials on patient's individual exercise prescription       Expected Outcomes Short Term: Able to explain program exercise prescription;Long Term: Able to explain home exercise prescription to exercise independently          Exercise Goals Re-Evaluation :  Exercise Goals Re-Evaluation     Row Name 11/04/23 1257             Exercise Goal Re-Evaluation   Exercise Goals Review Understanding of Exercise Prescription;Able to understand and use Dyspnea scale;Able to understand and use rate of perceived exertion (RPE) scale;Knowledge and understanding of Target Heart Rate Range (THRR)       Comments Reviewed RPE and dyspnea scale, THR and program prescription with pt today.  Pt voiced understanding and was given a copy of goals to take home.       Expected Outcomes Short: Use RPE daily to regulate intensity.  Long: Follow program prescription in THR.           Discharge Exercise Prescription (Final Exercise Prescription Changes):  Exercise Prescription Changes - 11/03/23 1000       Response to Exercise   Blood Pressure (Admit) 126/70    Blood Pressure (Exercise) 194/84    Blood Pressure (Exit) 162/74    Heart Rate (Admit) 73 bpm    Heart Rate (Exercise) 113 bpm    Heart Rate (Exit) 79 bpm    Oxygen Saturation (Admit) 96 %    Oxygen Saturation (Exercise) 96 %    Rating of Perceived Exertion (Exercise) 11    Symptoms a little dizzy from turns    Comments walk test results          Nutrition:  Target Goals: Understanding of nutrition guidelines, daily intake of sodium 1500mg , cholesterol 200mg , calories 30% from fat and 7% or less from saturated fats, daily to have 5 or more servings of fruits and vegetables.  Biometrics:  Pre Biometrics - 11/03/23 1008       Pre Biometrics   Height 5' (1.524 m)    Weight 201 lb 14.4 oz (91.6 kg)    Waist Circumference 38 inches    Hip Circumference 41 inches    Waist to Hip  Ratio 0.93 %    BMI (Calculated) 39.43    Grip Strength 21.1 kg    Single Leg Stand 30 seconds           Nutrition Therapy Plan and Nutrition Goals:  Nutrition Therapy & Goals - 11/03/23 0846       Intervention Plan   Intervention Prescribe, educate and counsel regarding individualized specific dietary modifications aiming towards targeted core components such as weight, hypertension, lipid management, diabetes, heart failure and other comorbidities.;Nutrition handout(s) given to patient.    Expected Outcomes Short Term Goal: Understand basic principles of dietary content, such as calories, fat, sodium, cholesterol and nutrients.;Long Term Goal: Adherence to prescribed nutrition plan.          Nutrition Assessments:  MEDIFICTS Score Key: >=70 Need to make dietary changes  40-70 Heart Healthy Diet <= 40 Therapeutic Level Cholesterol Diet  Flowsheet Row CARDIAC REHAB PHASE II ORIENTATION from 11/03/2023 in [facility]  Picture Your Plate Total Score on Admission 68  Picture Your Plate Scores: <59 Unhealthy dietary pattern with much room for improvement. 41-50 Dietary pattern unlikely to meet recommendations for good health and room for improvement. 51-60 More healthful dietary pattern, with some room for improvement.  >60 Healthy dietary pattern, although there may be some specific behaviors that could be improved.    Nutrition Goals Re-Evaluation:   Nutrition Goals Discharge (Final Nutrition Goals Re-Evaluation):   Psychosocial: Target Goals: Acknowledge presence or absence of significant depression and/or stress, maximize coping skills, provide positive support system. Participant is able to verbalize types and ability to use techniques and skills needed for reducing stress and depression.  Initial Review & Psychosocial Screening:  Initial Psych Review & Screening - 11/03/23 0842       Initial Review   Current issues with Current  Depression;Current Psychotropic Meds;Current Sleep Concerns;Current Stress Concerns    Source of Stress Concerns Chronic Illness;Unable to participate in former interests or hobbies;Unable to perform yard/household activities    Comments sleep good some days and not other days, lives alone, generic effexor  has crazy dreams when she misses a dose and plans to talk to doctor about it      Family Dynamics   Good Support System? Yes   oldest daughter lives in town and one daughter in [place]     Barriers   Psychosocial barriers to participate in program Psychosocial barriers identified (see note);The patient should benefit from training in stress management and relaxation.      Screening Interventions   Interventions Encouraged to exercise;To provide support and resources with identified psychosocial needs;Provide feedback about the scores to participant    Expected Outcomes Short Term goal: Utilizing psychosocial counselor, staff and physician to assist with identification of specific Stressors or current issues interfering with healing process. Setting desired goal for each stressor or current issue identified.;Long Term Goal: Stressors or current issues are controlled or eliminated.;Long Term goal: The participant improves quality of Life and PHQ9 Scores as seen by post scores and/or verbalization of changes;Short Term goal: Identification and review with participant of any Quality of Life or Depression concerns found by scoring the questionnaire.          Quality of Life Scores:  Quality of Life - 11/04/23 1256       Quality of Life   Select Quality of Life      Quality of Life Scores   Health/Function Pre 14.57 %    Socioeconomic Pre 21 %    Psych/Spiritual Pre 23.79 %    Family Pre 15.75 %    GLOBAL Pre 18.03 %         Scores of 19 and below usually indicate a poorer quality of life in these areas.  A difference of  2-3 points is a clinically meaningful difference.  A  difference of 2-3 points in the total score of the Quality of Life Index has been associated with significant improvement in overall quality of life, self-image, physical symptoms, and general health in studies assessing change in quality of life.  PHQ-9: Review Flowsheet       11/03/2023  Depression screen PHQ 2/9  Decreased Interest 2  Down, Depressed, Hopeless 3  PHQ - 2 Score 5  Altered sleeping 3  Tired, decreased energy 3  Change in appetite 2  Feeling bad or failure about yourself  0  Trouble concentrating 0  Moving slowly or fidgety/restless 0  Suicidal thoughts 0  PHQ-9 Score 13  Difficult doing work/chores Very difficult  Interpretation of Total Score  Total Score Depression Severity:  1-4 = Minimal depression, 5-9 = Mild depression, 10-14 = Moderate depression, 15-19 = Moderately severe depression, 20-27 = Severe depression   Psychosocial Evaluation and Intervention:  Psychosocial Evaluation - 11/03/23 1008       Psychosocial Evaluation & Interventions   Interventions Stress management education;Relaxation education;Encouraged to exercise with the program and follow exercise prescription    Comments Coralee is coming into cardiac rehab after a stent.  Prior to her stent she had had a syncopal episode and they believe the two may be linked.  She has also been experiencing high blood pressure reading recently and plans to discuss with her PCP tomorrow.  This will be her last appt with current PCP as he is retiring and she will be sad to see him go. They are also going to look at adding in renexa with her ongoing intermitten chest pain.  She has a history of bilateral breast cancer and a lung nodule.  She has not been very active recently and looking forward to getting up and moving again. She was to feel better and build up her energy.  She is also hoping that it will help her sleep better.  She does also note a history of depression and takes generic effexor  but finds that if  she misses a dose she will have very weird and vivid dreams.  She plans to discuss this with her doctor as well.  She does not preceive any barriers to attending rehab at this time.  She lives alone but has two daughters; one lives in [place] and one in [place] so they are nearby.  Her biggest concern is making sure her sugar stays where it needs to be and she will check it to make sure she is good to go.    Expected Outcomes Short; Talk to doctor about meds Long: Attend rehab to build up stamina    Continue Psychosocial Services  Follow up required by staff          Psychosocial Re-Evaluation:   Psychosocial Discharge (Final Psychosocial Re-Evaluation):   Vocational Rehabilitation: Provide vocational rehab assistance to qualifying candidates.   Vocational Rehab Evaluation & Intervention:  Vocational Rehab - 11/03/23 0842       Initial Vocational Rehab Evaluation & Intervention   Assessment shows need for Vocational Rehabilitation No   retired         Education: Education Goals: Education classes will be provided on a weekly basis, covering required topics. Participant will state understanding/return demonstration of topics presented.  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 Discus how high blood pressure affects the body and ways to reduce high blood pressure.   Exercise and Your Heart -Discuss why it is important to exercise, the FITT principles of exercise, normal and abnormal responses to exercise, and how to exercise safely.   Angina -Discuss definition of angina, causes of angina, treatment of angina,  and how to decrease risk of having angina.   Cardiac Medications -Review what the following cardiac medications are used for, how they affect the body, and side effects that may occur when taking the medications.  Medications  include Aspirin , Beta blockers, calcium channel blockers, ACE Inhibitors, angiotensin receptor blockers, diuretics, digoxin, and antihyperlipidemics.   Congestive Heart Failure -Discuss the definition of CHF, how to live with CHF, the signs and symptoms of CHF, and how keep track of weight and sodium intake.   Heart Disease and Intimacy -Discus the effect sexual activity has on the heart, how changes occur during intimacy as we age, and safety during sexual activity.   Smoking Cessation / COPD -Discuss different methods to quit smoking, the health benefits of quitting smoking, and the definition of COPD.   Nutrition I: Fats -Discuss the types of cholesterol, what cholesterol does to the heart, and how cholesterol levels can be controlled.   Nutrition II: Labels -Discuss the different components of food labels and how to read food label   Heart Parts/Heart Disease and PAD -Discuss the anatomy of the heart, the pathway of blood circulation through the heart, and these are affected by heart disease.   Stress I: Signs and Symptoms -Discuss the causes of stress, how stress may lead to anxiety and depression, and ways to limit stress.   Stress II: Relaxation -Discuss different types of relaxation techniques to limit stress.   Warning Signs of Stroke / TIA -Discuss definition of a stroke, what the signs and symptoms are of a stroke, and how to identify when someone is having stroke.   Knowledge Questionnaire Score:  Knowledge Questionnaire Score - 11/04/23 1256       Knowledge Questionnaire Score   Pre Score 25/26          Core Components/Risk Factors/Patient Goals at Admission:  Personal Goals and Risk Factors at Admission - 11/03/23 1015        Core Components/Risk Factors/Patient Goals on Admission    Weight Management Yes;Obesity;Weight Loss    Intervention Weight Management: Develop a combined nutrition and exercise program designed to reach desired caloric intake, while maintaining appropriate intake of nutrient and fiber, sodium and fats, and appropriate energy expenditure required for the weight goal.;Weight Management: Provide education and appropriate resources to help participant work on and attain dietary goals.;Weight Management/Obesity: Establish reasonable short term and long term weight goals.;Obesity: Provide education and appropriate resources to help participant work on and attain dietary goals.    Admit Weight 201 lb 14.4 oz (91.6 kg)    Goal Weight: Short Term 195 lb (88.5 kg)    Goal Weight: Long Term 190 lb (86.2 kg)    Expected Outcomes Short Term: Continue to assess and modify interventions until short term weight is achieved;Long Term: Adherence to nutrition and physical activity/exercise program aimed toward attainment of established weight goal;Weight Loss: Understanding of general recommendations for a balanced deficit meal plan, which promotes 1-2 lb weight loss per week and includes a negative energy balance of [phone] kcal/d;Understanding recommendations for meals to include 15-35% energy as protein, 25-35% energy from fat, 35-60% energy from carbohydrates, less than 200mg  of dietary cholesterol, 20-35 gm of total fiber daily;Understanding of distribution of calorie intake throughout the day with the consumption of 4-5 meals/snacks    Improve shortness of breath with ADL's Yes    Intervention Provide education, individualized exercise plan and daily activity instruction to help decrease symptoms of SOB with activities of daily living.    Expected Outcomes Short Term: Improve cardiorespiratory fitness to achieve a reduction of symptoms when performing ADLs;Long Term: Be able to perform more ADLs without symptoms or  delay the onset of symptoms    Diabetes Yes  Intervention Provide education about signs/symptoms and action to take for hypo/hyperglycemia.;Provide education about proper nutrition, including hydration, and aerobic/resistive exercise prescription along with prescribed medications to achieve blood glucose in normal ranges: Fasting glucose 65-99 mg/dL    Expected Outcomes Short Term: Participant verbalizes understanding of the signs/symptoms and immediate care of hyper/hypoglycemia, proper foot care and importance of medication, aerobic/resistive exercise and nutrition plan for blood glucose control.;Long Term: Attainment of HbA1C < 7%.    Hypertension Yes    Intervention Provide education on lifestyle modifcations including regular physical activity/exercise, weight management, moderate sodium restriction and increased consumption of fresh fruit, vegetables, and low fat dairy, alcohol moderation, and smoking cessation.;Monitor prescription use compliance.    Expected Outcomes Short Term: Continued assessment and intervention until BP is < 140/67mm HG in hypertensive participants. < 130/27mm HG in hypertensive participants with diabetes, heart failure or chronic kidney disease.;Long Term: Maintenance of blood pressure at goal levels.    Lipids Yes    Intervention Provide education and support for participant on nutrition & aerobic/resistive exercise along with prescribed medications to achieve LDL 70mg , HDL >40mg .    Expected Outcomes Long Term: Cholesterol controlled with medications as prescribed, with individualized exercise RX and with personalized nutrition plan. Value goals: LDL < 70mg , HDL > 40 mg.;Short Term: Participant states understanding of desired cholesterol values and is compliant with medications prescribed. Participant is following exercise prescription and nutrition guidelines.          Core Components/Risk Factors/Patient Goals Review:    Core Components/Risk Factors/Patient  Goals at Discharge (Final Review):    ITP Comments:  ITP Comments     Row Name 11/03/23 0956 11/04/23 0949 11/25/23 1424 12/16/23 0924 12/23/23 0945   ITP Comments Patient attend orientation today.  Patient is attending Cardiac Rehabilitation Program.  Documentation for diagnosis can be found in [facility] 08/19/23.  Reviewed medical chart, RPE/RPD, gym safety, and program guidelines.  Patient was fitted to equipment they will be using during rehab.  Patient is scheduled to start exercise on Wednesday 11/04/23 at 915.   Initial ITP created and sent for review and signature by Dr. Dorn Ross, Medical Director for Cardiac Rehabilitation Program. First full day of exercise!  Patient was oriented to gym and equipment including functions, settings, policies, and procedures.  Patient's individual exercise prescription and treatment plan were reviewed.  All starting workloads were established based on the results of the 6 minute walk test done at initial orientation visit.  The plan for exercise progression was also introduced and progression will be customized based on patient's performance and goals. 30 day review completed. ITP sent to Dr. Dorn Ross, Medical Director of Cardiac Rehab. Continue with ITP unless changes are made by physician.  Still newer to program and has been out sick recently. Izella called to let us  know that she is still intolerant to her new meds.  She is very dizzy and lightheaded and feels unable to drive.  She was encouraged to call the cardiology office again to let them know she is still feeling this way. 30 day review completed. ITP sent to Dr. Dorn Ross, Medical Director of Cardiac Rehab. Continue with ITP unless changes are made by physician.  Pt has been out sick, no goals obtained.      Comments: 30 day review

## 2023-12-25 ENCOUNTER — Encounter (HOSPITAL_COMMUNITY)

## 2023-12-28 ENCOUNTER — Telehealth (HOSPITAL_COMMUNITY): Payer: Self-pay

## 2023-12-28 ENCOUNTER — Encounter (HOSPITAL_COMMUNITY)
Admission: RE | Admit: 2023-12-28 | Discharge: 2023-12-28 | Disposition: A | Discharge status: Home / Self Care | Source: Ambulatory Visit | Attending: Cardiovascular Disease | Admitting: Cardiovascular Disease

## 2023-12-28 DIAGNOSIS — Z955 Presence of coronary angioplasty implant and graft: Secondary | ICD-10-CM | POA: Insufficient documentation

## 2023-12-28 NOTE — Telephone Encounter (Addendum)
 Called patient regarding attendance at cardiac rehab. She continues to complain of dizziness and fell last week on her porch. She now has hip and shoulder pain and is using a walker. She has not contacted her cardiologist regarding the dizziness because she says she has had dizziness for a long time and does not contribute it to any medication change. She does plan to return but does not know when.

## 2023-12-30 ENCOUNTER — Encounter (HOSPITAL_COMMUNITY)

## 2024-01-01 ENCOUNTER — Encounter (HOSPITAL_COMMUNITY)

## 2024-01-04 ENCOUNTER — Encounter (HOSPITAL_COMMUNITY)

## 2024-01-05 ENCOUNTER — Ambulatory Visit: Admitting: Cardiovascular Disease

## 2024-01-05 NOTE — Progress Notes (Deleted)
 No chief complaint on file.   History of Present Illness: 73 yo female with history of CAD, breast cancer, DM, HTN, obesity, sleep apnea and mild dementia who is here today for follow up. She was seen as a new patient in 2020 for the evaluation of chest pain. She had a cardiac cath in 2011 which showed mild luminal irregularities but no obstructive CAD. Exercise stress test in 2016 with no ischemic EKG changes. Echo December 2019 with LVEF=60-65%, sclerotic aortic valve without stenosis. Coronary CTA in March 2020 with mild to moderate non-obstructive disease in the LAD and Circumflex with probable flow limiting disease in the small caliber obtuse marginal branch (small caliber vessel). I saw her in April 2025 and she c/o left sided chest pain with exertion or at rest associated with dyspnea in addition to a syncopal episode in November 2024 and near syncope in March 2025. Coronary CTA April 2025 with moderate LAD stenosis and severe stenosis in the small caliber OM branch. Cardiac monitor May 2025 with sinus with SVT and VT. Toprol  was started. Cardiac cath 08/19/23 with severe stenosis in the first obtuse marginal branch treated with a drug eluting stent. Moderate non-obstructive disease in the LAD. Echo 12/02/23 with LVEF=65-70%. Mild mitral stenosis. Moderate aortic stenosis with mean gradient 15 mmHg.   She is here today for follow up. The patient denies any, dyspnea, palpitations, lower extremity edema, orthopnea, PND, dizziness, near syncope or syncope. *** Rare chest pains.     Primary Care Physician: Atilano Deward ORN, MD   Past Medical History:  Diagnosis Date   Breast mass 12/27/2018   right- cancer, s/p lumpectomy, radiation   Coccidioidomycosis, pulmonary    Complication of anesthesia    DDD (degenerative disc disease), cervical    Dementia (HCC)    Early per Dr. Vonzell   Depression 2009   DM (diabetes mellitus screen) 2009   Family history of bone cancer    Family history of brain  cancer    Family history of breast cancer    Family history of esophageal cancer    Family history of lung cancer    Glaucoma    HTN (hypertension)    Morbid obesity (HCC)    Nodule of upper lobe of left lung 12/27/2018   Personal history of radiation therapy    PONV (postoperative nausea and vomiting)    Sleep apnea    had surgery   Type II diabetes mellitus, uncontrolled 01/31/2019    Past Surgical History:  Procedure Laterality Date   ABDOMINAL HYSTERECTOMY     TAH BSO   BREAST BIOPSY Right 02/07/2019   BREAST LUMPECTOMY Right 04/28/2019   BREAST LUMPECTOMY WITH RADIOACTIVE SEED AND SENTINEL LYMPH NODE BIOPSY Left 03/22/2021   Procedure: LEFT BREAST LUMPECTOMY WITH RADIOACTIVE SEED AND SENTINEL LYMPH NODE BIOPSY;  Surgeon: Curvin Deward MOULD, MD;  Location: [facility];  Service: General;  Laterality: Left;   BREAST LUMPECTOMY WITH RADIOACTIVE SEED LOCALIZATION Right 04/28/2019   Procedure: RIGHT BREAST LUMPECTOMY WITH RADIOACTIVE SEED LOCALIZATION;  Surgeon: Curvin Deward MOULD, MD;  Location: [facility];  Service: General;  Laterality: Right;   CHOLECYSTECTOMY     CORONARY ANGIOGRAPHY N/A 08/19/2023   Procedure: CORONARY ANGIOGRAPHY;  Surgeon: Verlin Lonni BIRCH, MD;  Location: [facility];  Service: Cardiovascular;  Laterality: N/A;   CORONARY STENT INTERVENTION N/A 08/19/2023   Procedure: CORONARY STENT INTERVENTION;  Surgeon: Verlin Lonni BIRCH, MD;  Location: [facility];  Service: Cardiovascular;  Laterality: N/A;   LAPAROSCOPIC GASTRIC SLEEVE RESECTION     TUBAL LIGATION     UVULOPALATOPHARYNGOPLASTY      Current Outpatient Medications  Medication Sig Dispense Refill   acetaminophen  (TYLENOL ) 325 MG tablet Take 500 mg by mouth every 6 (six) hours as needed.     anastrozole  (ARIMIDEX ) 1 MG tablet Take 1 tablet (1 mg total) by mouth daily. 45 tablet 3   aspirin  81 MG tablet Take 81 mg by mouth daily.     calcium carbonate (OS-CAL)  1250 (500 Ca) MG chewable tablet Chew 1 tablet by mouth daily.     Calcium Carbonate-Vit D-Min (CALCIUM 1200 PO) Take 1,200 mg by mouth 2 (two) times daily.     Cholecalciferol (VITAMIN D -3) 25 MCG (1000 UT) CAPS Take 3,000 Units by mouth daily.     clopidogrel  (PLAVIX ) 75 MG tablet Take 1 tablet (75 mg total) by mouth daily with breakfast. 90 tablet 2   cyanocobalamin  (VITAMIN B12) 1000 MCG tablet Take 1,000 mcg by mouth 2 (two) times daily.     ezetimibe  (ZETIA ) 10 MG tablet Take 10 mg by mouth daily.     insulin  aspart (NOVOLOG ) 100 UNIT/ML injection Inject 5-15 Units into the skin 3 (three) times daily with meals. 5-15 units on sliding scale      insulin  glargine (LANTUS ) 100 UNIT/ML injection Inject 45 Units into the skin at bedtime.     lisinopril -hydrochlorothiazide  (ZESTORETIC ) 10-12.5 MG tablet Take 1 tablet by mouth daily.     metoprolol  succinate (TOPROL  XL) 25 MG 24 hr tablet Take 1 tablet (25 mg total) by mouth daily. 30 tablet 2   Multiple Vitamins-Minerals (MULTIVITAMIN ADULT PO) Take 1 tablet by mouth daily.     nitroGLYCERIN  (NITROSTAT ) 0.4 MG SL tablet Place 1 tablet (0.4 mg total) under the tongue every 5 (five) minutes as needed. 25 tablet 2   Semaglutide , 1 MG/DOSE, (OZEMPIC , 1 MG/DOSE,) 4 MG/3ML SOPN Inject 1 mg into the skin once a week.     venlafaxine  XR (EFFEXOR -XR) 150 MG 24 hr capsule Take 150 mg by mouth daily.     No current facility-administered medications for this visit.    Allergies  Allergen Reactions   Oxycodone-Acetaminophen  Other (See Comments), Itching, Rash and Hives    Other reaction(s): Hallucinations   Propoxyphene Other (See Comments), Hives, Itching and Rash    Other reaction(s): Delusions (intolerance)   Demerol [Meperidine] Other (See Comments)    hallucinations   Morphine And Codeine Hives   Oxycodone-Aspirin  Other (See Comments) and Hives   Chlorhexidine  Rash    Social History   Socioeconomic History   Marital status: Widowed     Spouse name: Not on file   Number of children: 3   Years of education: Not on file   Highest education level: Not on file  Occupational History   Occupation: Retired-Daycare  Tobacco Use   Smoking status: Never   Smokeless tobacco: Never  Vaping Use   Vaping status: Never Used  Substance and Sexual Activity   Alcohol use: Yes    Comment: social   Drug use: No   Sexual activity: Not Currently    Birth control/protection: Surgical  Other Topics Concern   Not on file  Social History Narrative   Lives at home with children. Married for 48 years.      Retired.   Social Drivers of Corporate investment banker Strain: Not on file  Food Insecurity: No Food Insecurity (08/19/2023)  Hunger Vital Sign    Worried About Running Out of Food in the Last Year: Never true    Ran Out of Food in the Last Year: Never true  Transportation Needs: No Transportation Needs (08/20/2023)   PRAPARE - Administrator, Civil Service (Medical): No    Lack of Transportation (Non-Medical): No  Physical Activity: Not on file  Stress: Not on file  Social Connections: Unknown (08/20/2023)   Social Connection and Isolation Panel    Frequency of Communication with Friends and Family: Never    Frequency of Social Gatherings with Friends and Family: Never    Attends Religious Services: Never    Database administrator or Organizations: Yes    Attends Banker Meetings: Patient declined    Marital Status: Patient declined  Intimate Partner Violence: Not At Risk (08/20/2023)   Humiliation, Afraid, Rape, and Kick questionnaire    Fear of Current or Ex-Partner: No    Emotionally Abused: No    Physically Abused: No    Sexually Abused: No    Family History  Problem Relation Age of Onset   Heart disease Mother        enlarged heart and leaking valves   COPD Mother    Diabetes Mother    Breast cancer Mother 33   Cancer Mother        cancer involving the colon, esophagus, and liver    Peripheral vascular disease Father 48   Heart disease Father    Polycystic kidney disease Sister    Polycystic kidney disease Brother    Breast cancer Maternal Grandmother 35   Diabetes Maternal Grandfather    Hypertension Daughter    Breast cancer Other 40       maternal grandmother's sister   Brain cancer Other 68   Lung cancer Other     Review of Systems:  As stated in the HPI and otherwise negative.   There were no vitals taken for this visit.  Physical Examination:  General: Well developed, well nourished, NAD  HEENT: OP clear, mucus membranes moist  SKIN: warm, dry. No rashes. Neuro: No focal deficits  Musculoskeletal: Muscle strength 5/5 all ext  Psychiatric: Mood and affect normal  Neck: No JVD, no carotid bruits, no thyromegaly, no lymphadenopathy.  Lungs:Clear bilaterally, no wheezes, rhonci, crackles Cardiovascular: Regular rate and rhythm. No murmurs, gallops or rubs. Abdomen:Soft. Bowel sounds present. Non-tender.  Extremities: No lower extremity edema. Pulses are 2 + in the bilateral DP/PT.  EKG:  EKG is *** ordered today. The ekg ordered today demonstrates  Recent Labs: 08/20/2023: BUN 20; Creatinine, Ser 0.96; Hemoglobin 11.6; Platelets 247; Potassium 4.0; Sodium 138   Lipids followed in primary care  Wt Readings from Last 3 Encounters:  11/03/23 201 lb 14.4 oz (91.6 kg)  09/11/23 200 lb 6.4 oz (90.9 kg)  08/19/23 202 lb 2.6 oz (91.7 kg)    Assessment and Plan:   1. CAD with stable angina:  Rare episodes of chest pain which are not felt to be cardiac related. Continue ASA, Plavix , metoprolol  and Zetia . *** ? statin  2. Hyperlipidemia: LDL ***  Labs/ tests ordered today include:   No orders of the defined types were placed in this encounter.  Disposition:   F/U with me after cath  Signed, Lonni Cash, MD, [facility] 01/05/2024 6:36 AM    [facility] [address] [place], [place], [place]  [address] Phone: [phone]; Fax:  [phone]

## 2024-01-06 ENCOUNTER — Encounter (HOSPITAL_COMMUNITY)

## 2024-01-08 ENCOUNTER — Telehealth (HOSPITAL_COMMUNITY): Payer: Self-pay

## 2024-01-08 ENCOUNTER — Encounter (HOSPITAL_COMMUNITY)

## 2024-01-11 ENCOUNTER — Encounter (HOSPITAL_COMMUNITY)

## 2024-01-13 ENCOUNTER — Encounter (HOSPITAL_COMMUNITY): Admission: RE | Admit: 2024-01-13 | Source: Ambulatory Visit

## 2024-01-13 ENCOUNTER — Telehealth (HOSPITAL_COMMUNITY): Payer: Self-pay | Admitting: Surgery

## 2024-01-13 NOTE — Telephone Encounter (Signed)
 I called the pt since she missed her cardiac rehab session today.  Pt had recently had the flu and wanted to see if she was feeling better.  VM left for pt to call our office back.

## 2024-01-15 ENCOUNTER — Encounter (HOSPITAL_COMMUNITY)

## 2024-01-18 ENCOUNTER — Encounter (HOSPITAL_COMMUNITY)

## 2024-01-20 ENCOUNTER — Encounter (HOSPITAL_COMMUNITY): Admission: RE | Admit: 2024-01-20 | Source: Ambulatory Visit

## 2024-01-20 ENCOUNTER — Encounter (HOSPITAL_COMMUNITY): Payer: Self-pay

## 2024-01-20 DIAGNOSIS — Z955 Presence of coronary angioplasty implant and graft: Secondary | ICD-10-CM

## 2024-01-20 NOTE — Progress Notes (Signed)
 Cardiac Individual Treatment Plan  Patient Details  Name: Miranda Fuller MRN: [id number] Date of Birth: 11-Jul-1950 Referring Provider:   Flowsheet Row CARDIAC REHAB PHASE II ORIENTATION from 11/03/2023 in [facility]  Referring Provider Verlin Bruckner MD    Initial Encounter Date:  Flowsheet Row CARDIAC REHAB PHASE II ORIENTATION from 11/03/2023 in [facility]  Date 11/03/23    Visit Diagnosis: Status post coronary artery stent placement  Patient's Home Medications on Admission:  Current Outpatient Medications:    acetaminophen  (TYLENOL ) 325 MG tablet, Take 500 mg by mouth every 6 (six) hours as needed., Disp: , Rfl:    anastrozole  (ARIMIDEX ) 1 MG tablet, Take 1 tablet (1 mg total) by mouth daily., Disp: 45 tablet, Rfl: 3   aspirin  81 MG tablet, Take 81 mg by mouth daily., Disp: , Rfl:    calcium carbonate (OS-CAL) 1250 (500 Ca) MG chewable tablet, Chew 1 tablet by mouth daily., Disp: , Rfl:    Calcium Carbonate-Vit D-Min (CALCIUM 1200 PO), Take 1,200 mg by mouth 2 (two) times daily., Disp: , Rfl:    Cholecalciferol (VITAMIN D -3) 25 MCG (1000 UT) CAPS, Take 3,000 Units by mouth daily., Disp: , Rfl:    clopidogrel  (PLAVIX ) 75 MG tablet, Take 1 tablet (75 mg total) by mouth daily with breakfast., Disp: 90 tablet, Rfl: 2   cyanocobalamin  (VITAMIN B12) 1000 MCG tablet, Take 1,000 mcg by mouth 2 (two) times daily., Disp: , Rfl:    ezetimibe  (ZETIA ) 10 MG tablet, Take 10 mg by mouth daily., Disp: , Rfl:    insulin  aspart (NOVOLOG ) 100 UNIT/ML injection, Inject 5-15 Units into the skin 3 (three) times daily with meals. 5-15 units on sliding scale , Disp: , Rfl:    insulin  glargine (LANTUS ) 100 UNIT/ML injection, Inject 45 Units into the skin at bedtime., Disp: , Rfl:    lisinopril -hydrochlorothiazide  (ZESTORETIC ) 10-12.5 MG tablet, Take 1 tablet by mouth daily., Disp: , Rfl:    metoprolol  succinate (TOPROL  XL) 25 MG 24 hr tablet, Take 1 tablet  (25 mg total) by mouth daily., Disp: 30 tablet, Rfl: 2   Multiple Vitamins-Minerals (MULTIVITAMIN ADULT PO), Take 1 tablet by mouth daily., Disp: , Rfl:    nitroGLYCERIN  (NITROSTAT ) 0.4 MG SL tablet, Place 1 tablet (0.4 mg total) under the tongue every 5 (five) minutes as needed., Disp: 25 tablet, Rfl: 2   Semaglutide , 1 MG/DOSE, (OZEMPIC , 1 MG/DOSE,) 4 MG/3ML SOPN, Inject 1 mg into the skin once a week., Disp: , Rfl:    venlafaxine  XR (EFFEXOR -XR) 150 MG 24 hr capsule, Take 150 mg by mouth daily., Disp: , Rfl:   Past Medical History: Past Medical History:  Diagnosis Date   Breast mass 12/27/2018   right- cancer, s/p lumpectomy, radiation   Coccidioidomycosis, pulmonary    Complication of anesthesia    DDD (degenerative disc disease), cervical    Dementia (HCC)    Early per Dr. Vonzell   Depression 2009   DM (diabetes mellitus screen) 2009   Family history of bone cancer    Family history of brain cancer    Family history of breast cancer    Family history of esophageal cancer    Family history of lung cancer    Glaucoma    HTN (hypertension)    Morbid obesity (HCC)    Nodule of upper lobe of left lung 12/27/2018   Personal history of radiation therapy    PONV (postoperative nausea and vomiting)    Sleep apnea  had surgery   Type II diabetes mellitus, uncontrolled 01/31/2019    Tobacco Use: Social History   Tobacco Use  Smoking Status Never  Smokeless Tobacco Never    Labs: Review Flowsheet       Latest Ref Rng & Units 01/29/2010 01/30/2010  Labs for ITP Cardiac and Pulmonary Rehab  Cholestrol 0 - 200 mg/dL - 815        ATP III CLASSIFICATION:  <200     mg/dL   Desirable  799-760  mg/dL   Borderline High  >=759    mg/dL   High          LDL (calc) 0 - 99 mg/dL - 78        Total Cholesterol/HDL:CHD Risk Coronary Heart Disease Risk Table                     Men   Women  1/2 Average Risk   3.4   3.3  Average Risk       5.0   4.4  2 X Average Risk   9.6    7.1  3 X Average Risk  23.4   11.0        Use the calculated Patient Ratio above and the CHD Risk Table to determine the patient's CHD Risk.        ATP III CLASSIFICATION (LDL):  <100     mg/dL   Optimal  899-870  mg/dL   Near or Above                    Optimal  130-159  mg/dL   Borderline  839-810  mg/dL   High  >809     mg/dL   Very High   HDL-C >60 mg/dL - 44   Trlycerides <849 mg/dL - 691   Hemoglobin J8r <5.7 % 11.7 (NOTE)                                                                       According to the ADA Clinical Practice Recommendations for 2011, when HbA1c is used as a screening test:   >=6.5%   Diagnostic of Diabetes Mellitus           (if abnormal result  is confirmed)  5.7-6.4%   Increased risk of developing Diabetes Mellitus  References:Diagnosis and Classification of Diabetes Mellitus,Diabetes Care,2011,34(Suppl 1):S62-S69 and Standards of Medical Care in         Diabetes - 2011,Diabetes Care,2011,34  (Suppl 1):S11-S61.  -     Exercise Target Goals: Exercise Program Goal: Individual exercise prescription set using results from initial 6 min walk test and THRR while considering  patient's activity barriers and safety.   Exercise Prescription Goal: Initial exercise prescription builds to 30-45 minutes a day of aerobic activity, 2-3 days per week.  Home exercise guidelines will be given to patient during program as part of exercise prescription that the participant will acknowledge.   Education: Aerobic Exercise: - Group verbal and visual presentation on the components of exercise prescription. Introduces F.I.T.T principle from ACSM for exercise prescriptions.  Reviews F.I.T.T. principles of aerobic exercise including progression. Written material provided at class time.   Education: Resistance Exercise: - Group verbal and  visual presentation on the components of exercise prescription. Introduces F.I.T.T principle from ACSM for exercise prescriptions  Reviews  F.I.T.T. principles of resistance exercise including progression. Written material provided at class time.    Education: Exercise & Equipment Safety: - Individual verbal instruction and demonstration of equipment use and safety with use of the equipment.   Education: Exercise Physiology & General Exercise Guidelines: - Group verbal and written instruction with models to review the exercise physiology of the cardiovascular system and associated critical values. Provides general exercise guidelines with specific guidelines to those with heart or lung disease. Written material provided at class time.   Education: Flexibility, Balance, Mind/Body Relaxation: - Group verbal and visual presentation with interactive activity on the components of exercise prescription. Introduces F.I.T.T principle from ACSM for exercise prescriptions. Reviews F.I.T.T. principles of flexibility and balance exercise training including progression. Also discusses the mind body connection.  Reviews various relaxation techniques to help reduce and manage stress (i.e. Deep breathing, progressive muscle relaxation, and visualization). Balance handout provided to take home. Written material provided at class time.   Activity Barriers & Risk Stratification:  Activity Barriers & Cardiac Risk Stratification - 11/03/23 0837       Activity Barriers & Cardiac Risk Stratification   Activity Barriers Deconditioning;Muscular Weakness;Shortness of Breath;History of Falls;Balance Concerns;Chest Pain/Angina    Cardiac Risk Stratification High          6 Minute Walk:  6 Minute Walk     Row Name 11/03/23 1005         6 Minute Walk   Phase Initial     Distance 910 feet     Walk Time 6 minutes     # of Rest Breaks 0     MPH 1.72     METS 2.13     RPE 11     VO2 Peak 7.44     Symptoms Yes (comment)     Comments a little dizzy from turns     Resting HR 73 bpm     Resting BP 126/70     Resting Oxygen Saturation  96 %      Exercise Oxygen Saturation  during 6 min walk 96 %     Max Ex. HR 113 bpm     Max Ex. BP 194/84     2 Minute Post BP 186/84        Oxygen Initial Assessment:   Oxygen Re-Evaluation:   Oxygen Discharge (Final Oxygen Re-Evaluation):   Initial Exercise Prescription:  Initial Exercise Prescription - 11/03/23 1000       Date of Initial Exercise RX and Referring Provider   Date 11/03/23    Referring Provider Verlin Bruckner MD      Oxygen   Maintain Oxygen Saturation 88% or higher      Treadmill   MPH 1.2    Grade 0.5    Minutes 15    METs 2      NuStep   Level 1    SPM 80    Minutes 15    METs 2      Prescription Details   Frequency (times per week) 3    Duration Progress to 30 minutes of continuous aerobic without signs/symptoms of physical distress      Intensity   THRR 40-80% of Max Heartrate 103-134    Ratings of Perceived Exertion 11-13    Perceived Dyspnea 0-4      Progression   Progression Continue to progress workloads to maintain intensity  without signs/symptoms of physical distress.      Resistance Training   Training Prescription Yes    Weight 3 lb    Reps 10-15          Perform Capillary Blood Glucose checks as needed.  Exercise Prescription Changes:   Exercise Prescription Changes     Row Name 11/03/23 1000             Response to Exercise   Blood Pressure (Admit) 126/70       Blood Pressure (Exercise) 194/84       Blood Pressure (Exit) 162/74       Heart Rate (Admit) 73 bpm       Heart Rate (Exercise) 113 bpm       Heart Rate (Exit) 79 bpm       Oxygen Saturation (Admit) 96 %       Oxygen Saturation (Exercise) 96 %       Rating of Perceived Exertion (Exercise) 11       Symptoms a little dizzy from turns       Comments walk test results          Exercise Comments:   Exercise Comments     Row Name 11/04/23 1256           Exercise Comments First full day of exercise!  Patient was oriented to gym and equipment  including functions, settings, policies, and procedures.  Patient's individual exercise prescription and treatment plan were reviewed.  All starting workloads were established based on the results of the 6 minute walk test done at initial orientation visit.  The plan for exercise progression was also introduced and progression will be customized based on patient's performance and goals.          Exercise Goals and Review:   Exercise Goals     Row Name 11/03/23 1007             Exercise Goals   Increase Physical Activity Yes       Intervention Provide advice, education, support and counseling about physical activity/exercise needs.;Develop an individualized exercise prescription for aerobic and resistive training based on initial evaluation findings, risk stratification, comorbidities and participant's personal goals.       Expected Outcomes Short Term: Attend rehab on a regular basis to increase amount of physical activity.;Long Term: Exercising regularly at least 3-5 days a week.;Long Term: Add in home exercise to make exercise part of routine and to increase amount of physical activity.       Increase Strength and Stamina Yes       Intervention Provide advice, education, support and counseling about physical activity/exercise needs.;Develop an individualized exercise prescription for aerobic and resistive training based on initial evaluation findings, risk stratification, comorbidities and participant's personal goals.       Expected Outcomes Short Term: Increase workloads from initial exercise prescription for resistance, speed, and METs.;Short Term: Perform resistance training exercises routinely during rehab and add in resistance training at home;Long Term: Improve cardiorespiratory fitness, muscular endurance and strength as measured by increased METs and functional capacity ( )       Able to understand and use rate of perceived exertion (RPE) scale Yes       Intervention Provide  education and explanation on how to use RPE scale       Expected Outcomes Short Term: Able to use RPE daily in rehab to express subjective intensity level;Long Term:  Able to use RPE to guide intensity level when  exercising independently       Able to understand and use Dyspnea scale Yes       Intervention Provide education and explanation on how to use Dyspnea scale       Expected Outcomes Short Term: Able to use Dyspnea scale daily in rehab to express subjective sense of shortness of breath during exertion;Long Term: Able to use Dyspnea scale to guide intensity level when exercising independently       Knowledge and understanding of Target Heart Rate Range (THRR) Yes       Intervention Provide education and explanation of THRR including how the numbers were predicted and where they are located for reference       Expected Outcomes Short Term: Able to state/look up THRR;Long Term: Able to use THRR to govern intensity when exercising independently;Short Term: Able to use daily as guideline for intensity in rehab       Able to check pulse independently Yes       Intervention Provide education and demonstration on how to check pulse in carotid and radial arteries.;Review the importance of being able to check your own pulse for safety during independent exercise       Expected Outcomes Short Term: Able to explain why pulse checking is important during independent exercise;Long Term: Able to check pulse independently and accurately       Understanding of Exercise Prescription Yes       Intervention Provide education, explanation, and written materials on patient's individual exercise prescription       Expected Outcomes Short Term: Able to explain program exercise prescription;Long Term: Able to explain home exercise prescription to exercise independently          Exercise Goals Re-Evaluation :  Exercise Goals Re-Evaluation     Row Name 11/04/23 1257             Exercise Goal Re-Evaluation    Exercise Goals Review Understanding of Exercise Prescription;Able to understand and use Dyspnea scale;Able to understand and use rate of perceived exertion (RPE) scale;Knowledge and understanding of Target Heart Rate Range (THRR)       Comments Reviewed RPE and dyspnea scale, THR and program prescription with pt today.  Pt voiced understanding and was given a copy of goals to take home.       Expected Outcomes Short: Use RPE daily to regulate intensity.  Long: Follow program prescription in THR.          Discharge Exercise Prescription (Final Exercise Prescription Changes):  Exercise Prescription Changes - 11/03/23 1000       Response to Exercise   Blood Pressure (Admit) 126/70    Blood Pressure (Exercise) 194/84    Blood Pressure (Exit) 162/74    Heart Rate (Admit) 73 bpm    Heart Rate (Exercise) 113 bpm    Heart Rate (Exit) 79 bpm    Oxygen Saturation (Admit) 96 %    Oxygen Saturation (Exercise) 96 %    Rating of Perceived Exertion (Exercise) 11    Symptoms a little dizzy from turns    Comments walk test results          Nutrition:  Target Goals: Understanding of nutrition guidelines, daily intake of sodium 1500mg , cholesterol 200mg , calories 30% from fat and 7% or less from saturated fats, daily to have 5 or more servings of fruits and vegetables.  Education: Nutrition 1 -Group instruction provided by verbal, written material, interactive activities, discussions, models, and posters to present general guidelines for heart  healthy nutrition including macronutrients, label reading, and promoting whole foods over processed counterparts. Education serves as pensions consultant of discussion of heart healthy eating for all. Written material provided at class time.    Education: Nutrition 2 -Group instruction provided by verbal, written material, interactive activities, discussions, models, and posters to present general guidelines for heart healthy nutrition including sodium, cholesterol,  and saturated fat. Providing guidance of habit forming to improve blood pressure, cholesterol, and body weight. Written material provided at class time.     Biometrics:  Pre Biometrics - 11/03/23 1008       Pre Biometrics   Height 5' (1.524 m)    Weight 201 lb 14.4 oz (91.6 kg)    Waist Circumference 38 inches    Hip Circumference 41 inches    Waist to Hip Ratio 0.93 %    BMI (Calculated) 39.43    Grip Strength 21.1 kg    Single Leg Stand 30 seconds           Nutrition Therapy Plan and Nutrition Goals:  Nutrition Therapy & Goals - 11/03/23 0846       Intervention Plan   Intervention Prescribe, educate and counsel regarding individualized specific dietary modifications aiming towards targeted core components such as weight, hypertension, lipid management, diabetes, heart failure and other comorbidities.;Nutrition handout(s) given to patient.    Expected Outcomes Short Term Goal: Understand basic principles of dietary content, such as calories, fat, sodium, cholesterol and nutrients.;Long Term Goal: Adherence to prescribed nutrition plan.          Nutrition Assessments:  MEDIFICTS Score Key: >=70 Need to make dietary changes  40-70 Heart Healthy Diet <= 40 Therapeutic Level Cholesterol Diet  Flowsheet Row CARDIAC REHAB PHASE II ORIENTATION from 11/03/2023 in [facility]  Picture Your Plate Total Score on Admission 68   Picture Your Plate Scores: <59 Unhealthy dietary pattern with much room for improvement. 41-50 Dietary pattern unlikely to meet recommendations for good health and room for improvement. 51-60 More healthful dietary pattern, with some room for improvement.  >60 Healthy dietary pattern, although there may be some specific behaviors that could be improved.    Nutrition Goals Re-Evaluation:   Nutrition Goals Discharge (Final Nutrition Goals Re-Evaluation):   Psychosocial: Target Goals: Acknowledge presence or absence of  significant depression and/or stress, maximize coping skills, provide positive support system. Participant is able to verbalize types and ability to use techniques and skills needed for reducing stress and depression.   Education: Stress, Anxiety, and Depression - Group verbal and visual presentation to define topics covered.  Reviews how body is impacted by stress, anxiety, and depression.  Also discusses healthy ways to reduce stress and to treat/manage anxiety and depression. Written material provided at class time.   Education: Sleep Hygiene -Provides group verbal and written instruction about how sleep can affect your health.  Define sleep hygiene, discuss sleep cycles and impact of sleep habits. Review good sleep hygiene tips.   Initial Review & Psychosocial Screening:  Initial Psych Review & Screening - 11/03/23 0842       Initial Review   Current issues with Current Depression;Current Psychotropic Meds;Current Sleep Concerns;Current Stress Concerns    Source of Stress Concerns Chronic Illness;Unable to participate in former interests or hobbies;Unable to perform yard/household activities    Comments sleep good some days and not other days, lives alone, generic effexor  has crazy dreams when she misses a dose and plans to talk to doctor about it  Family Dynamics   Good Support System? Yes   oldest daughter lives in town and one daughter in [place]     Barriers   Psychosocial barriers to participate in program Psychosocial barriers identified (see note);The patient should benefit from training in stress management and relaxation.      Screening Interventions   Interventions Encouraged to exercise;To provide support and resources with identified psychosocial needs;Provide feedback about the scores to participant    Expected Outcomes Short Term goal: Utilizing psychosocial counselor, staff and physician to assist with identification of specific Stressors or current issues  interfering with healing process. Setting desired goal for each stressor or current issue identified.;Long Term Goal: Stressors or current issues are controlled or eliminated.;Long Term goal: The participant improves quality of Life and PHQ9 Scores as seen by post scores and/or verbalization of changes;Short Term goal: Identification and review with participant of any Quality of Life or Depression concerns found by scoring the questionnaire.          Quality of Life Scores:   Quality of Life - 11/04/23 1256       Quality of Life   Select Quality of Life      Quality of Life Scores   Health/Function Pre 14.57 %    Socioeconomic Pre 21 %    Psych/Spiritual Pre 23.79 %    Family Pre 15.75 %    GLOBAL Pre 18.03 %         Scores of 19 and below usually indicate a poorer quality of life in these areas.  A difference of  2-3 points is a clinically meaningful difference.  A difference of 2-3 points in the total score of the Quality of Life Index has been associated with significant improvement in overall quality of life, self-image, physical symptoms, and general health in studies assessing change in quality of life.  PHQ-9: Review Flowsheet       11/03/2023  Depression screen PHQ 2/9  Decreased Interest 2  Down, Depressed, Hopeless 3  PHQ - 2 Score 5  Altered sleeping 3  Tired, decreased energy 3  Change in appetite 2  Feeling bad or failure about yourself  0  Trouble concentrating 0  Moving slowly or fidgety/restless 0  Suicidal thoughts 0  PHQ-9 Score 13  Difficult doing work/chores Very difficult   Interpretation of Total Score  Total Score Depression Severity:  1-4 = Minimal depression, 5-9 = Mild depression, 10-14 = Moderate depression, 15-19 = Moderately severe depression, 20-27 = Severe depression   Psychosocial Evaluation and Intervention:  Psychosocial Evaluation - 11/03/23 1008       Psychosocial Evaluation & Interventions   Interventions Stress management  education;Relaxation education;Encouraged to exercise with the program and follow exercise prescription    Comments Miranda Fuller is coming into cardiac rehab after a stent.  Prior to her stent she had had a syncopal episode and they believe the two may be linked.  She has also been experiencing high blood pressure reading recently and plans to discuss with her PCP tomorrow.  This will be her last appt with current PCP as he is retiring and she will be sad to see him go. They are also going to look at adding in renexa with her ongoing intermitten chest pain.  She has a history of bilateral breast cancer and a lung nodule.  She has not been very active recently and looking forward to getting up and moving again. She was to feel better and build up her energy.  She is also hoping that it will help her sleep better.  She does also note a history of depression and takes generic effexor  but finds that if she misses a dose she will have very weird and vivid dreams.  She plans to discuss this with her doctor as well.  She does not preceive any barriers to attending rehab at this time.  She lives alone but has two daughters; one lives in [place] and one in [place] so they are nearby.  Her biggest concern is making sure her sugar stays where it needs to be and she will check it to make sure she is good to go.    Expected Outcomes Short; Talk to doctor about meds Long: Attend rehab to build up stamina    Continue Psychosocial Services  Follow up required by staff          Psychosocial Re-Evaluation:   Psychosocial Discharge (Final Psychosocial Re-Evaluation):   Vocational Rehabilitation: Provide vocational rehab assistance to qualifying candidates.   Vocational Rehab Evaluation & Intervention:  Vocational Rehab - 11/03/23 0842       Initial Vocational Rehab Evaluation & Intervention   Assessment shows need for Vocational Rehabilitation No   retired         Education: Education Goals: Education  classes will be provided on a variety of topics geared toward better understanding of heart health and risk factor modification. Participant will state understanding/return demonstration of topics presented as noted by education test scores.  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   Test and Procedures: - Group verbal and visual presentation and models provide information about basic cardiac anatomy and function. Reviews the testing methods done to diagnose heart disease and the outcomes of the test results. Describes the treatment choices: Medical Management, Angioplasty, or Coronary Bypass Surgery for treating various heart conditions including Myocardial Infarction, Angina, Valve Disease, and Cardiac Arrhythmias. Written material provided at class time. Flowsheet Row CARDIAC REHAB PHASE II EXERCISE from 11/04/2023 in [place] [facility]  Date 11/04/23  Educator DJ  Instruction Review Code 1- Verbalizes Understanding    Medication Safety: - Group verbal and visual instruction to review commonly prescribed medications for heart and lung disease. Reviews the medication, class of the drug, and side effects. Includes the steps to properly store meds and maintain the prescription regimen. Written material provided at class time.   Intimacy: - Group verbal instruction through game format to discuss how heart and lung disease can affect sexual intimacy. Written material provided at class time.   Know Your Numbers and Heart Failure: - Group  verbal and visual instruction to discuss disease risk factors for cardiac and pulmonary disease and treatment options.  Reviews associated critical values for Overweight/Obesity, Hypertension, Cholesterol, and Diabetes.  Discusses basics of heart failure: signs/symptoms and treatments.  Introduces Heart Failure Zone chart for action plan for heart failure. Written material provided at class time.   Infection Prevention: - Provides verbal and written material to individual with discussion of infection control including proper hand washing and proper equipment cleaning  during exercise session.   Falls Prevention: - Provides verbal and written material to individual with discussion of falls prevention and safety.   Other: -Provides group and verbal instruction on various topics (see comments)   Knowledge Questionnaire Score:  Knowledge Questionnaire Score - 11/04/23 1256       Knowledge Questionnaire Score   Pre Score 25/26          Core Components/Risk Factors/Patient Goals at Admission:  Personal Goals and Risk Factors at Admission - 11/03/23 1015       Core Components/Risk Factors/Patient Goals on Admission    Weight Management Yes;Obesity;Weight Loss    Intervention Weight Management: Develop a combined nutrition and exercise program designed to reach desired caloric intake, while maintaining appropriate intake of nutrient and fiber, sodium and fats, and appropriate energy expenditure required for the weight goal.;Weight Management: Provide education and appropriate resources to help participant work on and attain dietary goals.;Weight Management/Obesity: Establish reasonable short term and long term weight goals.;Obesity: Provide education and appropriate resources to help participant work on and attain dietary goals.    Admit Weight 201 lb 14.4 oz (91.6 kg)    Goal Weight: Short Term 195 lb (88.5 kg)    Goal Weight: Long Term 190 lb (86.2 kg)    Expected Outcomes Short Term:  Continue to assess and modify interventions until short term weight is achieved;Long Term: Adherence to nutrition and physical activity/exercise program aimed toward attainment of established weight goal;Weight Loss: Understanding of general recommendations for a balanced deficit meal plan, which promotes 1-2 lb weight loss per week and includes a negative energy balance of [phone] kcal/d;Understanding recommendations for meals to include 15-35% energy as protein, 25-35% energy from fat, 35-60% energy from carbohydrates, less than 200mg  of dietary cholesterol, 20-35 gm of total fiber daily;Understanding of distribution of calorie intake throughout the day with the consumption of 4-5 meals/snacks    Improve shortness of breath with ADL's Yes    Intervention Provide education, individualized exercise plan and daily activity instruction to help decrease symptoms of SOB with activities of daily living.    Expected Outcomes Short Term: Improve cardiorespiratory fitness to achieve a reduction of symptoms when performing ADLs;Long Term: Be able to perform more ADLs without symptoms or delay the onset of symptoms    Diabetes Yes    Intervention Provide education about signs/symptoms and action to take for hypo/hyperglycemia.;Provide education about proper nutrition, including hydration, and aerobic/resistive exercise prescription along with prescribed medications to achieve blood glucose in normal ranges: Fasting glucose 65-99 mg/dL    Expected Outcomes Short Term: Participant verbalizes understanding of the signs/symptoms and immediate care of hyper/hypoglycemia, proper foot care and importance of medication, aerobic/resistive exercise and nutrition plan for blood glucose control.;Long Term: Attainment of HbA1C < 7%.    Hypertension Yes    Intervention Provide education on lifestyle modifcations including regular physical activity/exercise, weight management, moderate sodium restriction and increased consumption  of fresh fruit, vegetables, and low fat dairy, alcohol moderation, and smoking cessation.;Monitor prescription use compliance.    Expected Outcomes Short Term: Continued assessment and intervention until BP is < 140/91mm HG in hypertensive participants. < 130/63mm HG in hypertensive participants with diabetes, heart failure or chronic kidney disease.;Long Term: Maintenance of blood pressure at goal levels.    Lipids Yes    Intervention Provide education and support for participant on nutrition & aerobic/resistive exercise along with prescribed medications to achieve LDL 70mg , HDL >40mg .    Expected Outcomes Long Term: Cholesterol controlled with  medications as prescribed, with individualized exercise RX and with personalized nutrition plan. Value goals: LDL < 70mg , HDL > 40 mg.;Short Term: Participant states understanding of desired cholesterol values and is compliant with medications prescribed. Participant is following exercise prescription and nutrition guidelines.          Education:Diabetes - Individual verbal and written instruction to review signs/symptoms of diabetes, desired ranges of glucose level fasting, after meals and with exercise. Acknowledge that pre and post exercise glucose checks will be done for 3 sessions at entry of program.   Core Components/Risk Factors/Patient Goals Review:    Core Components/Risk Factors/Patient Goals at Discharge (Final Review):    ITP Comments:  ITP Comments     Row Name 11/03/23 0956 11/04/23 0949 11/25/23 1424 12/16/23 0924 12/23/23 0945   ITP Comments Patient attend orientation today.  Patient is attending Cardiac Rehabilitation Program.  Documentation for diagnosis can be found in [facility] 08/19/23.  Reviewed medical chart, RPE/RPD, gym safety, and program guidelines.  Patient was fitted to equipment they will be using during rehab.  Patient is scheduled to start exercise on Wednesday 11/04/23 at 915.   Initial ITP created and sent for review and  signature by Dr. Dorn Ross, Medical Director for Cardiac Rehabilitation Program. First full day of exercise!  Patient was oriented to gym and equipment including functions, settings, policies, and procedures.  Patient's individual exercise prescription and treatment plan were reviewed.  All starting workloads were established based on the results of the 6 minute walk test done at initial orientation visit.  The plan for exercise progression was also introduced and progression will be customized based on patient's performance and goals. 30 day review completed. ITP sent to Dr. Dorn Ross, Medical Director of Cardiac Rehab. Continue with ITP unless changes are made by physician.  Still newer to program and has been out sick recently. Miranda Fuller called to let us  know that she is still intolerant to her new meds.  She is very dizzy and lightheaded and feels unable to drive.  She was encouraged to call the cardiology office again to let them know she is still feeling this way. 30 day review completed. ITP sent to Dr. Dorn Ross, Medical Director of Cardiac Rehab. Continue with ITP unless changes are made by physician.  Pt has been out sick, no goals obtained.    Row Name 12/28/23 9063 12/28/23 0954 01/20/24 0803       ITP Comments Called patient regarding attendance. She continues to be dizzy and fell last week on her porch. She now has hip and shoulder pain and is using a walker. She has not contacted her cardiology regarding the dizziness because she says she had dizziness before she started the new medication. She does plan to return but does not know when. Called patient regarding attendance. She continues to be dizzy and fell last week on her porch. She now has hip and shoulder pain and is using a walker. She has not contacted her cardiology regarding the dizziness because she says she had dizziness before she started the new medication. She does plan to return but does not know when. 30 day review  completed. ITP sent to Dr. Dorn Ross, Medical Director of Cardiac Rehab. Continue with ITP unless changes are made by physician. Patient has no goals due to attendance and being out sick.        Comments: 30 day review

## 2024-01-21 ENCOUNTER — Ambulatory Visit: Admitting: Nurse Practitioner

## 2024-01-21 NOTE — Progress Notes (Deleted)
 Office Visit    Patient Name: Miranda Fuller Date of Encounter: 01/21/2024  Primary Care Provider:  Atilano Deward ORN, MD Primary Cardiologist:  Lonni Cash, MD  Chief Complaint    73 year old female with a history of CAD s/p DES-OM1 in 08/2023, syncope, PSVT, NSVT, hypertension, hyperlipidemia, type 2 diabetes, right breast cancer s/p lumpectomy and radiation, glaucoma, mild dementia, OSA and obesity who presents for follow-up related to CAD.  Past Medical History    Past Medical History:  Diagnosis Date   Breast mass 12/27/2018   right- cancer, s/p lumpectomy, radiation   Coccidioidomycosis, pulmonary    Complication of anesthesia    DDD (degenerative disc disease), cervical    Dementia (HCC)    Early per Dr. Vonzell   Depression 2009   DM (diabetes mellitus screen) 2009   Family history of bone cancer    Family history of brain cancer    Family history of breast cancer    Family history of esophageal cancer    Family history of lung cancer    Glaucoma    HTN (hypertension)    Morbid obesity (HCC)    Nodule of upper lobe of left lung 12/27/2018   Personal history of radiation therapy    PONV (postoperative nausea and vomiting)    Sleep apnea    had surgery   Type II diabetes mellitus, uncontrolled 01/31/2019   Past Surgical History:  Procedure Laterality Date   ABDOMINAL HYSTERECTOMY     TAH BSO   BREAST BIOPSY Right 02/07/2019   BREAST LUMPECTOMY Right 04/28/2019   BREAST LUMPECTOMY WITH RADIOACTIVE SEED AND SENTINEL LYMPH NODE BIOPSY Left 03/22/2021   Procedure: LEFT BREAST LUMPECTOMY WITH RADIOACTIVE SEED AND SENTINEL LYMPH NODE BIOPSY;  Surgeon: Curvin Deward MOULD, MD;  Location: [facility];  Service: General;  Laterality: Left;   BREAST LUMPECTOMY WITH RADIOACTIVE SEED LOCALIZATION Right 04/28/2019   Procedure: RIGHT BREAST LUMPECTOMY WITH RADIOACTIVE SEED LOCALIZATION;  Surgeon: Curvin Deward MOULD, MD;  Location: [facility];   Service: General;  Laterality: Right;   CHOLECYSTECTOMY     CORONARY ANGIOGRAPHY N/A 08/19/2023   Procedure: CORONARY ANGIOGRAPHY;  Surgeon: Cash Lonni BIRCH, MD;  Location: [facility];  Service: Cardiovascular;  Laterality: N/A;   CORONARY STENT INTERVENTION N/A 08/19/2023   Procedure: CORONARY STENT INTERVENTION;  Surgeon: Cash Lonni BIRCH, MD;  Location: [facility];  Service: Cardiovascular;  Laterality: N/A;   LAPAROSCOPIC GASTRIC SLEEVE RESECTION     TUBAL LIGATION     UVULOPALATOPHARYNGOPLASTY      Allergies  Allergies  Allergen Reactions   Oxycodone-Acetaminophen  Other (See Comments), Itching, Rash and Hives    Other reaction(s): Hallucinations   Propoxyphene Other (See Comments), Hives, Itching and Rash    Other reaction(s): Delusions (intolerance)   Demerol [Meperidine] Other (See Comments)    hallucinations   Morphine And Codeine Hives   Oxycodone-Aspirin  Other (See Comments) and Hives   Chlorhexidine  Rash     Labs/Other Studies Reviewed    The following studies were reviewed today: *** Cardiac Studies & Procedures   ______________________________________________________________________________________________ CARDIAC CATHETERIZATION  CARDIAC CATHETERIZATION 08/19/2023  Conclusion   1st Mrg lesion is 99% stenosed.   Mid LAD lesion is 40% stenosed.   A drug-eluting stent was successfully placed using a STENT ONYX FRONTIER 2.25X15.   Post intervention, there is a 0% residual stenosis.  Severe stenosis first obtuse marginal branch Successful PTCA/DES x 1 obtuse marginal branch. Non-obstructive moderate disease in  the mid LAD Small non-dominant RCA  Recommendations: DAPT with ASA and Plavix  for at least six months. Monitor overnight given complicated groin access as described above.  Findings Coronary Findings Diagnostic  Dominance: Left  Left Anterior Descending Vessel is large. Mid LAD lesion is 40% stenosed.  Left  Circumflex  First Obtuse Marginal Branch Vessel is moderate in size. 1st Mrg lesion is 99% stenosed.  Intervention  1st Mrg lesion Stent CATH VISTA GUIDE 6FR XB3 MULPK guide catheter was inserted. Lesion crossed with guidewire using a WIRE HI TORQ WHISPER MS 190CM. A drug-eluting stent was successfully placed using a STENT ONYX FRONTIER 2.25X15. Stent strut is well apposed. Post-stent angioplasty was not performed. Post-Intervention Lesion Assessment The intervention was unsuccessful. Pre-interventional TIMI flow is 3. Post-intervention TIMI flow is 3. No complications occurred at this lesion. There is a 0% residual stenosis post intervention.   STRESS TESTS  EXERCISE TOLERANCE TEST (ETT) 12/29/2014  Interpretation Summary  Blood pressure demonstrated a hypotensive response to exercise.  There was no ST segment deviation noted during stress.  ETT with fair exercise tolerance (6:00); no chest pain; hypotensive BP response (SBP documented at 194 stage 1 and 165 stage 2); no ST changes; abnormal ETT due to BP response.   ECHOCARDIOGRAM  ECHOCARDIOGRAM COMPLETE 12/02/2023  Narrative ECHOCARDIOGRAM REPORT    Patient Name:   Miranda Fuller Date of Exam: 12/02/2023 Medical Rec #:  [id number]     Height:       60.0 in Accession #:    [id number]    Weight:       201.9 lb Date of Birth:  1950-12-17     BSA:          1.874 m Patient Age:    73 years      BP:           130/68 mmHg Patient Gender: F             HR:           82 bpm. Exam Location:  [address]  Procedure: 2D Echo and Intracardiac Opacification Agent (Both Spectral and Color Flow Doppler were utilized during procedure).  Indications:    I25.10 Coronary artery disease - Native Vessel  History:        Patient has prior history of Echocardiogram examinations, most recent 02/23/2018. Signs/Symptoms:Chest Pain; Risk Factors:Diabetes, Hypertension and Sleep Apnea. Breast cancer. Obesity.  Sonographer:    Jon Hacker RCS Referring Phys: [id number] CHRISTOPHER D MCALHANY  IMPRESSIONS   1. Left ventricular ejection fraction, by estimation, is 65 to 70%. Left ventricular ejection fraction by PLAX is 68 %. The left ventricle has normal function. The left ventricle has no regional wall motion abnormalities. There is mild concentric left ventricular hypertrophy. Left ventricular diastolic parameters are consistent with Grade I diastolic dysfunction (impaired relaxation). Elevated left ventricular end-diastolic pressure. The E/e' is 19. 2. Right ventricular systolic function is low normal. The right ventricular size is normal. Tricuspid regurgitation signal is inadequate for assessing PA pressure. The estimated right ventricular systolic pressure is 17.2 mmHg. 3. The mitral valve is degenerative. Trivial mitral valve regurgitation. Mild mitral stenosis. The mean mitral valve gradient is 7.0 mmHg with average heart rate of 73 bpm. Moderate mitral annular calcification. 4. The aortic valve is tricuspid. Aortic valve regurgitation is not visualized. Moderate aortic valve stenosis. Aortic valve area, by VTI measures 1.05 cm. Aortic valve mean gradient measures 15.0 mmHg. Aortic valve Vmax measures 2.55 m/s. Peak gradient  26 mmHg, DI 0.34. 5. The inferior vena cava is normal in size with greater than 50% respiratory variability, suggesting right atrial pressure of 3 mmHg.  Comparison(s): Prior images unable to be directly viewed, comparison made by report only. Changes from prior study are noted. 02/23/2018: LVEF 60-65%, no AS.  FINDINGS Left Ventricle: Left ventricular ejection fraction, by estimation, is 65 to 70%. Left ventricular ejection fraction by PLAX is 68 %. The left ventricle has normal function. The left ventricle has no regional wall motion abnormalities. The left ventricular internal cavity size was normal in size. There is mild concentric left ventricular hypertrophy. Left ventricular diastolic parameters  are consistent with Grade I diastolic dysfunction (impaired relaxation). Elevated left ventricular end-diastolic pressure. The E/e' is 58.  Right Ventricle: The right ventricular size is normal. No increase in right ventricular wall thickness. Right ventricular systolic function is low normal. Tricuspid regurgitation signal is inadequate for assessing PA pressure. The tricuspid regurgitant velocity is 1.88 m/s, and with an assumed right atrial pressure of 3 mmHg, the estimated right ventricular systolic pressure is 17.2 mmHg.  Left Atrium: Left atrial size was normal in size.  Right Atrium: Right atrial size was not well visualized.  Pericardium: There is no evidence of pericardial effusion.  Mitral Valve: The mitral valve is degenerative in appearance. Moderate mitral annular calcification. Trivial mitral valve regurgitation. Mild mitral valve stenosis. MV peak gradient, 14.7 mmHg. The mean mitral valve gradient is 7.0 mmHg with average heart rate of 73 bpm.  Tricuspid Valve: The tricuspid valve is grossly normal. Tricuspid valve regurgitation is mild.  Aortic Valve: The aortic valve is tricuspid. Aortic valve regurgitation is not visualized. Moderate aortic stenosis is present. Aortic valve mean gradient measures 15.0 mmHg. Aortic valve peak gradient measures 26.0 mmHg. Aortic valve area, by VTI measures 1.05 cm.  Pulmonic Valve: The pulmonic valve was grossly normal. Pulmonic valve regurgitation is trivial.  Aorta: The aortic root and ascending aorta are structurally normal, with no evidence of dilitation.  Venous: The inferior vena cava is normal in size with greater than 50% respiratory variability, suggesting right atrial pressure of 3 mmHg.  IAS/Shunts: No atrial level shunt detected by color flow Doppler.   LEFT VENTRICLE PLAX 2D LV EF:         Left            Diastology ventricular     LV e' medial:    6.42 cm/s ejection        LV E/e' medial:  21.2 fraction by     LV e'  lateral:   7.72 cm/s PLAX is 68      LV E/e' lateral: 17.6 %. LVIDd:         4.01 cm LVIDs:         2.52 cm LV PW:         1.12 cm LV IVS:        1.37 cm LVOT diam:     2.00 cm LV SV:         65 LV SV Index:   35 LVOT Area:     3.14 cm   RIGHT VENTRICLE RV S prime:     9.36 cm/s  LEFT ATRIUM             Index LA diam:        3.90 cm 2.08 cm/m LA Vol (A2C):   51.4 ml 27.43 ml/m LA Vol (A4C):   48.5 ml 25.88 ml/m LA Biplane Vol:  49.5 ml 26.41 ml/m AORTIC VALVE AV Area (Vmax):    1.12 cm AV Area (Vmean):   1.05 cm AV Area (VTI):     1.05 cm AV Vmax:           255.00 cm/s AV Vmean:          186.000 cm/s AV VTI:            0.620 m AV Peak Grad:      26.0 mmHg AV Mean Grad:      15.0 mmHg LVOT Vmax:         90.70 cm/s LVOT Vmean:        62.400 cm/s LVOT VTI:          0.208 m LVOT/AV VTI ratio: 0.34  AORTA Ao Root diam: 3.10 cm Ao Asc diam:  3.30 cm  MITRAL VALVE                TRICUSPID VALVE MV Area (PHT): 3.63 cm     TR Peak grad:   14.2 mmHg MV Area VTI:   1.36 cm     TR Vmax:        188.50 cm/s MV Peak grad:  14.7 mmHg MV Mean grad:  7.0 mmHg     SHUNTS MV Vmax:       1.92 m/s     Systemic VTI:  0.21 m MV Vmean:      127.0 cm/s   Systemic Diam: 2.00 cm MV Decel Time: 209 msec MV E velocity: 136.00 cm/s MV A velocity: 184.00 cm/s MV E/A ratio:  0.74  Vinie Maxcy MD Electronically signed by Vinie Maxcy MD Signature Date/Time: 12/03/2023/11:32:10 AM    Final    MONITORS  LONG TERM MONITOR (3-14 DAYS) 07/16/2023  Narrative Patch Wear Time:  13 days and 23 hours (2025-04-07T16:12:13-0400 to 2025-04-21T16:12:07-398)  Sinus rhythm. (min HR of 64 bpm, max HR of 174 bpm, and avg HR of 83 bpm). 2 Ventricular Tachycardia runs occurred, the longest was 8 beats with a max rate of 174 bpm. 16 Supraventricular Tachycardia runs occurred, the longest was 21.4 seconds. Isolated SVEs were rare (<1.0%), SVE Couplets were rare (<1.0%), and SVE Triplets were  rare (<1.0%). Isolated VEs were rare (<1.0%), VE Couplets were rare (<1.0%), and no VE Triplets were present.   CT SCANS  CT CORONARY MORPH W/CTA COR W/SCORE 07/14/2023  Addendum 07/28/2023  5:50 PM ADDENDUM REPORT: 07/28/2023 17:48  EXAM: OVER-READ INTERPRETATION  CT CHEST  The following report is an over-read performed by radiologist Dr. Andrea Gasman of [facility] on 07/28/2023. This over-read does not include interpretation of cardiac or coronary anatomy or pathology. The coronary CTA interpretation by the cardiologist is attached.  COMPARISON:  Cardiac CT 05/31/2018  FINDINGS: Vascular: Mild aortic atherosclerosis. The included aorta is normal in caliber.  Mediastinum/nodes: No adenopathy or mass. Unremarkable esophagus.  Lungs: No focal airspace disease. Subpleural reticular changes within the anterior right lung typical of post radiation change. 2No pulmonary nodule. No pleural fluid. The included airways are patent.  Upper abdomen: Postsurgical change in the proximal stomach with gastric suture line crossing the diaphragmatic hiatus. Minimal hiatal hernia.  Musculoskeletal: There are no acute or suspicious osseous abnormalities.  IMPRESSION: 1. No acute findings in the chest. 2. Postsurgical change in the proximal stomach with minimal hiatal hernia.  Aortic Atherosclerosis (ICD10-I70.0).   Electronically Signed By: Andrea Gasman M.D. On: 07/28/2023 17:48  Narrative CLINICAL DATA:  Chest pain  EXAM: Cardiac/Coronary CTA  TECHNIQUE: A non-contrast, gated CT scan was obtained with axial slices of 2.5 mm through the heart for calcium scoring. Calcium scoring was performed using the Agatston method. A 120 kV prospective, gated, contrast cardiac CT scan was obtained. Gantry rotation speed was 230 msec and collimation was 0.63 mm. Two sublingual nitroglycerin  tablets (0.8 mg) were given. The 3D data set was reconstructed with motion  correction for the best systolic or diastolic phase. Images were analyzed on a dedicated workstation using MPR, MIP, and VRT modes. The patient received 95 cc of contrast.  FINDINGS: Image quality: Good  Noise artifact is: Limited.  Coronary Arteries:  Normal coronary origin.  Left dominance.  Left main: The left main is a large caliber vessel with a normal take off from the left coronary cusp that bifurcates to form a left anterior descending artery and a left circumflex artery. There is no plaque or stenosis.  Left anterior descending artery: The LAD is patent. The LAD gives off 2 patent diagonal branches. Calcified plaque in proximal LAD causes 0-24% stenosis. Calcified plaque in mid LAD causes 50-69% stenosis. Calcified plaque in distal LAD causes 25-49% stenosis. Noncalcified plaque in D3 causes 50-69% stenosis  Left circumflex artery: The LCX is dominant and patent. The LCX gives off 2 patent obtuse marginal branches. Calcified plaque in proximal LCX causes 0-24% stenosis. Calcified plaque in mid LCX causes 0-24% stenosis. Calcified plaque in distal LCX causes 0-24% stenosis. Mixed plaque in proximal OM1 causes severe (70-99%) stenosis, but small vessel (<31mm)  Right coronary artery: The RCA is nondominant with normal take off from the right coronary cusp. Calcified plaque in proximal RCA causes 25-49% stenosis  Right Atrium: Right atrial size is within normal limits.  Right Ventricle: The right ventricular cavity is within normal limits.  Left Atrium: Left atrial size is mildly enlarged with no left atrial appendage filling defect.  Left Ventricle: The ventricular cavity size is within normal limits.  Pulmonary arteries: Normal in size.  Pulmonary veins: Normal pulmonary venous drainage.  Pericardium: Normal thickness without significant effusion or calcium present.  Cardiac valves: The aortic valve is trileaflet with moderate calcifications (AV calcium score  676). The mitral valve has moderate MAC  Aorta: Normal caliber without significant disease.  Extra-cardiac findings: See attached radiology report for non-cardiac structures.  IMPRESSION: 1. Coronary calcium score of 823. This was 95th percentile for age-, sex, and race-matched controls.  2. Total plaque volume 539mm3 which is 74th percentile for age- and sex-matched controls (calcified plaque 178mm3; non-calcified plaque 35mm3). TPV is severe  3.  Normal coronary origin with left dominance.  4. Severe (70-99%) stenosis in proximal OM1, but small vessel (<3mm)  5.  Moderate (50-69%) stenosis in mid LAD and D3  6.  Mild (25-49%) stenosis in distal LAD and proximal RCA  7. Minimal (0-24%) stenosis in proximal LAD, proximal/mid/distal LCX, and proximal RCA  8. The aortic valve is trileaflet with moderate calcifications (AV calcium score 676).  9.  Moderate mitral annular calcifications  10. Will send study for CTFFR  RECOMMENDATIONS: CAD-RADS 4: Severe stenosis. (70-99% or > 50% left main). CT FFR is recommended. Consider symptom-guided anti-ischemic pharmacotherapy as well as risk factor modification per guideline directed care.  Electronically Signed: By: Lonni Nanas M.D. On: 07/14/2023 14:22   CT SCANS  CT CORONARY FRACTIONAL FLOW RESERVE DATA PREP 06/03/2018  Narrative CLINICAL DATA:  Chest pain  EXAM: CT FFR  MEDICATIONS: No additional medications  TECHNIQUE: The coronary CTA was sent for  FFR.  FINDINGS: FFR mid LAD 0.86  FFR OM1 0.72  IMPRESSION: There is hemodynamically significant stenosis in the proximal OM1. From the CTA, this appeared to be a relatively small caliber vessel.  Dalton Mclean   Electronically Signed By: Ezra Shuck M.D. On: 06/04/2018 11:15   CT CORONARY MORPH W/CTA COR W/SCORE 05/31/2018  Addendum 06/03/2018  1:26 PM ADDENDUM REPORT: 06/03/2018 13:24  CLINICAL DATA:  Chest pain  EXAM: Cardiac  CTA  MEDICATIONS: Sub lingual nitro. 4mg  x 2  TECHNIQUE: The patient was scanned on a Siemens 192 slice scanner. Gantry rotation speed was 250 msecs. Collimation was 0.6 mm. A 100 kV prospective scan was triggered in the ascending thoracic aorta at 35-75% of the R-R interval. Average HR during the scan was 60 bpm. The 3D data set was interpreted on a dedicated work station using MPR, MIP and VRT modes. A total of 80cc of contrast was used.  FINDINGS: Non-cardiac: See separate report from [facility].  Pulmonary veins drain normally to the left atrium.  Calcium Score: 304 Agatston units.  Coronary Arteries: Left dominant with no anomalies  LM: No plaque or stenosis.  LAD system: Mixed plaque proximal LAD, suspect up to 50% stenosis.  Circumflex system: Mixed plaque proximal LCx with mild (<50%) stenosis. Mixed plaque distal LCx with minimal stenosis. Small OM1 with motion artifact noted, mixed plaque proximally, possible moderate (50-69%) stenosis.  RCA system: Small, nondominant RCA. Interpretation complicated by motion artifact.  IMPRESSION: 1. Coronary artery calcium score 304 Agatston units. This places the patient in the 91st percentile for age and gender, suggesting high risk for future cardiac events.  2. Suspect 50% proximal LAD stenosis. Possible moderate stenosis in OM1 but the vessel is small and affected by motion artifact.  Will send for FFR.  Dalton Mclean   Electronically Signed By: Ezra Shuck M.D. On: 06/03/2018 13:24  Narrative EXAM: OVER-READ INTERPRETATION  CT CHEST  The following report is an over-read performed by radiologist Dr. Toribio Aye of [facility] on 05/31/2018. This over-read does not include interpretation of cardiac or coronary anatomy or pathology. The coronary calcium score/coronary CTA interpretation by the cardiologist is attached.  COMPARISON:  None.  FINDINGS: Aortic atherosclerosis.  Within the visualized portions of the thorax there are no suspicious appearing pulmonary nodules or masses, there is no acute consolidative airspace disease, no pleural effusions, no pneumothorax and no lymphadenopathy. Visualized portions of the upper abdomen demonstrates postoperative changes in the stomach, incompletely imaged. There are no aggressive appearing lytic or blastic lesions noted in the visualized portions of the skeleton.  IMPRESSION: 1.  Aortic Atherosclerosis (ICD10-I70.0).  Electronically Signed: By: Toribio Aye M.D. On: 05/31/2018 13:31     ______________________________________________________________________________________________     Recent Labs: 08/20/2023: BUN 20; Creatinine, Ser 0.96; Hemoglobin 11.6; Platelets 247; Potassium 4.0; Sodium 138  Recent Lipid Panel    Component Value Date/Time   CHOL  01/30/2010 0305    184        ATP III CLASSIFICATION:  <200     mg/dL   Desirable  799-760  mg/dL   Borderline High  >=759    mg/dL   High          TRIG 691 (H) 01/30/2010 0305   HDL 44 01/30/2010 0305   CHOLHDL 4.2 01/30/2010 0305   VLDL 62 (H) 01/30/2010 0305   LDLCALC  01/30/2010 0305    78        Total Cholesterol/HDL:CHD Risk Coronary Heart  Disease Risk Table                     Men   Women  1/2 Average Risk   3.4   3.3  Average Risk       5.0   4.4  2 X Average Risk   9.6   7.1  3 X Average Risk  23.4   11.0        Use the calculated Patient Ratio above and the CHD Risk Table to determine the patient's CHD Risk.        ATP III CLASSIFICATION (LDL):  <100     mg/dL   Optimal  899-870  mg/dL   Near or Above                    Optimal  130-159  mg/dL   Borderline  839-810  mg/dL   High  >809     mg/dL   Very High    History of Present Illness    73 year old female with the above past medical history including CAD s/p DES-OM1 in 08/2023, syncope, PSVT, NSVT, hypertension, hyperlipidemia, type 2 diabetes, right breast cancer s/p  lumpectomy and radiation, glaucoma, mild dementia, OSA and obesity.   ETT in 2016 showed no ischemic changes.  Echocardiogram in 02/2018 showed EF 60 to 65%, sclerotic aortic valve with no evidence of aortic valve stenosis.  Coronary CT angiogram in 05/2018 revealed coronary calcium score of 304 (91st percentile), FFR showed hemodynamically significant stenosis in the proximal OM1, given relatively small vessel, medical management was recommended.  Repeat coronary CTA in 06/2023 in the setting of of exertional chest pain, syncope, revealed coronary calcium score of 823 (95th percentile), severe TPV, severe stenosis in the proximal OM1. Cardiac monitor 07/2023 revealed predominantly sinus rhythm, 2 runs of NSVT, longest lasting 8 beats, 16 runs of PSVT, longest lasting 21.4 seconds, rare PACs and PVCs.  She was started on metoprolol . Outpatient cardiac catheterization on 08/19/2023 revealed 90% OM1 stenosis s/p DES, 40% mid LAD stenosis, small nondominant RCA.She was last seen in the office on 09/11/2023 and was stable from a cardiac standpoint.  She reported fleeting chest discomfort at rest, mild dyspnea on exertion.   She presents today for follow-up. Since her last visit    1. CAD: Outpatient cardiac catheterization on 08/19/2023 revealed 90% OM1 stenosis s/p DES, 40% mid LAD stenosis, small nondominant RCA. She notes intermittent fleeting chest discomfort at rest, which she describes as a stabbing pain, lasts less than 1 minute, with associated shortness of breath.  It was resolved spontaneously.  She has not taken nitroglycerin . She reports occasional mild dyspnea on exertion, dependent nonpitting bilateral lower extremity edema.  Euvolemic and well compensated on exam. We discussed possible addition of Imdur , she declines today.  Reviewed ED precautions.  Continue to monitor symptoms.  Echocardiogram pending.  Continue aspirin , Plavix , metoprolol , Zetia .  She plans to start Repatha as below.  She is interested  in cardiac rehab.   2. History of syncope: No recurrence. Cardiac monitor 07/2023 revealed predominantly sinus rhythm, 2 runs of NSVT, longest lasting 8 beats, 16 runs of PSVT, longest lasting 21.4 seconds, rare PACs and PVCs.  Echo pending.  Reviewed ED precautions.   3. PSVT/NSVT: Cardiac monitor 07/2023 revealed predominantly sinus rhythm, 2 runs of NSVT, longest lasting 8 beats, 16 runs of PSVT, longest lasting 21.4 seconds, rare PACs and PVCs.  Denies palpitations.  Stable on metoprolol .   4.  Hypertension: BP actually elevated in office today, improved with recheck.  Continue current antihypertensive regimen.    5. Hyperlipidemia: No recent LDL on file.  She is statin intolerant.  She plans to start Repatha per PCP.   6. Type 2 diabetes/obesity: A1c was 7.0 in 02/2023.  Monitored and managed per PCP.   7. OSA: Not on CPAP.    8. Disposition: Follow-up in Home Medications    Current Outpatient Medications  Medication Sig Dispense Refill   acetaminophen  (TYLENOL ) 325 MG tablet Take 500 mg by mouth every 6 (six) hours as needed.     anastrozole  (ARIMIDEX ) 1 MG tablet Take 1 tablet (1 mg total) by mouth daily. 45 tablet 3   aspirin  81 MG tablet Take 81 mg by mouth daily.     calcium carbonate (OS-CAL) 1250 (500 Ca) MG chewable tablet Chew 1 tablet by mouth daily.     Calcium Carbonate-Vit D-Min (CALCIUM 1200 PO) Take 1,200 mg by mouth 2 (two) times daily.     Cholecalciferol (VITAMIN D -3) 25 MCG (1000 UT) CAPS Take 3,000 Units by mouth daily.     clopidogrel  (PLAVIX ) 75 MG tablet Take 1 tablet (75 mg total) by mouth daily with breakfast. 90 tablet 2   cyanocobalamin  (VITAMIN B12) 1000 MCG tablet Take 1,000 mcg by mouth 2 (two) times daily.     ezetimibe  (ZETIA ) 10 MG tablet Take 10 mg by mouth daily.     insulin  aspart (NOVOLOG ) 100 UNIT/ML injection Inject 5-15 Units into the skin 3 (three) times daily with meals. 5-15 units on sliding scale      insulin  glargine (LANTUS ) 100 UNIT/ML  injection Inject 45 Units into the skin at bedtime.     lisinopril -hydrochlorothiazide  (ZESTORETIC ) 10-12.5 MG tablet Take 1 tablet by mouth daily.     metoprolol  succinate (TOPROL  XL) 25 MG 24 hr tablet Take 1 tablet (25 mg total) by mouth daily. 30 tablet 2   Multiple Vitamins-Minerals (MULTIVITAMIN ADULT PO) Take 1 tablet by mouth daily.     nitroGLYCERIN  (NITROSTAT ) 0.4 MG SL tablet Place 1 tablet (0.4 mg total) under the tongue every 5 (five) minutes as needed. 25 tablet 2   Semaglutide , 1 MG/DOSE, (OZEMPIC , 1 MG/DOSE,) 4 MG/3ML SOPN Inject 1 mg into the skin once a week.     venlafaxine  XR (EFFEXOR -XR) 150 MG 24 hr capsule Take 150 mg by mouth daily.     No current facility-administered medications for this visit.     Review of Systems    ***.  All other systems reviewed and are otherwise negative except as noted above.    Physical Exam    VS:  There were no vitals taken for this visit. , BMI There is no height or weight on file to calculate BMI.     GEN: Well nourished, well developed, in no acute distress. HEENT: normal. Neck: Supple, no JVD, carotid bruits, or masses. Cardiac: RRR, no murmurs, rubs, or gallops. No clubbing, cyanosis, edema.  Radials/DP/PT 2+ and equal bilaterally.  Respiratory:  Respirations regular and unlabored, clear to auscultation bilaterally. GI: Soft, nontender, nondistended, BS + x 4. MS: no deformity or atrophy. Skin: warm and dry, no rash. Neuro:  Strength and sensation are intact. Psych: Normal affect.  Accessory Clinical Findings    ECG personally reviewed by me today -    - no acute changes.   Lab Results  Component Value Date   WBC 7.9 08/20/2023   HGB 11.6 (L) 08/20/2023   HCT 35.2 (  L) 08/20/2023   MCV 95.4 08/20/2023   PLT 247 08/20/2023   Lab Results  Component Value Date   CREATININE 0.96 08/20/2023   BUN 20 08/20/2023   NA 138 08/20/2023   K 4.0 08/20/2023   CL 106 08/20/2023   CO2 22 08/20/2023   Lab Results  Component  Value Date   ALT 10 10/23/2021   AST 13 (L) 10/23/2021   ALKPHOS 76 10/23/2021   BILITOT 0.5 10/23/2021   Lab Results  Component Value Date   CHOL  01/30/2010    184        ATP III CLASSIFICATION:  <200     mg/dL   Desirable  799-760  mg/dL   Borderline High  >=759    mg/dL   High          HDL 44 01/30/2010   LDLCALC  01/30/2010    78        Total Cholesterol/HDL:CHD Risk Coronary Heart Disease Risk Table                     Men   Women  1/2 Average Risk   3.4   3.3  Average Risk       5.0   4.4  2 X Average Risk   9.6   7.1  3 X Average Risk  23.4   11.0        Use the calculated Patient Ratio above and the CHD Risk Table to determine the patient's CHD Risk.        ATP III CLASSIFICATION (LDL):  <100     mg/dL   Optimal  899-870  mg/dL   Near or Above                    Optimal  130-159  mg/dL   Borderline  839-810  mg/dL   High  >809     mg/dL   Very High   TRIG 691 (H) 01/30/2010   CHOLHDL 4.2 01/30/2010    Lab Results  Component Value Date   HGBA1C (H) 01/29/2010    11.7 (NOTE)                                                                       According to the ADA Clinical Practice Recommendations for 2011, when HbA1c is used as a screening test:   >=6.5%   Diagnostic of Diabetes Mellitus           (if abnormal result  is confirmed)  5.7-6.4%   Increased risk of developing Diabetes Mellitus  References:Diagnosis and Classification of Diabetes Mellitus,Diabetes Care,2011,34(Suppl 1):S62-S69 and Standards of Medical Care in         Diabetes - 2011,Diabetes Care,2011,34  (Suppl 1):S11-S61.    Assessment & Plan    1.  ***  No BP recorded.  {Refresh Note OR Click here to enter BP  :1}***   Damien JAYSON Braver, NP 01/21/2024, 6:32 AM

## 2024-01-22 ENCOUNTER — Encounter (HOSPITAL_COMMUNITY)

## 2024-01-22 ENCOUNTER — Telehealth (HOSPITAL_COMMUNITY): Payer: Self-pay

## 2024-01-25 ENCOUNTER — Encounter (HOSPITAL_COMMUNITY)

## 2024-01-27 ENCOUNTER — Encounter (HOSPITAL_COMMUNITY)

## 2024-01-29 ENCOUNTER — Encounter (HOSPITAL_COMMUNITY)

## 2024-02-01 ENCOUNTER — Encounter (HOSPITAL_COMMUNITY)

## 2024-02-03 ENCOUNTER — Encounter (HOSPITAL_COMMUNITY)

## 2024-02-16 ENCOUNTER — Encounter (HOSPITAL_COMMUNITY): Payer: Self-pay | Admitting: *Deleted

## 2024-02-16 DIAGNOSIS — Z955 Presence of coronary angioplasty implant and graft: Secondary | ICD-10-CM

## 2024-02-16 NOTE — Progress Notes (Signed)
 Cardiac Individual Treatment Plan  Patient Details  Name: Miranda Fuller MRN: [id number] Date of Birth: [birth year] Referring Provider:   Flowsheet Row CARDIAC REHAB PHASE II ORIENTATION from 11/03/2023 in [facility]  Referring Provider Verlin Bruckner MD    Initial Encounter Date:  Flowsheet Row CARDIAC REHAB PHASE II ORIENTATION from 11/03/2023 in [facility]  Date 11/03/23    Visit Diagnosis: Status post coronary artery stent placement  Patient's Home Medications on Admission:  Current Outpatient Medications:    acetaminophen  (TYLENOL ) 325 MG tablet, Take 500 mg by mouth every 6 (six) hours as needed., Disp: , Rfl:    anastrozole  (ARIMIDEX ) 1 MG tablet, Take 1 tablet (1 mg total) by mouth daily., Disp: 45 tablet, Rfl: 3   aspirin  81 MG tablet, Take 81 mg by mouth daily., Disp: , Rfl:    calcium carbonate (OS-CAL) 1250 (500 Ca) MG chewable tablet, Chew 1 tablet by mouth daily., Disp: , Rfl:    Calcium Carbonate-Vit D-Min (CALCIUM 1200 PO), Take 1,200 mg by mouth 2 (two) times daily., Disp: , Rfl:    Cholecalciferol (VITAMIN D -3) 25 MCG (1000 UT) CAPS, Take 3,000 Units by mouth daily., Disp: , Rfl:    clopidogrel  (PLAVIX ) 75 MG tablet, Take 1 tablet (75 mg total) by mouth daily with breakfast., Disp: 90 tablet, Rfl: 2   cyanocobalamin  (VITAMIN B12) 1000 MCG tablet, Take 1,000 mcg by mouth 2 (two) times daily., Disp: , Rfl:    ezetimibe  (ZETIA ) 10 MG tablet, Take 10 mg by mouth daily., Disp: , Rfl:    insulin  aspart (NOVOLOG ) 100 UNIT/ML injection, Inject 5-15 Units into the skin 3 (three) times daily with meals. 5-15 units on sliding scale , Disp: , Rfl:    insulin  glargine (LANTUS ) 100 UNIT/ML injection, Inject 45 Units into the skin at bedtime., Disp: , Rfl:    lisinopril -hydrochlorothiazide  (ZESTORETIC ) 10-12.5 MG tablet, Take 1 tablet by mouth daily., Disp: , Rfl:    metoprolol  succinate (TOPROL  XL) 25 MG 24 hr tablet, Take 1 tablet  (25 mg total) by mouth daily., Disp: 30 tablet, Rfl: 2   Multiple Vitamins-Minerals (MULTIVITAMIN ADULT PO), Take 1 tablet by mouth daily., Disp: , Rfl:    nitroGLYCERIN  (NITROSTAT ) 0.4 MG SL tablet, Place 1 tablet (0.4 mg total) under the tongue every 5 (five) minutes as needed., Disp: 25 tablet, Rfl: 2   Semaglutide , 1 MG/DOSE, (OZEMPIC , 1 MG/DOSE,) 4 MG/3ML SOPN, Inject 1 mg into the skin once a week., Disp: , Rfl:    venlafaxine  XR (EFFEXOR -XR) 150 MG 24 hr capsule, Take 150 mg by mouth daily., Disp: , Rfl:   Past Medical History: Past Medical History:  Diagnosis Date   Breast mass 12/27/2018   right- cancer, s/p lumpectomy, radiation   Coccidioidomycosis, pulmonary    Complication of anesthesia    DDD (degenerative disc disease), cervical    Dementia (HCC)    Early per Dr. Vonzell   Depression 2009   DM (diabetes mellitus screen) 2009   Family history of bone cancer    Family history of brain cancer    Family history of breast cancer    Family history of esophageal cancer    Family history of lung cancer    Glaucoma    HTN (hypertension)    Morbid obesity (HCC)    Nodule of upper lobe of left lung 12/27/2018   Personal history of radiation therapy    PONV (postoperative nausea and vomiting)    Sleep apnea  had surgery   Type II diabetes mellitus, uncontrolled 01/31/2019    Tobacco Use: Social History   Tobacco Use  Smoking Status Never  Smokeless Tobacco Never    Labs: Review Flowsheet       Latest Ref Rng & Units 01/29/2010 01/30/2010  Labs for ITP Cardiac and Pulmonary Rehab  Cholestrol 0 - 200 mg/dL - 815        ATP III CLASSIFICATION:  <200     mg/dL   Desirable  799-760  mg/dL   Borderline High  >=759    mg/dL   High          LDL (calc) 0 - 99 mg/dL - 78        Total Cholesterol/HDL:CHD Risk Coronary Heart Disease Risk Table                     Men   Women  1/2 Average Risk   3.4   3.3  Average Risk       5.0   4.4  2 X Average Risk   9.6    7.1  3 X Average Risk  23.4   11.0        Use the calculated Patient Ratio above and the CHD Risk Table to determine the patient's CHD Risk.        ATP III CLASSIFICATION (LDL):  <100     mg/dL   Optimal  899-870  mg/dL   Near or Above                    Optimal  130-159  mg/dL   Borderline  839-810  mg/dL   High  >809     mg/dL   Very High   HDL-C >60 mg/dL - 44   Trlycerides <849 mg/dL - 691   Hemoglobin J8r <5.7 % 11.7 (NOTE)                                                                       According to the ADA Clinical Practice Recommendations for 2011, when HbA1c is used as a screening test:   >=6.5%   Diagnostic of Diabetes Mellitus           (if abnormal result  is confirmed)  5.7-6.4%   Increased risk of developing Diabetes Mellitus  References:Diagnosis and Classification of Diabetes Mellitus,Diabetes Care,2011,34(Suppl 1):S62-S69 and Standards of Medical Care in         Diabetes - 2011,Diabetes Rjmz,7988,65  (Suppl 1):S11-S61.  -    Capillary Blood Glucose: Lab Results  Component Value Date   GLUCAP 115 (H) 11/06/2023   GLUCAP 157 (H) 11/06/2023   GLUCAP 182 (H) 11/04/2023   GLUCAP 149 (H) 08/19/2023   GLUCAP 117 (H) 08/19/2023     Exercise Target Goals: Exercise Program Goal: Individual exercise prescription set using results from initial 6 min walk test and THRR while considering  patient's activity barriers and safety.   Exercise Prescription Goal: Starting with aerobic activity 30 plus minutes a day, 3 days per week for initial exercise prescription. Provide home exercise prescription and guidelines that participant acknowledges understanding prior to discharge.  Activity Barriers & Risk Stratification:  Activity Barriers & Cardiac Risk Stratification -  11/03/23 9162       Activity Barriers & Cardiac Risk Stratification   Activity Barriers Deconditioning;Muscular Weakness;Shortness of Breath;History of Falls;Balance Concerns;Chest Pain/Angina    Cardiac  Risk Stratification High          6 Minute Walk:  6 Minute Walk     Row Name 11/03/23 1005         6 Minute Walk   Phase Initial     Distance 910 feet     Walk Time 6 minutes     # of Rest Breaks 0     MPH 1.72     METS 2.13     RPE 11     VO2 Peak 7.44     Symptoms Yes (comment)     Comments a little dizzy from turns     Resting HR 73 bpm     Resting BP 126/70     Resting Oxygen Saturation  96 %     Exercise Oxygen Saturation  during 6 min walk 96 %     Max Ex. HR 113 bpm     Max Ex. BP 194/84     2 Minute Post BP 186/84        Oxygen Initial Assessment:   Oxygen Re-Evaluation:   Oxygen Discharge (Final Oxygen Re-Evaluation):   Initial Exercise Prescription:  Initial Exercise Prescription - 11/03/23 1000       Date of Initial Exercise RX and Referring Provider   Date 11/03/23    Referring Provider Verlin Bruckner MD      Oxygen   Maintain Oxygen Saturation 88% or higher      Treadmill   MPH 1.2    Grade 0.5    Minutes 15    METs 2      NuStep   Level 1    SPM 80    Minutes 15    METs 2      Prescription Details   Frequency (times per week) 3    Duration Progress to 30 minutes of continuous aerobic without signs/symptoms of physical distress      Intensity   THRR 40-80% of Max Heartrate 103-134    Ratings of Perceived Exertion 11-13    Perceived Dyspnea 0-4      Progression   Progression Continue to progress workloads to maintain intensity without signs/symptoms of physical distress.      Resistance Training   Training Prescription Yes    Weight 3 lb    Reps 10-15          Perform Capillary Blood Glucose checks as needed.  Exercise Prescription Changes:   Exercise Prescription Changes     Row Name 11/03/23 1000             Response to Exercise   Blood Pressure (Admit) 126/70       Blood Pressure (Exercise) 194/84       Blood Pressure (Exit) 162/74       Heart Rate (Admit) 73 bpm       Heart Rate (Exercise)  113 bpm       Heart Rate (Exit) 79 bpm       Oxygen Saturation (Admit) 96 %       Oxygen Saturation (Exercise) 96 %       Rating of Perceived Exertion (Exercise) 11       Symptoms a little dizzy from turns       Comments walk test results  Exercise Comments:   Exercise Comments     Row Name 11/04/23 1256           Exercise Comments First full day of exercise!  Patient was oriented to gym and equipment including functions, settings, policies, and procedures.  Patient's individual exercise prescription and treatment plan were reviewed.  All starting workloads were established based on the results of the 6 minute walk test done at initial orientation visit.  The plan for exercise progression was also introduced and progression will be customized based on patient's performance and goals.          Exercise Goals and Review:   Exercise Goals     Row Name 11/03/23 1007             Exercise Goals   Increase Physical Activity Yes       Intervention Provide advice, education, support and counseling about physical activity/exercise needs.;Develop an individualized exercise prescription for aerobic and resistive training based on initial evaluation findings, risk stratification, comorbidities and participant's personal goals.       Expected Outcomes Short Term: Attend rehab on a regular basis to increase amount of physical activity.;Long Term: Exercising regularly at least 3-5 days a week.;Long Term: Add in home exercise to make exercise part of routine and to increase amount of physical activity.       Increase Strength and Stamina Yes       Intervention Provide advice, education, support and counseling about physical activity/exercise needs.;Develop an individualized exercise prescription for aerobic and resistive training based on initial evaluation findings, risk stratification, comorbidities and participant's personal goals.       Expected Outcomes Short Term: Increase  workloads from initial exercise prescription for resistance, speed, and METs.;Short Term: Perform resistance training exercises routinely during rehab and add in resistance training at home;Long Term: Improve cardiorespiratory fitness, muscular endurance and strength as measured by increased METs and functional capacity ( )       Able to understand and use rate of perceived exertion (RPE) scale Yes       Intervention Provide education and explanation on how to use RPE scale       Expected Outcomes Short Term: Able to use RPE daily in rehab to express subjective intensity level;Long Term:  Able to use RPE to guide intensity level when exercising independently       Able to understand and use Dyspnea scale Yes       Intervention Provide education and explanation on how to use Dyspnea scale       Expected Outcomes Short Term: Able to use Dyspnea scale daily in rehab to express subjective sense of shortness of breath during exertion;Long Term: Able to use Dyspnea scale to guide intensity level when exercising independently       Knowledge and understanding of Target Heart Rate Range (THRR) Yes       Intervention Provide education and explanation of THRR including how the numbers were predicted and where they are located for reference       Expected Outcomes Short Term: Able to state/look up THRR;Long Term: Able to use THRR to govern intensity when exercising independently;Short Term: Able to use daily as guideline for intensity in rehab       Able to check pulse independently Yes       Intervention Provide education and demonstration on how to check pulse in carotid and radial arteries.;Review the importance of being able to check your own pulse for safety during independent exercise  Expected Outcomes Short Term: Able to explain why pulse checking is important during independent exercise;Long Term: Able to check pulse independently and accurately       Understanding of Exercise Prescription Yes        Intervention Provide education, explanation, and written materials on patient's individual exercise prescription       Expected Outcomes Short Term: Able to explain program exercise prescription;Long Term: Able to explain home exercise prescription to exercise independently          Exercise Goals Re-Evaluation :  Exercise Goals Re-Evaluation     Row Name 11/04/23 1257             Exercise Goal Re-Evaluation   Exercise Goals Review Understanding of Exercise Prescription;Able to understand and use Dyspnea scale;Able to understand and use rate of perceived exertion (RPE) scale;Knowledge and understanding of Target Heart Rate Range (THRR)       Comments Reviewed RPE and dyspnea scale, THR and program prescription with pt today.  Pt voiced understanding and was given a copy of goals to take home.       Expected Outcomes Short: Use RPE daily to regulate intensity.  Long: Follow program prescription in THR.           Discharge Exercise Prescription (Final Exercise Prescription Changes):  Exercise Prescription Changes - 11/03/23 1000       Response to Exercise   Blood Pressure (Admit) 126/70    Blood Pressure (Exercise) 194/84    Blood Pressure (Exit) 162/74    Heart Rate (Admit) 73 bpm    Heart Rate (Exercise) 113 bpm    Heart Rate (Exit) 79 bpm    Oxygen Saturation (Admit) 96 %    Oxygen Saturation (Exercise) 96 %    Rating of Perceived Exertion (Exercise) 11    Symptoms a little dizzy from turns    Comments walk test results          Nutrition:  Target Goals: Understanding of nutrition guidelines, daily intake of sodium 1500mg , cholesterol 200mg , calories 30% from fat and 7% or less from saturated fats, daily to have 5 or more servings of fruits and vegetables.  Biometrics:  Pre Biometrics - 11/03/23 1008       Pre Biometrics   Height 5' (1.524 m)    Weight 201 lb 14.4 oz (91.6 kg)    Waist Circumference 38 inches    Hip Circumference 41 inches    Waist to Hip  Ratio 0.93 %    BMI (Calculated) 39.43    Grip Strength 21.1 kg    Single Leg Stand 30 seconds           Nutrition Therapy Plan and Nutrition Goals:  Nutrition Therapy & Goals - 11/03/23 0846       Intervention Plan   Intervention Prescribe, educate and counsel regarding individualized specific dietary modifications aiming towards targeted core components such as weight, hypertension, lipid management, diabetes, heart failure and other comorbidities.;Nutrition handout(s) given to patient.    Expected Outcomes Short Term Goal: Understand basic principles of dietary content, such as calories, fat, sodium, cholesterol and nutrients.;Long Term Goal: Adherence to prescribed nutrition plan.          Nutrition Assessments:  MEDIFICTS Score Key: >=70 Need to make dietary changes  40-70 Heart Healthy Diet <= 40 Therapeutic Level Cholesterol Diet  Flowsheet Row CARDIAC REHAB PHASE II ORIENTATION from 11/03/2023 in [facility]  Picture Your Plate Total Score on Admission 68  Picture Your Plate Scores: <59 Unhealthy dietary pattern with much room for improvement. 41-50 Dietary pattern unlikely to meet recommendations for good health and room for improvement. 51-60 More healthful dietary pattern, with some room for improvement.  >60 Healthy dietary pattern, although there may be some specific behaviors that could be improved.    Nutrition Goals Re-Evaluation:   Nutrition Goals Discharge (Final Nutrition Goals Re-Evaluation):   Psychosocial: Target Goals: Acknowledge presence or absence of significant depression and/or stress, maximize coping skills, provide positive support system. Participant is able to verbalize types and ability to use techniques and skills needed for reducing stress and depression.  Initial Review & Psychosocial Screening:  Initial Psych Review & Screening - 11/03/23 0842       Initial Review   Current issues with Current  Depression;Current Psychotropic Meds;Current Sleep Concerns;Current Stress Concerns    Source of Stress Concerns Chronic Illness;Unable to participate in former interests or hobbies;Unable to perform yard/household activities    Comments sleep good some days and not other days, lives alone, generic effexor  has crazy dreams when she misses a dose and plans to talk to doctor about it      Family Dynamics   Good Support System? Yes   oldest daughter lives in town and one daughter in [place]     Barriers   Psychosocial barriers to participate in program Psychosocial barriers identified (see note);The patient should benefit from training in stress management and relaxation.      Screening Interventions   Interventions Encouraged to exercise;To provide support and resources with identified psychosocial needs;Provide feedback about the scores to participant    Expected Outcomes Short Term goal: Utilizing psychosocial counselor, staff and physician to assist with identification of specific Stressors or current issues interfering with healing process. Setting desired goal for each stressor or current issue identified.;Long Term Goal: Stressors or current issues are controlled or eliminated.;Long Term goal: The participant improves quality of Life and PHQ9 Scores as seen by post scores and/or verbalization of changes;Short Term goal: Identification and review with participant of any Quality of Life or Depression concerns found by scoring the questionnaire.          Quality of Life Scores:  Quality of Life - 11/04/23 1256       Quality of Life   Select Quality of Life      Quality of Life Scores   Health/Function Pre 14.57 %    Socioeconomic Pre 21 %    Psych/Spiritual Pre 23.79 %    Family Pre 15.75 %    GLOBAL Pre 18.03 %         Scores of 19 and below usually indicate a poorer quality of life in these areas.  A difference of  2-3 points is a clinically meaningful difference.  A  difference of 2-3 points in the total score of the Quality of Life Index has been associated with significant improvement in overall quality of life, self-image, physical symptoms, and general health in studies assessing change in quality of life.  PHQ-9: Review Flowsheet       11/03/2023  Depression screen PHQ 2/9  Decreased Interest 2  Down, Depressed, Hopeless 3  PHQ - 2 Score 5  Altered sleeping 3  Tired, decreased energy 3  Change in appetite 2  Feeling bad or failure about yourself  0  Trouble concentrating 0  Moving slowly or fidgety/restless 0  Suicidal thoughts 0  PHQ-9 Score 13   Difficult doing work/chores Very difficult  Details       Data saved with a previous flowsheet row definition        Interpretation of Total Score  Total Score Depression Severity:  1-4 = Minimal depression, 5-9 = Mild depression, 10-14 = Moderate depression, 15-19 = Moderately severe depression, 20-27 = Severe depression   Psychosocial Evaluation and Intervention:  Psychosocial Evaluation - 11/03/23 1008       Psychosocial Evaluation & Interventions   Interventions Stress management education;Relaxation education;Encouraged to exercise with the program and follow exercise prescription    Comments Denielle is coming into cardiac rehab after a stent.  Prior to her stent she had had a syncopal episode and they believe the two may be linked.  She has also been experiencing high blood pressure reading recently and plans to discuss with her PCP tomorrow.  This will be her last appt with current PCP as he is retiring and she will be sad to see him go. They are also going to look at adding in renexa with her ongoing intermitten chest pain.  She has a history of bilateral breast cancer and a lung nodule.  She has not been very active recently and looking forward to getting up and moving again. She was to feel better and build up her energy.  She is also hoping that it will help her sleep better.  She  does also note a history of depression and takes generic effexor  but finds that if she misses a dose she will have very weird and vivid dreams.  She plans to discuss this with her doctor as well.  She does not preceive any barriers to attending rehab at this time.  She lives alone but has two daughters; one lives in [place] and one in [place] so they are nearby.  Her biggest concern is making sure her sugar stays where it needs to be and she will check it to make sure she is good to go.    Expected Outcomes Short; Talk to doctor about meds Long: Attend rehab to build up stamina    Continue Psychosocial Services  Follow up required by staff          Psychosocial Re-Evaluation:   Psychosocial Discharge (Final Psychosocial Re-Evaluation):   Vocational Rehabilitation: Provide vocational rehab assistance to qualifying candidates.   Vocational Rehab Evaluation & Intervention:  Vocational Rehab - 11/03/23 0842       Initial Vocational Rehab Evaluation & Intervention   Assessment shows need for Vocational Rehabilitation No   retired         Education: Education Goals: Education classes will be provided on a weekly basis, covering required topics. Participant will state understanding/return demonstration of topics presented.  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 Discus how high blood pressure affects the body and ways to reduce high blood pressure.   Exercise and Your Heart -Discuss why it is important to exercise, the FITT principles of exercise, normal and abnormal responses to exercise, and how to exercise  safely.   Angina -Discuss definition of angina, causes of angina, treatment of angina, and how to decrease risk of having angina.   Cardiac Medications -Review what the following  cardiac medications are used for, how they affect the body, and side effects that may occur when taking the medications.  Medications include Aspirin , Beta blockers, calcium channel blockers, ACE Inhibitors, angiotensin receptor blockers, diuretics, digoxin, and antihyperlipidemics.   Congestive Heart Failure -Discuss the definition of CHF, how to live with CHF, the signs and symptoms of CHF, and how keep track of weight and sodium intake.   Heart Disease and Intimacy -Discus the effect sexual activity has on the heart, how changes occur during intimacy as we age, and safety during sexual activity.   Smoking Cessation / COPD -Discuss different methods to quit smoking, the health benefits of quitting smoking, and the definition of COPD.   Nutrition I: Fats -Discuss the types of cholesterol, what cholesterol does to the heart, and how cholesterol levels can be controlled.   Nutrition II: Labels -Discuss the different components of food labels and how to read food label   Heart Parts/Heart Disease and PAD -Discuss the anatomy of the heart, the pathway of blood circulation through the heart, and these are affected by heart disease.   Stress I: Signs and Symptoms -Discuss the causes of stress, how stress may lead to anxiety and depression, and ways to limit stress.   Stress II: Relaxation -Discuss different types of relaxation techniques to limit stress.   Warning Signs of Stroke / TIA -Discuss definition of a stroke, what the signs and symptoms are of a stroke, and how to identify when someone is having stroke.   Knowledge Questionnaire Score:  Knowledge Questionnaire Score - 11/04/23 1256       Knowledge Questionnaire Score   Pre Score 25/26          Core Components/Risk Factors/Patient  Goals at Admission:  Personal Goals and Risk Factors at Admission - 11/03/23 1015       Core Components/Risk Factors/Patient Goals on Admission    Weight Management Yes;Obesity;Weight Loss    Intervention Weight Management: Develop a combined nutrition and exercise program designed to reach desired caloric intake, while maintaining appropriate intake of nutrient and fiber, sodium and fats, and appropriate energy expenditure required for the weight goal.;Weight Management: Provide education and appropriate resources to help participant work on and attain dietary goals.;Weight Management/Obesity: Establish reasonable short term and long term weight goals.;Obesity: Provide education and appropriate resources to help participant work on and attain dietary goals.    Admit Weight 201 lb 14.4 oz (91.6 kg)    Goal Weight: Short Term 195 lb (88.5 kg)    Goal Weight: Long Term 190 lb (86.2 kg)    Expected Outcomes Short Term: Continue to assess and modify interventions until short term weight is achieved;Long Term: Adherence to nutrition and physical activity/exercise program aimed toward attainment of established weight goal;Weight Loss: Understanding of general recommendations for a balanced deficit meal plan, which promotes 1-2 lb weight loss per week and includes a negative energy balance of [phone] kcal/d;Understanding recommendations for meals to include 15-35% energy as protein, 25-35% energy from fat, 35-60% energy from carbohydrates, less than 200mg  of dietary cholesterol, 20-35 gm of total fiber daily;Understanding of distribution of calorie intake throughout the day with the consumption of 4-5 meals/snacks    Improve shortness of breath with ADL's Yes    Intervention Provide education, individualized exercise plan and daily activity instruction to help decrease symptoms of SOB with activities of daily living.    Expected Outcomes Short Term: Improve cardiorespiratory fitness to achieve a reduction of  symptoms when performing ADLs;Long  Term: Be able to perform more ADLs without symptoms or delay the onset of symptoms    Diabetes Yes    Intervention Provide education about signs/symptoms and action to take for hypo/hyperglycemia.;Provide education about proper nutrition, including hydration, and aerobic/resistive exercise prescription along with prescribed medications to achieve blood glucose in normal ranges: Fasting glucose 65-99 mg/dL    Expected Outcomes Short Term: Participant verbalizes understanding of the signs/symptoms and immediate care of hyper/hypoglycemia, proper foot care and importance of medication, aerobic/resistive exercise and nutrition plan for blood glucose control.;Long Term: Attainment of HbA1C < 7%.    Hypertension Yes    Intervention Provide education on lifestyle modifcations including regular physical activity/exercise, weight management, moderate sodium restriction and increased consumption of fresh fruit, vegetables, and low fat dairy, alcohol moderation, and smoking cessation.;Monitor prescription use compliance.    Expected Outcomes Short Term: Continued assessment and intervention until BP is < 140/1mm HG in hypertensive participants. < 130/75mm HG in hypertensive participants with diabetes, heart failure or chronic kidney disease.;Long Term: Maintenance of blood pressure at goal levels.    Lipids Yes    Intervention Provide education and support for participant on nutrition & aerobic/resistive exercise along with prescribed medications to achieve LDL 70mg , HDL >40mg .    Expected Outcomes Long Term: Cholesterol controlled with medications as prescribed, with individualized exercise RX and with personalized nutrition plan. Value goals: LDL < 70mg , HDL > 40 mg.;Short Term: Participant states understanding of desired cholesterol values and is compliant with medications prescribed. Participant is following exercise prescription and nutrition guidelines.          Core  Components/Risk Factors/Patient Goals Review:    Core Components/Risk Factors/Patient Goals at Discharge (Final Review):    ITP Comments:  ITP Comments     Row Name 11/03/23 0956 11/04/23 0949 11/25/23 1424 12/16/23 0924 12/23/23 0945   ITP Comments Patient attend orientation today.  Patient is attending Cardiac Rehabilitation Program.  Documentation for diagnosis can be found in [facility] 08/19/23.  Reviewed medical chart, RPE/RPD, gym safety, and program guidelines.  Patient was fitted to equipment they will be using during rehab.  Patient is scheduled to start exercise on Wednesday 11/04/23 at 915.   Initial ITP created and sent for review and signature by Dr. Dorn Ross, Medical Director for Cardiac Rehabilitation Program. First full day of exercise!  Patient was oriented to gym and equipment including functions, settings, policies, and procedures.  Patient's individual exercise prescription and treatment plan were reviewed.  All starting workloads were established based on the results of the 6 minute walk test done at initial orientation visit.  The plan for exercise progression was also introduced and progression will be customized based on patient's performance and goals. 30 day review completed. ITP sent to Dr. Dorn Ross, Medical Director of Cardiac Rehab. Continue with ITP unless changes are made by physician.  Still newer to program and has been out sick recently. Arwilda called to let us  know that she is still intolerant to her new meds.  She is very dizzy and lightheaded and feels unable to drive.  She was encouraged to call the cardiology office again to let them know she is still feeling this way. 30 day review completed. ITP sent to Dr. Dorn Ross, Medical Director of Cardiac Rehab. Continue with ITP unless changes are made by physician.  Pt has been out sick, no goals obtained.    Row Name 12/28/23 [id number] 12/28/23 0954 01/20/24 0803 02/16/24 0924     ITP Comments  Called patient  regarding attendance. She continues to be dizzy and fell last week on her porch. She now has hip and shoulder pain and is using a walker. She has not contacted her cardiology regarding the dizziness because she says she had dizziness before she started the new medication. She does plan to return but does not know when. Called patient regarding attendance. She continues to be dizzy and fell last week on her porch. She now has hip and shoulder pain and is using a walker. She has not contacted her cardiology regarding the dizziness because she says she had dizziness before she started the new medication. She does plan to return but does not know when. 30 day review completed. ITP sent to Dr. Dorn Ross, Medical Director of Cardiac Rehab. Continue with ITP unless changes are made by physician. Patient has no goals due to attendance and being out sick. 30 day review completed. ITP sent to Dr. Dorn Ross, Medical Director of Cardiac Rehab. Continue with ITP unless changes are made by physician. Patient has no goals due to attendance and had requested hold til December.       Comments: 30 day review

## 2024-02-19 ENCOUNTER — Telehealth (HOSPITAL_COMMUNITY): Payer: Self-pay

## 2024-02-19 NOTE — Telephone Encounter (Signed)
 Left voice message about continuing with rehab since she has missed visits due to illness.

## 2024-02-26 ENCOUNTER — Ambulatory Visit: Admitting: Nurse Practitioner

## 2024-03-01 NOTE — Telephone Encounter (Signed)
 Called Miranda Fuller to check on her due to missing class. States she wasn't feeling good this morning and was just resting today.  Outgoing call

## 2024-03-01 NOTE — Telephone Encounter (Signed)
 Called patient to address missing appointments. States she is still sick, would like for us  to put her on medical hold until the end of November and then check back in with her. If she is not feeling better by then, she states we can D/C her.  Outgoing call

## 2024-03-01 NOTE — Telephone Encounter (Signed)
 Call Answered - Called patient to check on her and see if she is feeling better and planning on coming to class. She states she fell this past weekend and will not be here all week. Hopeful to start back next week.

## 2024-03-03 ENCOUNTER — Encounter (HOSPITAL_COMMUNITY): Payer: Self-pay

## 2024-03-03 DIAGNOSIS — Z955 Presence of coronary angioplasty implant and graft: Secondary | ICD-10-CM

## 2024-03-03 NOTE — Progress Notes (Signed)
 Cardiac Individual Treatment Plan  Patient Details  Name: Miranda Fuller MRN: [id number] Date of Birth: Dec 18, 1950 Referring Provider:   Flowsheet Row CARDIAC REHAB PHASE II ORIENTATION from 11/03/2023 in [place] [facility]  Referring Provider Verlin Bruckner MD    Initial Encounter Date:  Flowsheet Row CARDIAC REHAB PHASE II ORIENTATION from 11/03/2023 in [place] [place] [facility]  Date 11/03/23    Visit Diagnosis: Status post coronary artery stent placement  Patient's Home Medications on Admission: Current Medications[1]  Past Medical History: Past Medical History:  Diagnosis Date   Breast mass 12/27/2018   right- cancer, s/p lumpectomy, radiation   Coccidioidomycosis, pulmonary    Complication of anesthesia    DDD (degenerative disc disease), cervical    Dementia (HCC)    Early per Dr. Vonzell   Depression 2009   DM (diabetes mellitus screen) 2009   Family history of bone cancer    Family history of brain cancer    Family history of breast cancer    Family history of esophageal cancer    Family history of lung cancer    Glaucoma    HTN (hypertension)    Morbid obesity (HCC)    Nodule of upper lobe of left lung 12/27/2018   Personal history of radiation therapy    PONV (postoperative nausea and vomiting)    Sleep apnea    had surgery   Type II diabetes mellitus, uncontrolled 01/31/2019    Tobacco Use: Tobacco Use History[2]  Labs: Review Flowsheet       Latest Ref Rng & Units 01/29/2010 01/30/2010  Labs for ITP Cardiac and Pulmonary Rehab  Cholestrol 0 - 200 mg/dL - 815        ATP III CLASSIFICATION:  <200     mg/dL   Desirable  799-760  mg/dL   Borderline High  >=759    mg/dL   High          LDL (calc) 0 - 99 mg/dL - 78        Total Cholesterol/HDL:CHD Risk Coronary Heart Disease Risk Table                     Men   Women  1/2 Average Risk   3.4   3.3  Average Risk       5.0   4.4  2 X Average Risk   9.6   7.1  3 X  Average Risk  23.4   11.0        Use the calculated Patient Ratio above and the CHD Risk Table to determine the patient's CHD Risk.        ATP III CLASSIFICATION (LDL):  <100     mg/dL   Optimal  899-870  mg/dL   Near or Above                    Optimal  130-159  mg/dL   Borderline  839-810  mg/dL   High  >809     mg/dL   Very High   HDL-C >60 mg/dL - 44   Trlycerides <849 mg/dL - 691   Hemoglobin J8r <5.7 % 11.7 (NOTE)  According to the ADA Clinical Practice Recommendations for 2011, when HbA1c is used as a screening test:   >=6.5%   Diagnostic of Diabetes Mellitus           (if abnormal result  is confirmed)  5.7-6.4%   Increased risk of developing Diabetes Mellitus  References:Diagnosis and Classification of Diabetes Mellitus,Diabetes Care,2011,34(Suppl 1):S62-S69 and Standards of Medical Care in         Diabetes - 2011,Diabetes Care,2011,34  (Suppl 1):S11-S61.  -     Exercise Target Goals: Exercise Program Goal: Individual exercise prescription set using results from initial 6 min walk test and THRR while considering  patients activity barriers and safety.   Exercise Prescription Goal: Initial exercise prescription builds to 30-45 minutes a day of aerobic activity, 2-3 days per week.  Home exercise guidelines will be given to patient during program as part of exercise prescription that the participant will acknowledge.   Education: Aerobic Exercise: - Group verbal and visual presentation on the components of exercise prescription. Introduces F.I.T.T principle from ACSM for exercise prescriptions.  Reviews F.I.T.T. principles of aerobic exercise including progression. Written material provided at class time.   Education: Resistance Exercise: - Group verbal and visual presentation on the components of exercise prescription. Introduces F.I.T.T principle from ACSM for exercise prescriptions  Reviews F.I.T.T.  principles of resistance exercise including progression. Written material provided at class time.    Education: Exercise & Equipment Safety: - Individual verbal instruction and demonstration of equipment use and safety with use of the equipment.   Education: Exercise Physiology & General Exercise Guidelines: - Group verbal and written instruction with models to review the exercise physiology of the cardiovascular system and associated critical values. Provides general exercise guidelines with specific guidelines to those with heart or lung disease. Written material provided at class time.   Education: Flexibility, Balance, Mind/Body Relaxation: - Group verbal and visual presentation with interactive activity on the components of exercise prescription. Introduces F.I.T.T principle from ACSM for exercise prescriptions. Reviews F.I.T.T. principles of flexibility and balance exercise training including progression. Also discusses the mind body connection.  Reviews various relaxation techniques to help reduce and manage stress (i.e. Deep breathing, progressive muscle relaxation, and visualization). Balance handout provided to take home. Written material provided at class time.   Activity Barriers & Risk Stratification:   6 Minute Walk:   Oxygen Initial Assessment:   Oxygen Re-Evaluation:   Oxygen Discharge (Final Oxygen Re-Evaluation):   Initial Exercise Prescription:   Perform Capillary Blood Glucose checks as needed.  Exercise Prescription Changes:   Exercise Comments:   Exercise Goals and Review:   Exercise Goals Re-Evaluation :   Discharge Exercise Prescription (Final Exercise Prescription Changes):   Nutrition:  Target Goals: Understanding of nutrition guidelines, daily intake of sodium 1500mg , cholesterol 200mg , calories 30% from fat and 7% or less from saturated fats, daily to have 5 or more servings of fruits and vegetables.  Education: Nutrition 1 -Group  instruction provided by verbal, written material, interactive activities, discussions, models, and posters to present general guidelines for heart healthy nutrition including macronutrients, label reading, and promoting whole foods over processed counterparts. Education serves as pensions consultant of discussion of heart healthy eating for all. Written material provided at class time.    Education: Nutrition 2 -Group instruction provided by verbal, written material, interactive activities, discussions, models, and posters to present general guidelines for heart healthy nutrition including sodium, cholesterol, and saturated fat. Providing guidance of habit forming to improve blood pressure, cholesterol, and  body weight. Written material provided at class time.     Biometrics:    Nutrition Therapy Plan and Nutrition Goals:   Nutrition Assessments:  MEDIFICTS Score Key: >=70 Need to make dietary changes  40-70 Heart Healthy Diet <= 40 Therapeutic Level Cholesterol Diet  Flowsheet Row CARDIAC REHAB PHASE II ORIENTATION from 11/03/2023 in [facility]  Picture Your Plate Total Score on Admission 68   Picture Your Plate Scores: <59 Unhealthy dietary pattern with much room for improvement. 41-50 Dietary pattern unlikely to meet recommendations for good health and room for improvement. 51-60 More healthful dietary pattern, with some room for improvement.  >60 Healthy dietary pattern, although there may be some specific behaviors that could be improved.    Nutrition Goals Re-Evaluation:   Nutrition Goals Discharge (Final Nutrition Goals Re-Evaluation):   Psychosocial: Target Goals: Acknowledge presence or absence of significant depression and/or stress, maximize coping skills, provide positive support system. Participant is able to verbalize types and ability to use techniques and skills needed for reducing stress and depression.   Education: Stress, Anxiety, and  Depression - Group verbal and visual presentation to define topics covered.  Reviews how body is impacted by stress, anxiety, and depression.  Also discusses healthy ways to reduce stress and to treat/manage anxiety and depression. Written material provided at class time.   Education: Sleep Hygiene -Provides group verbal and written instruction about how sleep can affect your health.  Define sleep hygiene, discuss sleep cycles and impact of sleep habits. Review good sleep hygiene tips.   Initial Review & Psychosocial Screening:   Quality of Life Scores:   Scores of 19 and below usually indicate a poorer quality of life in these areas.  A difference of  2-3 points is a clinically meaningful difference.  A difference of 2-3 points in the total score of the Quality of Life Index has been associated with significant improvement in overall quality of life, self-image, physical symptoms, and general health in studies assessing change in quality of life.  PHQ-9: Review Flowsheet       11/03/2023  Depression screen PHQ 2/9  Decreased Interest 2  Down, Depressed, Hopeless 3  PHQ - 2 Score 5  Altered sleeping 3  Tired, decreased energy 3  Change in appetite 2  Feeling bad or failure about yourself  0  Trouble concentrating 0  Moving slowly or fidgety/restless 0  Suicidal thoughts 0  PHQ-9 Score 13   Difficult doing work/chores Very difficult    Details       Data saved with a previous flowsheet row definition        Interpretation of Total Score  Total Score Depression Severity:  1-4 = Minimal depression, 5-9 = Mild depression, 10-14 = Moderate depression, 15-19 = Moderately severe depression, 20-27 = Severe depression   Psychosocial Evaluation and Intervention:   Psychosocial Re-Evaluation:   Psychosocial Discharge (Final Psychosocial Re-Evaluation):   Vocational Rehabilitation: Provide vocational rehab assistance to qualifying candidates.   Vocational Rehab Evaluation  & Intervention:   Education: Education Goals: Education classes will be provided on a variety of topics geared toward better understanding of heart health and risk factor modification. Participant will state understanding/return demonstration of topics presented as noted by education test scores.  Learning Barriers/Preferences:   General Cardiac Education Topics:  AED/CPR: - Group verbal and written instruction with the use of models to demonstrate the basic use of the AED with the basic ABC's of resuscitation.   Test and Procedures: -  Group verbal and visual presentation and models provide information about basic cardiac anatomy and function. Reviews the testing methods done to diagnose heart disease and the outcomes of the test results. Describes the treatment choices: Medical Management, Angioplasty, or Coronary Bypass Surgery for treating various heart conditions including Myocardial Infarction, Angina, Valve Disease, and Cardiac Arrhythmias. Written material provided at class time. Flowsheet Row CARDIAC REHAB PHASE II EXERCISE from 11/04/2023 in [place] [facility]  Date 11/04/23  Educator DJ  Instruction Review Code 1- Verbalizes Understanding    Medication Safety: - Group verbal and visual instruction to review commonly prescribed medications for heart and lung disease. Reviews the medication, class of the drug, and side effects. Includes the steps to properly store meds and maintain the prescription regimen. Written material provided at class time.   Intimacy: - Group verbal instruction through game format to discuss how heart and lung disease can affect sexual intimacy. Written material provided at class time.   Know Your Numbers and Heart Failure: - Group verbal and visual instruction to discuss disease risk factors for cardiac and pulmonary disease and treatment options.  Reviews associated critical values for Overweight/Obesity, Hypertension, Cholesterol, and  Diabetes.  Discusses basics of heart failure: signs/symptoms and treatments.  Introduces Heart Failure Zone chart for action plan for heart failure. Written material provided at class time.   Infection Prevention: - Provides verbal and written material to individual with discussion of infection control including proper hand washing and proper equipment cleaning during exercise session.   Falls Prevention: - Provides verbal and written material to individual with discussion of falls prevention and safety.   Other: -Provides group and verbal instruction on various topics (see comments)   Knowledge Questionnaire Score:   Core Components/Risk Factors/Patient Goals at Admission:   Education:Diabetes - Individual verbal and written instruction to review signs/symptoms of diabetes, desired ranges of glucose level fasting, after meals and with exercise. Acknowledge that pre and post exercise glucose checks will be done for 3 sessions at entry of program.   Core Components/Risk Factors/Patient Goals Review:    Core Components/Risk Factors/Patient Goals at Discharge (Final Review):    ITP Comments:  ITP Comments     Row Name 01/20/24 0803 02/16/24 0924         ITP Comments 30 day review completed. ITP sent to Dr. Dorn Ross, Medical Director of Cardiac Rehab. Continue with ITP unless changes are made by physician. Patient has no goals due to attendance and being out sick. 30 day review completed. ITP sent to Dr. Dorn Ross, Medical Director of Cardiac Rehab. Continue with ITP unless changes are made by physician. Patient has no goals due to attendance and had requested hold til December.         Comments: Discharge ITP    [1]  Current Outpatient Medications:    acetaminophen  (TYLENOL ) 325 MG tablet, Take 500 mg by mouth every 6 (six) hours as needed., Disp: , Rfl:    anastrozole  (ARIMIDEX ) 1 MG tablet, Take 1 tablet (1 mg total) by mouth daily., Disp: 45 tablet, Rfl:  3   aspirin  81 MG tablet, Take 81 mg by mouth daily., Disp: , Rfl:    calcium carbonate (OS-CAL) 1250 (500 Ca) MG chewable tablet, Chew 1 tablet by mouth daily., Disp: , Rfl:    Calcium Carbonate-Vit D-Min (CALCIUM 1200 PO), Take 1,200 mg by mouth 2 (two) times daily., Disp: , Rfl:    Cholecalciferol (VITAMIN D -3) 25 MCG (1000 UT) CAPS, Take 3,000 Units  by mouth daily., Disp: , Rfl:    clopidogrel  (PLAVIX ) 75 MG tablet, Take 1 tablet (75 mg total) by mouth daily with breakfast., Disp: 90 tablet, Rfl: 2   cyanocobalamin  (VITAMIN B12) 1000 MCG tablet, Take 1,000 mcg by mouth 2 (two) times daily., Disp: , Rfl:    ezetimibe  (ZETIA ) 10 MG tablet, Take 10 mg by mouth daily., Disp: , Rfl:    insulin  aspart (NOVOLOG ) 100 UNIT/ML injection, Inject 5-15 Units into the skin 3 (three) times daily with meals. 5-15 units on sliding scale , Disp: , Rfl:    insulin  glargine (LANTUS ) 100 UNIT/ML injection, Inject 45 Units into the skin at bedtime., Disp: , Rfl:    lisinopril -hydrochlorothiazide  (ZESTORETIC ) 10-12.5 MG tablet, Take 1 tablet by mouth daily., Disp: , Rfl:    metoprolol  succinate (TOPROL  XL) 25 MG 24 hr tablet, Take 1 tablet (25 mg total) by mouth daily., Disp: 30 tablet, Rfl: 2   Multiple Vitamins-Minerals (MULTIVITAMIN ADULT PO), Take 1 tablet by mouth daily., Disp: , Rfl:    nitroGLYCERIN  (NITROSTAT ) 0.4 MG SL tablet, Place 1 tablet (0.4 mg total) under the tongue every 5 (five) minutes as needed., Disp: 25 tablet, Rfl: 2   Semaglutide , 1 MG/DOSE, (OZEMPIC , 1 MG/DOSE,) 4 MG/3ML SOPN, Inject 1 mg into the skin once a week., Disp: , Rfl:    venlafaxine  XR (EFFEXOR -XR) 150 MG 24 hr capsule, Take 150 mg by mouth daily., Disp: , Rfl:  [2]  Social History Tobacco Use  Smoking Status Never  Smokeless Tobacco Never

## 2024-03-04 NOTE — Telephone Encounter (Signed)
" ° ° [] °  [name]              11/13/2023 09:28 AM EDT by Zina Richerd DASEN, RN  Outgoing [name], [name] (Self) [phone] (Mobile) Remove  Left Message - missed rehab today       "

## 2024-03-04 NOTE — Telephone Encounter (Signed)
" ° ° [] °  My Junie Bookbinder              01/08/2024 09:24 AM EDT by Zina Richerd DASEN, RN  Outgoing [place], [place] (Self) [phone] (Mobile) Remove  Call Answered - Spoke with Ms. Petrakis about missed rehab session today. She is sick with the flu currently. She plans to attend Monday if feeling better.       "

## 2024-03-22 NOTE — Telephone Encounter (Signed)
 Called patient to see if she is planning on coming back to class. She said she has still been a little bit weak. Plans to be here Monday for class.

## 2024-03-24 ENCOUNTER — Other Ambulatory Visit: Payer: Self-pay | Admitting: Hematology and Oncology

## 2024-03-24 DIAGNOSIS — Z9889 Other specified postprocedural states: Secondary | ICD-10-CM

## 2024-03-29 ENCOUNTER — Encounter: Payer: Self-pay | Admitting: Nurse Practitioner

## 2024-03-29 ENCOUNTER — Ambulatory Visit: Attending: Nurse Practitioner | Admitting: Nurse Practitioner

## 2024-03-29 VITALS — BP 134/88 | HR 88 | Ht 59.0 in | Wt 191.0 lb

## 2024-03-29 DIAGNOSIS — I10 Essential (primary) hypertension: Secondary | ICD-10-CM

## 2024-03-29 DIAGNOSIS — Z87898 Personal history of other specified conditions: Secondary | ICD-10-CM

## 2024-03-29 DIAGNOSIS — I2510 Atherosclerotic heart disease of native coronary artery without angina pectoris: Secondary | ICD-10-CM | POA: Diagnosis not present

## 2024-03-29 DIAGNOSIS — E118 Type 2 diabetes mellitus with unspecified complications: Secondary | ICD-10-CM | POA: Diagnosis not present

## 2024-03-29 DIAGNOSIS — Z794 Long term (current) use of insulin: Secondary | ICD-10-CM | POA: Diagnosis not present

## 2024-03-29 DIAGNOSIS — I4729 Other ventricular tachycardia: Secondary | ICD-10-CM

## 2024-03-29 DIAGNOSIS — I471 Supraventricular tachycardia, unspecified: Secondary | ICD-10-CM

## 2024-03-29 DIAGNOSIS — I350 Nonrheumatic aortic (valve) stenosis: Secondary | ICD-10-CM | POA: Diagnosis not present

## 2024-03-29 DIAGNOSIS — E785 Hyperlipidemia, unspecified: Secondary | ICD-10-CM | POA: Diagnosis not present

## 2024-03-29 NOTE — Progress Notes (Signed)
 "  Office Visit    Patient Name: Miranda Fuller Date of Encounter: 03/29/2024  Primary Care Provider:  Atilano Deward ORN, MD Primary Cardiologist:  Lonni Cash, MD  Chief Complaint    74 year old female with a history of CAD s/p DES-OM1 in 08/2023, syncope, PSVT, NSVT, hypertension, hyperlipidemia, type 2 diabetes, right breast cancer s/p lumpectomy and radiation, glaucoma, mild dementia, OSA and obesity who presents for hospital follow-up related to CAD.  Past Medical History    Past Medical History:  Diagnosis Date   Breast mass 12/27/2018   right- cancer, s/p lumpectomy, radiation   Coccidioidomycosis, pulmonary    Complication of anesthesia    DDD (degenerative disc disease), cervical    Dementia (HCC)    Early per Dr. Vonzell   Depression 2009   DM (diabetes mellitus screen) 2009   Family history of bone cancer    Family history of brain cancer    Family history of breast cancer    Family history of esophageal cancer    Family history of lung cancer    Glaucoma    HTN (hypertension)    Morbid obesity (HCC)    Nodule of upper lobe of left lung 12/27/2018   Personal history of radiation therapy    PONV (postoperative nausea and vomiting)    Sleep apnea    had surgery   Type II diabetes mellitus, uncontrolled 01/31/2019   Past Surgical History:  Procedure Laterality Date   ABDOMINAL HYSTERECTOMY     TAH BSO   BREAST BIOPSY Right 02/07/2019   BREAST LUMPECTOMY Right 04/28/2019   BREAST LUMPECTOMY WITH RADIOACTIVE SEED AND SENTINEL LYMPH NODE BIOPSY Left 03/22/2021   Procedure: LEFT BREAST LUMPECTOMY WITH RADIOACTIVE SEED AND SENTINEL LYMPH NODE BIOPSY;  Surgeon: Curvin Deward MOULD, MD;  Location: [facility];  Service: General;  Laterality: Left;   BREAST LUMPECTOMY WITH RADIOACTIVE SEED LOCALIZATION Right 04/28/2019   Procedure: RIGHT BREAST LUMPECTOMY WITH RADIOACTIVE SEED LOCALIZATION;  Surgeon: Curvin Deward MOULD, MD;  Location: [facility];   Service: General;  Laterality: Right;   CHOLECYSTECTOMY     CORONARY ANGIOGRAPHY N/A 08/19/2023   Procedure: CORONARY ANGIOGRAPHY;  Surgeon: Cash Lonni BIRCH, MD;  Location: [facility];  Service: Cardiovascular;  Laterality: N/A;   CORONARY STENT INTERVENTION N/A 08/19/2023   Procedure: CORONARY STENT INTERVENTION;  Surgeon: Cash Lonni BIRCH, MD;  Location: [facility];  Service: Cardiovascular;  Laterality: N/A;   LAPAROSCOPIC GASTRIC SLEEVE RESECTION     TUBAL LIGATION     UVULOPALATOPHARYNGOPLASTY      Allergies  Allergies[1]   Labs/Other Studies Reviewed    The following studies were reviewed today:  Cardiac Studies & Procedures   ______________________________________________________________________________________________ CARDIAC CATHETERIZATION  CARDIAC CATHETERIZATION 08/19/2023  Conclusion   1st Mrg lesion is 99% stenosed.   Mid LAD lesion is 40% stenosed.   A drug-eluting stent was successfully placed using a STENT ONYX FRONTIER 2.25X15.   Post intervention, there is a 0% residual stenosis.  Severe stenosis first obtuse marginal branch Successful PTCA/DES x 1 obtuse marginal branch. Non-obstructive moderate disease in the mid LAD Small non-dominant RCA  Recommendations: DAPT with ASA and Plavix  for at least six months. Monitor overnight given complicated groin access as described above.  Findings Coronary Findings Diagnostic  Dominance: Left  Left Anterior Descending Vessel is large. Mid LAD lesion is 40% stenosed.  Left Circumflex  First Obtuse Marginal Branch Vessel is moderate in size. 1st Mrg lesion is  99% stenosed.  Intervention  1st Mrg lesion Stent CATH VISTA GUIDE 6FR XB3 MULPK guide catheter was inserted. Lesion crossed with guidewire using a WIRE HI TORQ WHISPER MS 190CM. A drug-eluting stent was successfully placed using a STENT ONYX FRONTIER 2.25X15. Stent strut is well apposed. Post-stent angioplasty was not  performed. Post-Intervention Lesion Assessment The intervention was unsuccessful. Pre-interventional TIMI flow is 3. Post-intervention TIMI flow is 3. No complications occurred at this lesion. There is a 0% residual stenosis post intervention.   STRESS TESTS  EXERCISE TOLERANCE TEST (ETT) 12/29/2014  Interpretation Summary  Blood pressure demonstrated a hypotensive response to exercise.  There was no ST segment deviation noted during stress.  ETT with fair exercise tolerance (6:00); no chest pain; hypotensive BP response (SBP documented at 194 stage 1 and 165 stage 2); no ST changes; abnormal ETT due to BP response.   ECHOCARDIOGRAM  ECHOCARDIOGRAM COMPLETE 12/02/2023  Narrative ECHOCARDIOGRAM REPORT    Patient Name:   Miranda Fuller Date of Exam: 12/02/2023 Medical Rec #:  [id number]     Height:       60.0 in Accession #:    [id number]    Weight:       201.9 lb Date of Birth:  12-01-1950     BSA:          1.874 m Patient Age:    73 years      BP:           130/68 mmHg Patient Gender: F             HR:           82 bpm. Exam Location:  [address]  Procedure: 2D Echo and Intracardiac Opacification Agent (Both Spectral and Color Flow Doppler were utilized during procedure).  Indications:    I25.10 Coronary artery disease - Native Vessel  History:        Patient has prior history of Echocardiogram examinations, most recent 02/23/2018. Signs/Symptoms:Chest Pain; Risk Factors:Diabetes, Hypertension and Sleep Apnea. Breast cancer. Obesity.  Sonographer:    Jon Hacker RCS Referring Phys: [id number] CHRISTOPHER D MCALHANY  IMPRESSIONS   1. Left ventricular ejection fraction, by estimation, is 65 to 70%. Left ventricular ejection fraction by PLAX is 68 %. The left ventricle has normal function. The left ventricle has no regional wall motion abnormalities. There is mild concentric left ventricular hypertrophy. Left ventricular diastolic parameters are consistent with Grade I  diastolic dysfunction (impaired relaxation). Elevated left ventricular end-diastolic pressure. The E/e' is 19. 2. Right ventricular systolic function is low normal. The right ventricular size is normal. Tricuspid regurgitation signal is inadequate for assessing PA pressure. The estimated right ventricular systolic pressure is 17.2 mmHg. 3. The mitral valve is degenerative. Trivial mitral valve regurgitation. Mild mitral stenosis. The mean mitral valve gradient is 7.0 mmHg with average heart rate of 73 bpm. Moderate mitral annular calcification. 4. The aortic valve is tricuspid. Aortic valve regurgitation is not visualized. Moderate aortic valve stenosis. Aortic valve area, by VTI measures 1.05 cm. Aortic valve mean gradient measures 15.0 mmHg. Aortic valve Vmax measures 2.55 m/s. Peak gradient 26 mmHg, DI 0.34. 5. The inferior vena cava is normal in size with greater than 50% respiratory variability, suggesting right atrial pressure of 3 mmHg.  Comparison(s): Prior images unable to be directly viewed, comparison made by report only. Changes from prior study are noted. 02/23/2018: LVEF 60-65%, no AS.  FINDINGS Left Ventricle: Left ventricular ejection fraction, by estimation, is 65 to 70%.  Left ventricular ejection fraction by PLAX is 68 %. The left ventricle has normal function. The left ventricle has no regional wall motion abnormalities. The left ventricular internal cavity size was normal in size. There is mild concentric left ventricular hypertrophy. Left ventricular diastolic parameters are consistent with Grade I diastolic dysfunction (impaired relaxation). Elevated left ventricular end-diastolic pressure. The E/e' is 66.  Right Ventricle: The right ventricular size is normal. No increase in right ventricular wall thickness. Right ventricular systolic function is low normal. Tricuspid regurgitation signal is inadequate for assessing PA pressure. The tricuspid regurgitant velocity is 1.88 m/s,  and with an assumed right atrial pressure of 3 mmHg, the estimated right ventricular systolic pressure is 17.2 mmHg.  Left Atrium: Left atrial size was normal in size.  Right Atrium: Right atrial size was not well visualized.  Pericardium: There is no evidence of pericardial effusion.  Mitral Valve: The mitral valve is degenerative in appearance. Moderate mitral annular calcification. Trivial mitral valve regurgitation. Mild mitral valve stenosis. MV peak gradient, 14.7 mmHg. The mean mitral valve gradient is 7.0 mmHg with average heart rate of 73 bpm.  Tricuspid Valve: The tricuspid valve is grossly normal. Tricuspid valve regurgitation is mild.  Aortic Valve: The aortic valve is tricuspid. Aortic valve regurgitation is not visualized. Moderate aortic stenosis is present. Aortic valve mean gradient measures 15.0 mmHg. Aortic valve peak gradient measures 26.0 mmHg. Aortic valve area, by VTI measures 1.05 cm.  Pulmonic Valve: The pulmonic valve was grossly normal. Pulmonic valve regurgitation is trivial.  Aorta: The aortic root and ascending aorta are structurally normal, with no evidence of dilitation.  Venous: The inferior vena cava is normal in size with greater than 50% respiratory variability, suggesting right atrial pressure of 3 mmHg.  IAS/Shunts: No atrial level shunt detected by color flow Doppler.   LEFT VENTRICLE PLAX 2D LV EF:         Left            Diastology ventricular     LV e' medial:    6.42 cm/s ejection        LV E/e' medial:  21.2 fraction by     LV e' lateral:   7.72 cm/s PLAX is 68      LV E/e' lateral: 17.6 %. LVIDd:         4.01 cm LVIDs:         2.52 cm LV PW:         1.12 cm LV IVS:        1.37 cm LVOT diam:     2.00 cm LV SV:         65 LV SV Index:   35 LVOT Area:     3.14 cm   RIGHT VENTRICLE RV S prime:     9.36 cm/s  LEFT ATRIUM             Index LA diam:        3.90 cm 2.08 cm/m LA Vol (A2C):   51.4 ml 27.43 ml/m LA Vol (A4C):    48.5 ml 25.88 ml/m LA Biplane Vol: 49.5 ml 26.41 ml/m AORTIC VALVE AV Area (Vmax):    1.12 cm AV Area (Vmean):   1.05 cm AV Area (VTI):     1.05 cm AV Vmax:           255.00 cm/s AV Vmean:          186.000 cm/s AV VTI:  0.620 m AV Peak Grad:      26.0 mmHg AV Mean Grad:      15.0 mmHg LVOT Vmax:         90.70 cm/s LVOT Vmean:        62.400 cm/s LVOT VTI:          0.208 m LVOT/AV VTI ratio: 0.34  AORTA Ao Root diam: 3.10 cm Ao Asc diam:  3.30 cm  MITRAL VALVE                TRICUSPID VALVE MV Area (PHT): 3.63 cm     TR Peak grad:   14.2 mmHg MV Area VTI:   1.36 cm     TR Vmax:        188.50 cm/s MV Peak grad:  14.7 mmHg MV Mean grad:  7.0 mmHg     SHUNTS MV Vmax:       1.92 m/s     Systemic VTI:  0.21 m MV Vmean:      127.0 cm/s   Systemic Diam: 2.00 cm MV Decel Time: 209 msec MV E velocity: 136.00 cm/s MV A velocity: 184.00 cm/s MV E/A ratio:  0.74  Vinie Maxcy MD Electronically signed by Vinie Maxcy MD Signature Date/Time: 12/03/2023/11:32:10 AM    Final    MONITORS  LONG TERM MONITOR (3-14 DAYS) 07/16/2023  Narrative Patch Wear Time:  13 days and 23 hours (2025-04-07T16:12:13-0400 to 2025-04-21T16:12:07-398)  Sinus rhythm. (min HR of 64 bpm, max HR of 174 bpm, and avg HR of 83 bpm). 2 Ventricular Tachycardia runs occurred, the longest was 8 beats with a max rate of 174 bpm. 16 Supraventricular Tachycardia runs occurred, the longest was 21.4 seconds. Isolated SVEs were rare (<1.0%), SVE Couplets were rare (<1.0%), and SVE Triplets were rare (<1.0%). Isolated VEs were rare (<1.0%), VE Couplets were rare (<1.0%), and no VE Triplets were present.   CT SCANS  CT CORONARY MORPH W/CTA COR W/SCORE 07/14/2023  Addendum 07/28/2023  5:50 PM ADDENDUM REPORT: 07/28/2023 17:48  EXAM: OVER-READ INTERPRETATION  CT CHEST  The following report is an over-read performed by radiologist Dr. Andrea Gasman of [facility] on 07/28/2023.  This over-read does not include interpretation of cardiac or coronary anatomy or pathology. The coronary CTA interpretation by the cardiologist is attached.  COMPARISON:  Cardiac CT 05/31/2018  FINDINGS: Vascular: Mild aortic atherosclerosis. The included aorta is normal in caliber.  Mediastinum/nodes: No adenopathy or mass. Unremarkable esophagus.  Lungs: No focal airspace disease. Subpleural reticular changes within the anterior right lung typical of post radiation change. 2No pulmonary nodule. No pleural fluid. The included airways are patent.  Upper abdomen: Postsurgical change in the proximal stomach with gastric suture line crossing the diaphragmatic hiatus. Minimal hiatal hernia.  Musculoskeletal: There are no acute or suspicious osseous abnormalities.  IMPRESSION: 1. No acute findings in the chest. 2. Postsurgical change in the proximal stomach with minimal hiatal hernia.  Aortic Atherosclerosis (ICD10-I70.0).   Electronically Signed By: Andrea Gasman M.D. On: 07/28/2023 17:48  Narrative CLINICAL DATA:  Chest pain  EXAM: Cardiac/Coronary CTA  TECHNIQUE: A non-contrast, gated CT scan was obtained with axial slices of 2.5 mm through the heart for calcium scoring. Calcium scoring was performed using the Agatston method. A 120 kV prospective, gated, contrast cardiac CT scan was obtained. Gantry rotation speed was 230 msec and collimation was 0.63 mm. Two sublingual nitroglycerin  tablets (0.8 mg) were given. The 3D data set was reconstructed with motion correction for the  best systolic or diastolic phase. Images were analyzed on a dedicated workstation using MPR, MIP, and VRT modes. The patient received 95 cc of contrast.  FINDINGS: Image quality: Good  Noise artifact is: Limited.  Coronary Arteries:  Normal coronary origin.  Left dominance.  Left main: The left main is a large caliber vessel with a normal take off from the left coronary cusp that  bifurcates to form a left anterior descending artery and a left circumflex artery. There is no plaque or stenosis.  Left anterior descending artery: The LAD is patent. The LAD gives off 2 patent diagonal branches. Calcified plaque in proximal LAD causes 0-24% stenosis. Calcified plaque in mid LAD causes 50-69% stenosis. Calcified plaque in distal LAD causes 25-49% stenosis. Noncalcified plaque in D3 causes 50-69% stenosis  Left circumflex artery: The LCX is dominant and patent. The LCX gives off 2 patent obtuse marginal branches. Calcified plaque in proximal LCX causes 0-24% stenosis. Calcified plaque in mid LCX causes 0-24% stenosis. Calcified plaque in distal LCX causes 0-24% stenosis. Mixed plaque in proximal OM1 causes severe (70-99%) stenosis, but small vessel (<20mm)  Right coronary artery: The RCA is nondominant with normal take off from the right coronary cusp. Calcified plaque in proximal RCA causes 25-49% stenosis  Right Atrium: Right atrial size is within normal limits.  Right Ventricle: The right ventricular cavity is within normal limits.  Left Atrium: Left atrial size is mildly enlarged with no left atrial appendage filling defect.  Left Ventricle: The ventricular cavity size is within normal limits.  Pulmonary arteries: Normal in size.  Pulmonary veins: Normal pulmonary venous drainage.  Pericardium: Normal thickness without significant effusion or calcium present.  Cardiac valves: The aortic valve is trileaflet with moderate calcifications (AV calcium score 676). The mitral valve has moderate MAC  Aorta: Normal caliber without significant disease.  Extra-cardiac findings: See attached radiology report for non-cardiac structures.  IMPRESSION: 1. Coronary calcium score of 823. This was 95th percentile for age-, sex, and race-matched controls.  2. Total plaque volume 564mm3 which is 74th percentile for age- and sex-matched controls (calcified plaque  18mm3; non-calcified plaque 367mm3). TPV is severe  3.  Normal coronary origin with left dominance.  4. Severe (70-99%) stenosis in proximal OM1, but small vessel (<53mm)  5.  Moderate (50-69%) stenosis in mid LAD and D3  6.  Mild (25-49%) stenosis in distal LAD and proximal RCA  7. Minimal (0-24%) stenosis in proximal LAD, proximal/mid/distal LCX, and proximal RCA  8. The aortic valve is trileaflet with moderate calcifications (AV calcium score 676).  9.  Moderate mitral annular calcifications  10. Will send study for CTFFR  RECOMMENDATIONS: CAD-RADS 4: Severe stenosis. (70-99% or > 50% left main). CT FFR is recommended. Consider symptom-guided anti-ischemic pharmacotherapy as well as risk factor modification per guideline directed care.  Electronically Signed: By: Lonni Nanas M.D. On: 07/14/2023 14:22   CT SCANS  CT CORONARY FRACTIONAL FLOW RESERVE DATA PREP 06/03/2018  Narrative CLINICAL DATA:  Chest pain  EXAM: CT FFR  MEDICATIONS: No additional medications  TECHNIQUE: The coronary CTA was sent for FFR.  FINDINGS: FFR mid LAD 0.86  FFR OM1 0.72  IMPRESSION: There is hemodynamically significant stenosis in the proximal OM1. From the CTA, this appeared to be a relatively small caliber vessel.  Dalton Mclean   Electronically Signed By: Ezra Shuck M.D. On: 06/04/2018 11:15   CT CORONARY MORPH W/CTA COR W/SCORE 05/31/2018  Addendum 06/03/2018  1:26 PM ADDENDUM REPORT: 06/03/2018 13:24  CLINICAL DATA:  Chest pain  EXAM: Cardiac CTA  MEDICATIONS: Sub lingual nitro. 4mg  x 2  TECHNIQUE: The patient was scanned on a Siemens 192 slice scanner. Gantry rotation speed was 250 msecs. Collimation was 0.6 mm. A 100 kV prospective scan was triggered in the ascending thoracic aorta at 35-75% of the R-R interval. Average HR during the scan was 60 bpm. The 3D data set was interpreted on a dedicated work station using MPR, MIP and VRT modes.  A total of 80cc of contrast was used.  FINDINGS: Non-cardiac: See separate report from [facility].  Pulmonary veins drain normally to the left atrium.  Calcium Score: 304 Agatston units.  Coronary Arteries: Left dominant with no anomalies  LM: No plaque or stenosis.  LAD system: Mixed plaque proximal LAD, suspect up to 50% stenosis.  Circumflex system: Mixed plaque proximal LCx with mild (<50%) stenosis. Mixed plaque distal LCx with minimal stenosis. Small OM1 with motion artifact noted, mixed plaque proximally, possible moderate (50-69%) stenosis.  RCA system: Small, nondominant RCA. Interpretation complicated by motion artifact.  IMPRESSION: 1. Coronary artery calcium score 304 Agatston units. This places the patient in the 91st percentile for age and gender, suggesting high risk for future cardiac events.  2. Suspect 50% proximal LAD stenosis. Possible moderate stenosis in OM1 but the vessel is small and affected by motion artifact.  Will send for FFR.  Dalton Mclean   Electronically Signed By: Ezra Shuck M.D. On: 06/03/2018 13:24  Narrative EXAM: OVER-READ INTERPRETATION  CT CHEST  The following report is an over-read performed by radiologist Dr. Toribio Aye of [facility] on 05/31/2018. This over-read does not include interpretation of cardiac or coronary anatomy or pathology. The coronary calcium score/coronary CTA interpretation by the cardiologist is attached.  COMPARISON:  None.  FINDINGS: Aortic atherosclerosis. Within the visualized portions of the thorax there are no suspicious appearing pulmonary nodules or masses, there is no acute consolidative airspace disease, no pleural effusions, no pneumothorax and no lymphadenopathy. Visualized portions of the upper abdomen demonstrates postoperative changes in the stomach, incompletely imaged. There are no aggressive appearing lytic or blastic lesions noted in the  visualized portions of the skeleton.  IMPRESSION: 1.  Aortic Atherosclerosis (ICD10-I70.0).  Electronically Signed: By: Toribio Aye M.D. On: 05/31/2018 13:31     ______________________________________________________________________________________________     Recent Labs: 08/20/2023: BUN 20; Creatinine, Ser 0.96; Hemoglobin 11.6; Platelets 247; Potassium 4.0; Sodium 138  Recent Lipid Panel    Component Value Date/Time   CHOL  01/30/2010 0305    184        ATP III CLASSIFICATION:  <200     mg/dL   Desirable  799-760  mg/dL   Borderline High  >=759    mg/dL   High          TRIG 691 (H) 01/30/2010 0305   HDL 44 01/30/2010 0305   CHOLHDL 4.2 01/30/2010 0305   VLDL 62 (H) 01/30/2010 0305   LDLCALC  01/30/2010 0305    78        Total Cholesterol/HDL:CHD Risk Coronary Heart Disease Risk Table                     Men   Women  1/2 Average Risk   3.4   3.3  Average Risk       5.0   4.4  2 X Average Risk   9.6   7.1  3 X Average Risk  23.4  11.0        Use the calculated Patient Ratio above and the CHD Risk Table to determine the patient's CHD Risk.        ATP III CLASSIFICATION (LDL):  <100     mg/dL   Optimal  899-870  mg/dL   Near or Above                    Optimal  130-159  mg/dL   Borderline  839-810  mg/dL   High  >809     mg/dL   Very High    History of Present Illness    74 year old female with the above past medical history including CAD s/p DES-OM1 in 08/2023, syncope, PSVT, NSVT, hypertension, hyperlipidemia, type 2 diabetes, right breast cancer s/p lumpectomy and radiation, glaucoma, mild dementia, OSA and obesity.   ETT in 2016 showed no ischemic changes. Echocardiogram in 02/2018 showed EF 60 to 65%, sclerotic aortic valve with no evidence of aortic valve stenosis.  Coronary CT angiogram in 05/2018 revealed coronary calcium score of 304 (91st percentile), FFR showed hemodynamically significant stenosis in the proximal OM1, given relatively small vessel,  medical management was recommended.  Repeat coronary CTA in 06/2023 in the setting of of exertional chest pain, syncope, revealed coronary calcium score of 823 (95th percentile), severe TPV, severe stenosis in the proximal OM1. Cardiac monitor 07/2023 revealed predominantly sinus rhythm, 2 runs of NSVT, longest lasting 8 beats, 16 runs of PSVT, longest lasting 21.4 seconds, rare PACs and PVCs.  She was started on metoprolol .  Outpatient cardiac catheterization in 08/2023 in the setting of exertional chest pain revealed 90% OM1 stenosis s/p DES, 40% mid LAD stenosis, small nondominant RCA.  She was started on DAPT with aspirin  and Plavix .  She was last seen in the office on 09/11/2023 and was stable from a cardiac standpoint.  Echocardiogram in 11/2023 showed EF 65 to 70%, normal LV function, no RWMA, mild concentric LVH, G1 DD, normal RV systolic function, mild mitral stenosis, moderate MAC, trivial mitral valve regurgitation, moderate aortic valve stenosis, mean gradient 15 mmHg.   She presents today for follow-up. Since her last visit she has been stable overall from a cardiac standpoint.  She notes intermittent shortness of breath with activity, though she reports this has been relatively stable for the past 6 months, she will occasionally note episodes of her heart racing in the setting of anxiety. She denies any chest pain, dizziness, presyncope, syncope, edema, PND, orthopnea or weight gain.  Home Medications    Current Outpatient Medications  Medication Sig Dispense Refill   acetaminophen  (TYLENOL ) 325 MG tablet Take 500 mg by mouth every 6 (six) hours as needed.     anastrozole  (ARIMIDEX ) 1 MG tablet Take 1 tablet (1 mg total) by mouth daily. 45 tablet 3   aspirin  81 MG tablet Take 81 mg by mouth daily.     calcium carbonate (OS-CAL) 1250 (500 Ca) MG chewable tablet Chew 1 tablet by mouth daily.     Calcium Carbonate-Vit D-Min (CALCIUM 1200 PO) Take 1,200 mg by mouth 2 (two) times daily.      Cholecalciferol (VITAMIN D -3) 25 MCG (1000 UT) CAPS Take 3,000 Units by mouth daily.     clopidogrel  (PLAVIX ) 75 MG tablet Take 1 tablet (75 mg total) by mouth daily with breakfast. 90 tablet 2   cyanocobalamin  (VITAMIN B12) 1000 MCG tablet Take 1,000 mcg by mouth 2 (two) times daily.     Evolocumab (REPATHA) 140  MG/ML SOSY Inject into the skin every 14 (fourteen) days.     ezetimibe  (ZETIA ) 10 MG tablet Take 10 mg by mouth daily.     insulin  aspart (NOVOLOG ) 100 UNIT/ML injection Inject 5-15 Units into the skin 3 (three) times daily with meals. 5-15 units on sliding scale      insulin  glargine (LANTUS ) 100 UNIT/ML injection Inject 45 Units into the skin at bedtime.     lisinopril -hydrochlorothiazide  (ZESTORETIC ) 10-12.5 MG tablet Take 1 tablet by mouth daily.     Multiple Vitamins-Minerals (MULTIVITAMIN ADULT PO) Take 1 tablet by mouth daily.     nitroGLYCERIN  (NITROSTAT ) 0.4 MG SL tablet Place 1 tablet (0.4 mg total) under the tongue every 5 (five) minutes as needed. 25 tablet 2   Semaglutide , 1 MG/DOSE, (OZEMPIC , 1 MG/DOSE,) 4 MG/3ML SOPN Inject 1 mg into the skin once a week.     venlafaxine  XR (EFFEXOR -XR) 150 MG 24 hr capsule Take 150 mg by mouth daily.     metoprolol  succinate (TOPROL  XL) 25 MG 24 hr tablet Take 1 tablet (25 mg total) by mouth daily. (Patient not taking: Reported on 03/29/2024) 30 tablet 2   No current facility-administered medications for this visit.     Review of Systems    She denies chest pain,, pnd, orthopnea, n, v, dizziness, syncope, edema, weight gain, or early satiety. All other systems reviewed and are otherwise negative except as noted above.   Physical Exam    VS:  BP 134/88   Pulse 88   Ht 4' 11 (1.499 m)   Wt 191 lb (86.6 kg)   SpO2 97%   BMI 38.58 kg/m  GEN: Well nourished, well developed, in no acute distress. HEENT: normal. Neck: Supple, no JVD, carotid bruits, or masses. Cardiac: RRR, 3/6 murmur, no rubs, or gallops. No clubbing, cyanosis,  edema.  Radials/DP/PT 2+ and equal bilaterally.  Respiratory:  Respirations regular and unlabored, clear to auscultation bilaterally. GI: Soft, nontender, nondistended, BS + x 4. MS: no deformity or atrophy. Skin: warm and dry, no rash. Neuro:  Strength and sensation are intact. Psych: Normal affect.  Accessory Clinical Findings    ECG personally reviewed by me today -    - no EKG in office today.    Lab Results  Component Value Date   WBC 7.9 08/20/2023   HGB 11.6 (L) 08/20/2023   HCT 35.2 (L) 08/20/2023   MCV 95.4 08/20/2023   PLT 247 08/20/2023   Lab Results  Component Value Date   CREATININE 0.96 08/20/2023   BUN 20 08/20/2023   NA 138 08/20/2023   K 4.0 08/20/2023   CL 106 08/20/2023   CO2 22 08/20/2023   Lab Results  Component Value Date   ALT 10 10/23/2021   AST 13 (L) 10/23/2021   ALKPHOS 76 10/23/2021   BILITOT 0.5 10/23/2021   Lab Results  Component Value Date   CHOL  01/30/2010    184        ATP III CLASSIFICATION:  <200     mg/dL   Desirable  799-760  mg/dL   Borderline High  >=759    mg/dL   High          HDL 44 01/30/2010   LDLCALC  01/30/2010    78        Total Cholesterol/HDL:CHD Risk Coronary Heart Disease Risk Table                     Men  Women  1/2 Average Risk   3.4   3.3  Average Risk       5.0   4.4  2 X Average Risk   9.6   7.1  3 X Average Risk  23.4   11.0        Use the calculated Patient Ratio above and the CHD Risk Table to determine the patient's CHD Risk.        ATP III CLASSIFICATION (LDL):  <100     mg/dL   Optimal  899-870  mg/dL   Near or Above                    Optimal  130-159  mg/dL   Borderline  839-810  mg/dL   High  >809     mg/dL   Very High   TRIG 691 (H) 01/30/2010   CHOLHDL 4.2 01/30/2010    Lab Results  Component Value Date   HGBA1C (H) 01/29/2010    11.7 (NOTE)                                                                       According to the ADA Clinical Practice Recommendations for 2011,  when HbA1c is used as a screening test:   >=6.5%   Diagnostic of Diabetes Mellitus           (if abnormal result  is confirmed)  5.7-6.4%   Increased risk of developing Diabetes Mellitus  References:Diagnosis and Classification of Diabetes Mellitus,Diabetes Care,2011,34(Suppl 1):S62-S69 and Standards of Medical Care in         Diabetes - 2011,Diabetes Care,2011,34  (Suppl 1):S11-S61.    Assessment & Plan   1. CAD: Outpatient cardiac catheterization on 08/19/2023 revealed 90% OM1 stenosis s/p DES, 40% mid LAD stenosis, small nondominant RCA.  She reports stable intermittent shortness of breath with activity, denies chest pain.  Repeat echo pending as below.  Continue aspirin , Plavix , metoprolol , lisinopril -HCTZ, Zetia , Repatha.   2.  Aortic stenosis:  Echocardiogram in 11/2023 showed EF 65 to 70%, normal LV function, no RWMA, mild concentric LVH, G1 DD, normal RV systolic function, mild mitral stenosis, moderate MAC, trivial mitral valve regurgitation, moderate aortic valve stenosis, mean gradient 15 mmHg.  She reports stable intermittent dyspnea on exertion.  She denies any dizziness, presyncope or syncope.  Euvolemic and well compensated on exam. Given ongoing shortness of breath with activity, will repeat echocardiogram in 05/2024 for monitoring.    3. PSVT/NSVT: Cardiac monitor 07/2023 revealed predominantly sinus rhythm, 2 runs of NSVT, longest lasting 8 beats, 16 runs of PSVT, longest lasting 21.4 seconds, rare PACs and PVCs.  Denies palpitations.  Stable on metoprolol .  4. History of syncope: No recurrence. Cardiac monitor 07/2023 revealed predominantly sinus rhythm, 2 runs of NSVT, longest lasting 8 beats, 16 runs of PSVT, longest lasting 21.4 seconds, rare PACs and PVCs.  Encouraged gradual position changes, adequate hydration.  Reviewed ED precautions.  5. Hypertension: BP actually elevated in office today, improved with recheck.  She has not taken her medication yet this morning.  Continue to  monitor BP.  For now, continue current antihypertensive regimen.    6. Hyperlipidemia: No recent LDL on file.  She has labs scheduled with  her PCP next week.  I have requested that they send us  a copy.  Continue Repatha, Zetia .   7. Type 2 diabetes/obesity: A1c was 7.0 in 02/2023.  Requested copy of most recent labs.  Monitored and managed per PCP.   8. OSA: S/p surgical intervention, no longer on CPAP.   9. Disposition: Follow-up in Dr. Verlin in 06/2024.      Damien JAYSON Braver, NP 03/29/2024, 2:47 PM       [1]  Allergies Allergen Reactions   Oxycodone-Acetaminophen  Other (See Comments), Itching, Rash and Hives    Other reaction(s): Hallucinations   Propoxyphene Other (See Comments), Hives, Itching and Rash    Other reaction(s): Delusions (intolerance)   Demerol [Meperidine] Other (See Comments)    hallucinations   Morphine And Codeine Hives   Oxycodone-Aspirin  Other (See Comments) and Hives   Chlorhexidine  Rash   "

## 2024-03-29 NOTE — Patient Instructions (Signed)
 Medication Instructions:  Your physician recommends that you continue on your current medications as directed. Please refer to the Current Medication list given to you today.  *If you need a refill on your cardiac medications before your next appointment, please call your pharmacy*  Lab Work: NONE ordered at this time of appointment   Testing/Procedures: Your physician has requested that you have an echocardiogram March 2026. Echocardiography is a painless test that uses sound waves to create images of your heart. It provides your doctor with information about the size and shape of your heart and how well your hearts chambers and valves are working. This procedure takes approximately one hour. There are no restrictions for this procedure. Please do NOT wear cologne, perfume, aftershave, or lotions (deodorant is allowed). Please arrive 15 minutes prior to your appointment time.  Please note: We ask at that you not bring children with you during ultrasound (echo/ vascular) testing. Due to room size and safety concerns, children are not allowed in the ultrasound rooms during exams. Our front office staff cannot provide observation of children in our lobby area while testing is being conducted. An adult accompanying a patient to their appointment will only be allowed in the ultrasound room at the discretion of the ultrasound technician under special circumstances. We apologize for any inconvenience.   Follow-Up: At [facility], you and your health needs are our priority.  As part of our continuing mission to provide you with exceptional heart care, our providers are all part of one team.  This team includes your primary Cardiologist (physician) and Advanced Practice Providers or APPs (Physician Assistants and Nurse Practitioners) who all work together to provide you with the care you need, when you need it.  Your next appointment:    April or May 2026  Provider:   Lonni Cash,  MD    We recommend signing up for the patient portal called MyChart.  Sign up information is provided on this After Visit Summary.  MyChart is used to connect with patients for Virtual Visits (Telemedicine).  Patients are able to view lab/test results, encounter notes, upcoming appointments, etc.  Non-urgent messages can be sent to your provider as well.   To learn more about what you can do with MyChart, go to [email or web address].   Other Instructions

## 2024-04-05 NOTE — Telephone Encounter (Signed)
 Ms. Halberg is down with what she says is pleurisy in her right lung. Still not feeling well, will check back in.

## 2024-05-02 ENCOUNTER — Encounter

## 2024-05-04 ENCOUNTER — Ambulatory Visit: Payer: Medicare Other | Admitting: Hematology and Oncology

## 2024-05-30 ENCOUNTER — Ambulatory Visit (HOSPITAL_COMMUNITY)

## 2024-06-30 ENCOUNTER — Ambulatory Visit: Admitting: Cardiovascular Disease
